# Patient Record
Sex: Female | Born: 1940 | Race: White | Hispanic: No | Marital: Married | Smoking: Never smoker
Health system: Southern US, Community
[De-identification: ages and names within clinical notes are randomized; demographics above are authoritative.]

## PROBLEM LIST (undated history)

## (undated) DIAGNOSIS — K219 Gastro-esophageal reflux disease without esophagitis: Secondary | ICD-10-CM

## (undated) DIAGNOSIS — C801 Malignant (primary) neoplasm, unspecified: Secondary | ICD-10-CM

## (undated) DIAGNOSIS — R55 Syncope and collapse: Secondary | ICD-10-CM

## (undated) DIAGNOSIS — E669 Obesity, unspecified: Secondary | ICD-10-CM

## (undated) DIAGNOSIS — R51 Headache: Secondary | ICD-10-CM

## (undated) DIAGNOSIS — M1990 Unspecified osteoarthritis, unspecified site: Secondary | ICD-10-CM

## (undated) DIAGNOSIS — E039 Hypothyroidism, unspecified: Secondary | ICD-10-CM

## (undated) DIAGNOSIS — I10 Essential (primary) hypertension: Secondary | ICD-10-CM

## (undated) DIAGNOSIS — D649 Anemia, unspecified: Secondary | ICD-10-CM

## (undated) DIAGNOSIS — H9190 Unspecified hearing loss, unspecified ear: Secondary | ICD-10-CM

## (undated) DIAGNOSIS — I2510 Atherosclerotic heart disease of native coronary artery without angina pectoris: Secondary | ICD-10-CM

## (undated) HISTORY — DX: Unspecified osteoarthritis, unspecified site: M19.90

## (undated) HISTORY — PX: CATARACT EXTRACTION, BILATERAL: SHX1313

## (undated) HISTORY — DX: Obesity, unspecified: E66.9

## (undated) HISTORY — DX: Syncope and collapse: R55

## (undated) HISTORY — DX: Headache: R51

## (undated) HISTORY — DX: Hypothyroidism, unspecified: E03.9

## (undated) HISTORY — PX: CARPAL TUNNEL RELEASE: SHX101

## (undated) HISTORY — PX: ABDOMINAL HYSTERECTOMY: SHX81

## (undated) HISTORY — PX: OTHER SURGICAL HISTORY: SHX169

## (undated) HISTORY — PX: TONSILLECTOMY: SUR1361

## (undated) HISTORY — PX: LUMBAR SPINE SURGERY: SHX701

## (undated) HISTORY — PX: CERVICAL SPINE SURGERY: SHX589

---

## 1998-10-26 ENCOUNTER — Other Ambulatory Visit: Admission: RE | Admit: 1998-10-26 | Discharge: 1998-10-26 | Payer: Self-pay | Admitting: Obstetrics and Gynecology

## 1999-08-20 ENCOUNTER — Emergency Department (HOSPITAL_COMMUNITY): Admission: EM | Admit: 1999-08-20 | Discharge: 1999-08-20 | Payer: Self-pay

## 1999-11-01 ENCOUNTER — Other Ambulatory Visit: Admission: RE | Admit: 1999-11-01 | Discharge: 1999-11-01 | Payer: Self-pay | Admitting: Obstetrics and Gynecology

## 2000-11-13 ENCOUNTER — Other Ambulatory Visit: Admission: RE | Admit: 2000-11-13 | Discharge: 2000-11-13 | Payer: Self-pay | Admitting: Obstetrics and Gynecology

## 2001-06-18 ENCOUNTER — Ambulatory Visit (HOSPITAL_COMMUNITY): Admission: RE | Admit: 2001-06-18 | Discharge: 2001-06-18 | Payer: Self-pay | Admitting: Gastroenterology

## 2001-10-25 ENCOUNTER — Encounter: Admission: RE | Admit: 2001-10-25 | Discharge: 2001-11-23 | Payer: Self-pay | Admitting: Neurosurgery

## 2001-11-19 ENCOUNTER — Other Ambulatory Visit: Admission: RE | Admit: 2001-11-19 | Discharge: 2001-11-19 | Payer: Self-pay | Admitting: Obstetrics and Gynecology

## 2001-12-18 ENCOUNTER — Ambulatory Visit (HOSPITAL_COMMUNITY): Admission: RE | Admit: 2001-12-18 | Discharge: 2001-12-18 | Payer: Self-pay | Admitting: Neurosurgery

## 2002-01-24 ENCOUNTER — Observation Stay (HOSPITAL_COMMUNITY): Admission: RE | Admit: 2002-01-24 | Discharge: 2002-01-25 | Payer: Self-pay | Admitting: Neurosurgery

## 2002-10-30 ENCOUNTER — Ambulatory Visit (HOSPITAL_COMMUNITY): Admission: RE | Admit: 2002-10-30 | Discharge: 2002-10-30 | Payer: Self-pay | Admitting: Family Medicine

## 2002-10-30 ENCOUNTER — Encounter: Payer: Self-pay | Admitting: Family Medicine

## 2002-11-13 ENCOUNTER — Encounter: Admission: RE | Admit: 2002-11-13 | Discharge: 2002-11-13 | Payer: Self-pay | Admitting: Specialist

## 2002-11-13 ENCOUNTER — Encounter: Payer: Self-pay | Admitting: Specialist

## 2004-07-21 ENCOUNTER — Encounter: Admission: RE | Admit: 2004-07-21 | Discharge: 2004-07-21 | Payer: Self-pay | Admitting: Specialist

## 2004-09-06 ENCOUNTER — Ambulatory Visit (HOSPITAL_BASED_OUTPATIENT_CLINIC_OR_DEPARTMENT_OTHER): Admission: RE | Admit: 2004-09-06 | Discharge: 2004-09-06 | Payer: Self-pay | Admitting: Specialist

## 2004-09-06 ENCOUNTER — Encounter (INDEPENDENT_AMBULATORY_CARE_PROVIDER_SITE_OTHER): Payer: Self-pay | Admitting: *Deleted

## 2004-09-06 ENCOUNTER — Ambulatory Visit (HOSPITAL_COMMUNITY): Admission: RE | Admit: 2004-09-06 | Discharge: 2004-09-06 | Payer: Self-pay | Admitting: Specialist

## 2005-04-13 ENCOUNTER — Inpatient Hospital Stay (HOSPITAL_COMMUNITY): Admission: EM | Admit: 2005-04-13 | Discharge: 2005-04-16 | Payer: Self-pay | Admitting: Emergency Medicine

## 2005-04-14 ENCOUNTER — Encounter (INDEPENDENT_AMBULATORY_CARE_PROVIDER_SITE_OTHER): Payer: Self-pay | Admitting: Specialist

## 2005-04-16 ENCOUNTER — Ambulatory Visit: Payer: Self-pay | Admitting: Internal Medicine

## 2007-10-18 ENCOUNTER — Ambulatory Visit (HOSPITAL_COMMUNITY): Admission: RE | Admit: 2007-10-18 | Discharge: 2007-10-18 | Payer: Self-pay | Admitting: Neurosurgery

## 2008-02-25 ENCOUNTER — Inpatient Hospital Stay (HOSPITAL_COMMUNITY): Admission: RE | Admit: 2008-02-25 | Discharge: 2008-02-27 | Payer: Self-pay | Admitting: Neurosurgery

## 2008-12-11 ENCOUNTER — Encounter: Admission: RE | Admit: 2008-12-11 | Discharge: 2008-12-11 | Payer: Self-pay | Admitting: Neurosurgery

## 2009-06-24 ENCOUNTER — Inpatient Hospital Stay (HOSPITAL_COMMUNITY): Admission: RE | Admit: 2009-06-24 | Discharge: 2009-06-27 | Payer: Self-pay | Admitting: Neurosurgery

## 2011-02-02 ENCOUNTER — Other Ambulatory Visit: Payer: Self-pay | Admitting: Obstetrics & Gynecology

## 2011-03-13 LAB — CARDIAC PANEL(CRET KIN+CKTOT+MB+TROPI)
CK, MB: 3.3 ng/mL (ref 0.3–4.0)
CK, MB: 3.5 ng/mL (ref 0.3–4.0)
CK, MB: 8.7 ng/mL — ABNORMAL HIGH (ref 0.3–4.0)
Relative Index: 0.7 (ref 0.0–2.5)
Total CK: 444 U/L — ABNORMAL HIGH (ref 7–177)
Total CK: 495 U/L — ABNORMAL HIGH (ref 7–177)
Troponin I: 0.01 ng/mL (ref 0.00–0.06)
Troponin I: 0.03 ng/mL (ref 0.00–0.06)
Troponin I: 0.03 ng/mL (ref 0.00–0.06)

## 2011-03-13 LAB — BASIC METABOLIC PANEL
CO2: 27 mEq/L (ref 19–32)
Calcium: 7.7 mg/dL — ABNORMAL LOW (ref 8.4–10.5)
Chloride: 108 mEq/L (ref 96–112)
Creatinine, Ser: 0.67 mg/dL (ref 0.4–1.2)
GFR calc Af Amer: >60 mL/min (ref >60)
Glucose, Bld: 123 mg/dL — ABNORMAL HIGH (ref 70–99)

## 2011-03-13 LAB — CBC
HCT: 32.2 % — ABNORMAL LOW (ref 36.0–46.0)
MCV: 91 fL (ref 78.0–100.0)
Platelets: 129 10*3/uL — ABNORMAL LOW (ref 150–400)
RBC: 3.54 MIL/uL — ABNORMAL LOW (ref 3.87–5.11)
WBC: 8.9 10*3/uL (ref 4.0–10.5)

## 2011-03-14 LAB — TYPE AND SCREEN

## 2011-03-14 LAB — BASIC METABOLIC PANEL
BUN: 9 mg/dL (ref 6–23)
Calcium: 9.1 mg/dL (ref 8.4–10.5)
GFR calc Af Amer: >60 mL/min (ref >60)
GFR calc non Af Amer: >60 mL/min (ref >60)
Potassium: 4.6 mEq/L (ref 3.5–5.1)
Sodium: 139 mEq/L (ref 135–145)

## 2011-03-14 LAB — CBC
HCT: 41.9 % (ref 36.0–46.0)
Platelets: 178 10*3/uL (ref 150–400)

## 2011-04-19 NOTE — Op Note (Signed)
[name], [name]             ACCOUNT NO.:  [id number]   MEDICAL RECORD NO.:  [id number]          PATIENT TYPE:  INP   LOCATION:  3003                         FACILITY:  [facility]   PHYSICIAN:  Cristi Loron, M.D.DATE OF BIRTH:  Nov 06, 1941   DATE OF PROCEDURE:  02/25/2008  DATE OF DISCHARGE:                               OPERATIVE REPORT   BRIEF HISTORY:  The patient is a 70 year old white female who suffered  from back and leg pain consistent with neurogenic claudication.  She has  failed medical management, worked up with lumbar MRI and a lumbar myelo-  CT which demonstrated she had spinal stenosis, scoliosis,  spondylolisthesis, etc. She failed medical management.  I discussed the  various treatment options with her including surgery.  The patient has  weighed the risks, benefits, and alternatives of the surgery and decided  to proceed with a lumbar decompression and fusion.   PREOPERATIVE DIAGNOSES:  Lumbar spinal stenosis, disk degeneration,  scoliosis, spondylolisthesis, lumbar radiculopathy/myelopathy, lumbago.   POSTOPERATIVE DIAGNOSES:  Lumbar spinal stenosis, disk degeneration,  scoliosis, spondylolisthesis, lumbar radiculopathy/myelopathy, lumbago.   PROCEDURE:  L3 and L4  laminectomy with two laminotomies to decompress  the bilateral L3, L4, and L5 nerve roots using a microdissection.   SURGEON:  Cristi Loron, M.D.   ASSISTANT:  Hewitt Shorts, M.D.   ANESTHESIA:  General endotracheal.   ESTIMATED BLOOD LOSS:  100 cc.   SPECIMENS:  None.   DRAINS:  None.   COMPLICATIONS:  None.   DESCRIPTION OF PROCEDURE:  The patient was brought to the operating room  by anesthesia team.  General endotracheal anesthesia was induced.  The  patient was turned to the prone position on the Wilson frame.  Her  lumbosacral region was then prepared with Betadine scrub and Betadine  solution.  Sterile drapes were applied.  I then injected the area to be  incised  with Marcaine without epinephrine solution.  I used a scalpel to  make a linear midline incision over the L2-3, L3-4, L4-5  interspaces.  I used electrocautery to perform a bilateral subperiosteal dissection  exposing the spinous process lamina of L2, L3, L4, and L5.  We obtained  intraoperative radiograph to confirm our location and then inserted the  [facility] retractor for exposure.  We then incised  the interspinous  ligament at L2-3, L3-4, L4-5 with scalpel.  We used a Leksell rongeur to  remove the L3 and L4 spinous process.  We then brought the operative  microscope into the field and under its magnification and illumination  we completed the microdissection/decompression.  We used a high-speed  drill to perform bilateral L4, L3, and L2 laminotomies.  We widened the  laminotomies at L2 with Kerrison punch and completed the L3 and L4  laminectomy using the Kerrison punch.  Also removed the ligament flavum  of L2-3, L3-4, and L4-5.  We encountered the lateral recess stenosis we  expected and we used Kerrison punch to perform to remove the excess  ligamentum flavum from lateral recess and then performed foraminotomies  about the bilateral L3, L4, L5 nerve roots.  We then inspected the  intervertebral disk at L2-3, L3-4, L4-5 and  noted there were bulging,  but we did not see any significant neural compression coming from the  disk.  We then palpated along the ventral surface of the thecal sac and  along the exit route of bilateral L3, L4, L5  nerve roots noting neural  structure well decompressed throughout the neural foramen.  We then  obtained hemostasis using bipolar electrocautery and then irrigated the  wound out with Bacitracin solution, removed the retractor, and then  reapproximated the patient's thoracolumbar fascia with interrupted #1  Vicryl suture, subcutaneous tissue with interrupted 2-0 Vicryl suture,  and the skin with Steri-Strips and Benzoin.  The wound was then  coated  bacitracin ointment and sterile dressing applied.  The drapes were  removed.  The patient was subsequently returned to supine position where  she was extubated by anesthesia team and transported to post anesthesia  care unit in stable condition.  All sponge, instrument, and needle  counts were correct at the end of this case.      Cristi Loron, M.D.  Electronically Signed     JDJ/MEDQ  D:  02/25/2008  T:  02/26/2008  Job:  [id number]

## 2011-04-19 NOTE — Op Note (Signed)
Sarah [name], Sarah [name]             ACCOUNT NO.:  [id number]   MEDICAL RECORD NO.:  [id number]          PATIENT TYPE:  INP   LOCATION:  3011                         FACILITY:  [facility]   PHYSICIAN:  Cristi Loron, M.D.DATE OF BIRTH:  06/02/1941   DATE OF PROCEDURE:  06/24/2009  DATE OF DISCHARGE:                               OPERATIVE REPORT   BRIEF HISTORY:  The patient is a 70 year old white female whom I  performed a decompressive laminectomy several years ago.  The patient  initially did well, but had developed recurrent back and leg pain  consistent with neurogenic claudication.  She failed medical management,  was worked up with a lumbar MRI and myelo CT, which demonstrated the  patient had disk degeneration and spondylosis, foraminal stenosis, etc.  at L2-3, L3-4, and L4-5 with a spondylolisthesis at L4-5.  I discussed  the various treatment options with the patient including surgery.  The  patient has weighed the risks, benefits, and alternatives of the surgery  and decided to proceed with a lumbar decompression, instrumentation, and  fusion.   PREOPERATIVE DIAGNOSES:  L2-3, L3-4, and L4-5 disk degeneration,  spondylosis, stenosis, spondylolisthesis, lumbago, lumbar radiculopathy,  scoliosis.   POSTOPERATIVE DIAGNOSES:  L2-3, L3-4, and L4-5 disk degeneration,  spondylosis, stenosis, spondylolisthesis, lumbago, lumbar radiculopathy,  scoliosis.   PROCEDURE:  1. Redo decompressive laminectomy at L2-3, L3-4, and L4-5.  2. Decompression of bilateral L2, L3, L4, and L5 nerve roots (the work      in order to decompress these segments was in excess of the work      required to do the posterior lumbar interbody fusion because of the      patient's foraminal stenosis, disk degeneration etc).  3. L2-3, L3-4, L4-5 posterior lumbar interbody fusion with local      morselized autograft bone and Actifuse bone graft extender;      insertion of L2-3, L3-4, L4-5 interbody prosthesis  (Capstone PEEK      interbody prosthesis); L2 through L5 posterior segmental      instrumentation with legacy titanium pedicle screws and rods; L2-3,      L3-4, and L4-5 posterior lateral arthrodesis with local morselized      autograft bone and Vitoss bone graft extender.   SURGEON:  Cristi Loron, MD   ASSISTANT:  Payton Doughty, MD   ANESTHESIA:  General endotracheal.   ESTIMATED BLOOD LOSS:  500 mL.   SPECIMENS:  None.   DRAINS:  None.   COMPLICATIONS:  Durotomy.   DESCRIPTION OF PROCEDURE:  The patient was brought to the operating room  by the Anesthesia team.  General endotracheal anesthesia was induced.  The patient was turned to the prone position on the Wilson frame.  The  lumbosacral region was then prepared with Betadine scrub and Betadine  solution.  I then inspected the area to be incised with Marcaine with  epinephrine solution.  I used a scalpel to make a linear midline  incision through the patient's previous scar.  I extended the incision  in both the cephalad and caudal direction.  I used electrocautery to  perform a bilateral subperiosteal dissection exposing spinous process  and lamina from L2 down to the upper sacrum.  We obtained intraoperative  radiograph to confirm our location and inserted a Versatrac retractor  for exposure.   Because of the patient's severe central stenosis as well as foraminal  stenosis, a wide lateral decompression was required in excess of what  was required to the posterior lumbar interbody fusion.  We began the  decompression by using a high-speed drill to expose the remaining lamina  at L2, L3, L4, and L5.  We carefully dissected the epidural fibrosis  away from the dura and then we performed a medial facetectomy at L2-3,  L3-4, L4-5 removing the inferior facet at these levels.  We performed a  wide foraminotomy about the bilateral L2, L3, L4, and L5 nerve roots and  decompressed the lateral recess as well.  This completed  the  decompression.   We now turned our attention to posterior lumbar interbody fusion.  We  incised the L2-3, L3-4, L4-5  intervertebral disks bilaterally with a 15-  blade scalpel.  We performed a partial intervertebral diskectomy with a  pituitary forceps.  We prepared the vertebral endplates for the fusion  by removing the soft tissue using the curettes.  We then used trial  spacers and determined using 8 x 26-mm prosthesis bilaterally at L2-3,  L3-4 and 10 x 22 bilaterally and L4-5.  We prefilled these prosthesis  with combination of local morselized autograft bone we obtained during  the decompression as well as Actifuse bone graft extender.  We inserted  the prosthesis into the interspaces of L2-3, L3-4, and L4-5.  There was  a good snug fit.  We filled in remainder of the interspace with local  morselized autograft bone and Actifuse bone graft extender.  This  completed the posterior lumbar interbody fusion.   We now turned our attention through the instrumentation.  Under  fluoroscopic guidance, we cannulated the bilateral L2, L3, L4, and L5  pedicles with the bone probe.  We then tapped the pedicles with a 5.5-mm  tap.  We then removed the tap and probed the tapped pedicles with a ball  probe as well as cortical breeches.  We then inserted a 6.5 x 50-mm  screws into the bilateral L2, L3, L4, and L5 pedicles under fluoroscopic  guidance.  We then palpated along the medial aspect of the pedicles  around the cortical breeches.  None were noted.  We then connected the  unilateral pedicle screws with a lordotic rod.  We compressed the  construct and then secured the rod in place with the caps, which we  tightened appropriately.  We then placed a cross connector between the 2  rods completing the instrumentation.   We now turned our attention to the posterolateral arthrodesis.  We used  a high-speed drill to decorticate the remainder of the decorticated  bilateral L3, L4, ad L5  transverse processes, pars, etc. and laid a  combination of local morselized autograft bone as well as Vitoss bone  graft extender over the posterolateral structures completing the  posterolateral arthrodesis.   We then obtained hemostasis using bipolar electrocautery.  We irrigated  the wound out with bacitracin solution.  I should also mention that we  used bone morphogenic protein-soaked collagen sponges in the interspace  to autograft the posterior lumbar interbody fusion.  I should also  mention that we encountered a small durotomy over the midline dura on  the right L4-5.  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  We did see there was  minimal spinal fluid leakage.  We covered this with a small piece of  Gelfoam and placed the DuraSeal over this and got a good seal.   We removed the retractors and then reapproximated the patient's  thoracolumbar fascia with interrupted #1 Vicryl suture, subcutaneous  tissue with interrupted 2-0 Vicryl suture, and the skin with Steri-  Strips and Benzoin.  The wound was then coated with bacitracin ointment  and sterile dressing was applied.  The drapes were removed, and  the patient was subsequently returned to the supine position where she  was extubated by the Anesthesia team and transported to the post  anesthesia care unit in stable condition.  All sponge, instrument, and  needle counts were correct at the end of this case.      Cristi Loron, M.D.  Electronically Signed     JDJ/MEDQ  D:  06/24/2009  T:  06/25/2009  Job:  [id number]

## 2011-04-19 NOTE — Discharge Summary (Signed)
Sarah, [name]             ACCOUNT NO.:  [id number]   MEDICAL RECORD NO.:  [id number]          PATIENT TYPE:  INP   LOCATION:  3011                         FACILITY:  [facility]   PHYSICIAN:  Cristi Loron, M.D.DATE OF BIRTH:  June 01, 1941   DATE OF ADMISSION:  06/24/2009  DATE OF DISCHARGE:  06/27/2009                               DISCHARGE SUMMARY   BRIEF HISTORY:  The patient is a 70 year old white female who I  performed a decompressive laminectomy on several years ago.  The patient  initially did well, but has developed recurrent back and leg pain  consistent with neurogenic claudication.  She failed medical management  and workup with a lumbar MRI and myelo-CT which demonstrated the patient  had degeneration, spondylosis, foraminal stenosis, scoliosis, etc., at  L2-3, L3-4, and L4-5 with a spondylolisthesis at L4-5.  I discussed the  various treatment options with the patient including surgery.  The  patient has weighed the risks, benefits, and alternatives of surgery and  decided to proceed with a lumbar decompression, instrumentation, and  fusion.   For full details of this admission please, refer to typed history and  physical.   HOSPITAL COURSE:  On the day of admission, I performed L2-3, L3-4, and  L4-5 decompression, instrumentation, and fusion.  The surgery went well  (for full details of that operation, please refer to operative note).   The patient's postoperative course was unremarkable and she is  discharged home on postoperative day #3.   DISCHARGE INSTRUCTIONS:  The patient was given written discharge  instruction for followup in 4 weeks.   DISCHARGE PRESCRIPTIONS:  1. Percocet 10/325, #100, 1 p.o. q.4 h. p.r.n. for pain, no refill.  2. Valium 5 mg, #50, 1 p.o. q.6 h. p.r.n. for muscle spasms, no      refill.   FINAL DIAGNOSES:  L2-3, L3-4, and L4-5 degeneration, spondylosis,  stenosis, spondylolisthesis, scoliosis, lumbago, and lumbar  radiculopathy.   PROCEDURE PERFORMED:  Redo decompressive laminectomy at L2-L3, L3-4, and  L4-5; decompression of the bilateral L2, L3, L4, and L5 nerve roots (the  work in order to decompress these segments was an extensive of the work  required to do the posterior lumbar interbody fusion because of the  patient's foraminal stenosis, degeneration, spondylolisthesis,  scoliosis, etc.); L2-3, L3-4, and L4-5 posterior lumbar interbody fusion  with local morselized autograft bone and Actifuse bone graft extender;  insertion of L2-3, L3-4, and L4-5 interbody prosthesis (capsule PEEK  interbody prosthesis); L2-L5 posterior segmental instrumentation with  Legacy titanium pedicle screws and rods; L2-3, L3-4, and L4-5  posterolateral arthrodesis with local morselized autograft bone and  Vitoss bone graft extender.     Cristi Loron, M.D.  Electronically Signed    JDJ/MEDQ  D:  06/27/2009  T:  06/28/2009  Job:  [id number]

## 2011-04-22 NOTE — Op Note (Signed)
[facility]  Patient:    Sarah [name], Sarah [name] Visit Number: [id number] MRN: [id number]          Service Type: SUR Location: 3000 3005 01 Attending Physician:  Cristi Loron Dictated by:   Cristi Loron, M.D. Proc. Date: 01/24/02 Admit Date:  01/24/2002 Discharge Date: 01/25/2002                             Operative Report  PREOPERATIVE DIAGNOSIS:  C5-6 and C6-7 spondylosis, degenerative disease, spinal stenosis, cervicalgia, and cervical radiculopathy.  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  PROCEDURE:  C5-6 and C6-7 extensive anterior cervical diskectomy, interbody iliac crest allograft arthrodesis, anterior cervical plating (Codman titanium plate and screw).  SURGEON:  Cristi Loron, M.D.  ASSISTANT:  Tanya Nones. Jeral Fruit, M.D.  ANESTHESIA:  General endotracheal.  ESTIMATED BLOOD LOSS:  150 cc.  SPECIMENS:  None.  DRAINS:  None.  COMPLICATIONS:  None.  BRIEF HISTORY:  The patient is a 70 year old white female who has suffered from neck and arm pain.  She failed medical management and worked up as an outpatient with a cervical MRI and cervical myelogram CT which demonstrated spondylosis and stenosis of C5-6 and C6-7.  The patient had failed medical management and therefore weighed the risks, benefits, and alternatives of surgery, and decided to proceed with an anterior cervical diskectomy with fusion and plating.  DESCRIPTION OF PROCEDURE:  The patient was brought to the operating room by the anesthesia team.  General endotracheal anesthesia was induced.  Remained in the supine position and a roll was placed under her shoulders, placing the neck in slight extension.  The anterior cervical region was then prepared with Betadine scrub and Betadine solution.  Sterile drapes were applied.  I then injected the area to be incised  with Marcaine with epinephrine solution, and used a scalpel to make a transverse incision in the patients left anterior neck.  I used the Metzenbaum scissors to divide the platysma muscle and dissect medial to the sternocleidomastoid muscle, jugular vein, and carotid artery.  I bluntly dissected down towards the anterior cervical spine, carefully identifying the esophagus and retracting it medially.  I used Kitner swabs to clear the soft tissue from the anterior cervical spine.  I inserted a bent spinal needle into the upper exposed interspace.  I then obtained an intraoperative radiograph to confirm my location.  I then used electrocautery to detach the medial border of the longus colli muscles bilaterally from the C5-6 and the C6-7 intervertebral disk space.  I then inserted the Caspar self-retaining retractor for exposure.  I then incised the C5-6 intervertebral disk and performed a partial diskectomy using the pituitary forceps.  I inserted distraction screws in C5 and C6, and this distracted the interspace.  I then used the high speed drill to decorticate the vertebral endplates at C5-6 and drill away the remainder of the intervertebral disk and then up the posterior longitudinal ligament.  I incised the ligament with the arachnoid knife and then removed it with the Kerrison punch, undercutting the vertebral endplates at C5-6, decompressing the thecal sac.  I then performed a foraminotomy about the bilateral C6 nerve root.  I then repeated the procedure at C6-7, i.e. I removed the distraction screw from C5, placed it into C7, distracted the C6-7 interspace, and incised the intervertebral disk, and performed partial diskectomy with the  pituitary forceps and Carlens curets.  I used the high speed drill to decorticate the vertebral endplates of C6-7 and to drill away the remainder of the intervertebral disk and thin out the posterior longitudinal ligament.  I incised the ligament with  an arachnoid knife and removed it with Kerrison punch, undercutting the vertebral endplates of C6-7, decompressing the thecal sac.  I performed a foraminotomy about the bilateral C7 nerve root.  There was considerably more spondylosis at this level than at C5-6.  Having completed the decompression, now I turned my attention to anterior interbody arthrodesis.  I obtained iliac crest bicortical allograft bone graft and fashioned it to these approximate dimensions; 6 mm in height, 1 cm in depth, and inserted one bone graft into the distracted C6-7 interspace, and removed the distraction screws from C7 and placed it back at C5, distracted the C5-6 interspace, and placed a second bone graft into the C5-6 interspace. I removed the distraction screws and there was a good snug fit of bone graft at both levels.  I now turned my attention to spinal instrumentation.  I obtained the appropriate length Codman anterior cervical plate, laid it along the anterior aspect of the vertebral bodies from C5 to C7, drilled two holes at C5, two at C6, two at C7.  Tapped the holes and secured the plate to the vertebral bodies with two 12 mm screws into each vertebral body.  I then obtained an intraoperative radiograph that demonstrated good position of the plate, screws, and interbody graft (with visualization of the lower screws, but had a a good in vivo).  I then secured the screws to the plate using the Cam tightener at each screw.  I then achieved strenuous hemostasis using bipolar electrocautery.  I copiously irrigated the wound out with Bacitracin solution and removed the solution.  I then removed the Caspar self-retaining retractor. I inspected the esophagus for any damage and there was none apparent.  I then reapproximated the patients platysmal muscle with interrupted 3-0 Vicryl suture, and the subcutaneous tissue with interrupted 0 Vicryl suture.  The skin was reapproximated using Benzoin and  Steri-Strips, and then was coated with Bacitracin ointment.  A sterile dressing was applied.  The drapes were removed and the patient was  subsequently extubated by the anesthesia team and transported to the postanesthesia care unit in stable condition.  All sponge, instrument, and needle counts were correct at the end of the case. Dictated by:   Cristi Loron, M.D. Attending Physician:  Tressie Stalker D DD:  01/24/02 TD:  01/25/02 Job: [id number]

## 2011-04-22 NOTE — H&P (Signed)
Sarah [name], FAIT             ACCOUNT NO.:  [id number]   MEDICAL RECORD NO.:  [id number]          PATIENT TYPE:  EMS   LOCATION:  MAJO                         FACILITY:  [facility]   PHYSICIAN:  Sarah [name], M.D.DATE OF BIRTH:  [birth year]   DATE OF ADMISSION:  04/13/2005  DATE OF DISCHARGE:                                HISTORY & PHYSICAL   PRIMARY CARE PHYSICIAN:  Sarah [name], M.D.   This is a 70 year old Caucasian female, past medical history significant for  longstanding arthritis, who presents with less than 24-hour period history  of hematochezia and also has abdominal pain associated with this.  She  denies any previous history of similar symptoms.  She does state that over a  decade ago, she had an endoscopy procedure, done by Dr. Randa [name], for  hemoptysis/hematemesis at that time.  She states that evaluation's only  findings were inflammation.  Currently, she denies any fevers, chills, and  no sick contacts.  The patient states, after further questioning, that her  primary care physician, Dr. [name], had done a colonoscopy approximately a  year ago with no abnormal findings.  The patient does complain of some  slight lightheadedness since the onset of symptoms but has not had any  syncopal events.  She does complain of lethargy and overall malaise since  the onset of symptoms.  No travel history.   PAST MEDICAL HISTORY:  1.  History of hematemesis/hemoptysis (trying to obtain records), apparently      had a endoscopy procedure that was positive only for some inflammation,      per the patient.  2.  History of arthritis.   SURGICAL HISTORY:  1.  Hysterectomy.  2.  Cholecystectomy but still has appendix.   MEDICATIONS:  1.  Mobic one tablet daily or every other day.  2.  Estratest h.s. 0.625/1.25 every day.   FAMILY HISTORY:  Noncontributory.   SOCIAL HISTORY:  Denies tobacco, alcohol, or illicit drug use.   REVIEW OF SYSTEMS:  No weight changes, appetite  changes, no fevers, chills,  cough, congestion, no GU abnormalities.  The rest of the review of systems  is negative, except for HPI.   PHYSICAL EXAMINATION:  VITAL SIGNS:  Temperature is 98.5, blood pressure  141/72, pulse of 88, respirations 18, 92% on room air.  GENERAL:  This is an obese Caucasian female with mild distress.  HEENT:  Pupils are equal, round, and reactive to light.  Extraocular  movements are intact.  She has dry mucous membranes.  Posterior oropharynx  is clean without erythema or exudate.  NECK:  Supple.  No JVP.  No masses.  No bruits.  CARDIOVASCULAR:  Regular rate and rhythm.  No murmurs, rubs, or gallops.  CHEST:  Clear with good breath sounds.  ABDOMEN:  Has significant epigastric tenderness but no rebound, no rigidity,  and no guarding.  Bowel sounds are present and diminished.  EXTREMITIES:  Without clubbing, cyanosis, or edema.  NEUROLOGIC:  Cranial nerves:  Alert and oriented x 3.  Cranial nerves II-XII  are intact.  No focal strength or sensation deficits.   LABS:  She has a white count of 19.8, hemoglobin 17.3, platelets of 166  normocytic.  C-MET shows a sodium of 136, potassium 3.8, chloride 108,  bicarb 20, glucose of 148, BUN of 13, creatinine of 0.8, calcium 8.3.  Her  LFTs are normal.   ASSESSMENT:  1.  Hematochezia, hematemesis.  2.  Arthritis.  3.  Deep vein thrombosis prophylaxis.   PLAN:  1.  We will admit and place on IV PPI.  2.  We will avoid aspirin and NSAIDs during this admission to include her      Mobic.  3.  We will also hold her Estratest.  4.  We will check a lipase, follow her H&H q.12h., and check orthostatics.  5.  Aggressive IV fluid hydration.  6.  Symptomatic treatment.  7.  I have consulted Dr. Loreta Ave as a GI consultant for a probable endoscopy.  8.  PAS hose for DVT prophylaxis and then we will follow her labs in the      morning.      JD/MEDQ  D:  04/13/2005  T:  04/13/2005  Job:  [id number]   cc:   Sarah [name],  M.D.  [address]  [place]  [place] [address]  Fax: [phone]   Anselmo Rod, M.D.  [address]  [address]  [place]  [place] [address]  Fax: [phone]

## 2011-04-22 NOTE — Consult Note (Signed)
[name], Sarah [name] [name]             ACCOUNT NO.:  [id number]   MEDICAL RECORD NO.:  [id number]          PATIENT TYPE:  INP   LOCATION:  3024                         FACILITY:  [facility]   PHYSICIAN:  Sarah [name] [name], M.D.  DATE OF BIRTH:  03/09/1941   DATE OF CONSULTATION:  04/13/2005  DATE OF DISCHARGE:                                   CONSULTATION   REASON FOR CONSULTATION:  Vomiting blood and rectal bleeding.   ASSESSMENT:  1.  Hematemesis with hematochezia in a 70 year old white female on      nonsteroidals for arthritis (patient takes Mobic on a daily bases.  The      patient gives a history of melena for the last one week; rule out peptic      ulcer disease.  2.  Gastroesophageal reflux disease.  3.  Arthritis on Mobic.  4.  Family history of colon cancer in the maternal grandfather and sister.  5.  Three normal vaginal deliveries in the remote past.  6.  Partial vaginal hysterectomy.   RECOMMENDATIONS:  1.  Esophagogastroduodenoscopy today.  2.  Continue serial complete blood counts.  3.  Type and cross if hemoglobin drops below 10 grams/deciliter.  4.  Discontinue all nonsteroidals including Mobic.   DISCUSSION:  Sarah [name] [name] is a pleasant 70 year old white female  who was in her usual state of health until about a week ago when she started  experiencing some diffuse abdominal pain especially in the epigastric and  right lower quadrant areas associated with some black stools.  Her symptoms  worsened yesterday when she had some bright red bleeding and a couple bouts  of hematemesis.  She has epigastric pain with nausea.  She denies any  syncope or near syncopal events after that.  Her appetite is good and weight  has been stable.  There is no history of fever, chills, rigors, chest pain,  shortness of breath, or genitourinary complaints.   ALLERGIES:  PENICILLIN.   MEDICATIONS:  Mobic and Estratest.   PAST MEDICAL HISTORY:  Please see the list above.   SOCIAL HISTORY:  The patient is retired from [employer].  She is married  with three grown children.  She denies the use of alcohol, tobacco or drugs.   FAMILY HISTORY:  The patient's mother died of complications of Parkinson's.  Her father died of complications of Alzheimer's and pneumonia.  Her sister  was diagnosed with colon cancer in her 14s and the maternal grandfather was  diagnosed with colon cancer in his 74s.   REVIEW OF SYSTEMS:  1.  Hematemesis.  2.  Hematochezia.  3.  Epigastric and right lower quadrant pain.  4.  No syncope or near syncopal events.   PHYSICAL EXAMINATION:  GENERAL APPEARANCE:  The general physical examination  reveals a very pleasant and cooperative older female in no acute distress.  VITAL SIGNS:  Blood pressure 141/72, pulse 88/minute, respiratory rate 18  and temperature 98.5.  HEENT:  Atraumatic and normocephalic.  Oropharyngeal mucosa without exudate.  NECK:  The neck is supple.  No JVD, thyromegaly or lymphadenopathy.  CHEST:  The chest is clear to auscultation.  HEART:  S1 and S2 regular.  No murmur, rub or gallop.  LUNGS:  No rubs or wheezing.  ABDOMEN:  The abdomen is soft with normal bowel sounds.  There is epigastric  and right lower quadrant tenderness on palpation with guarding.  No abdomen  scars are noted.  RECTAL:  The rectal examination reveals moderate sphincter tone with old  blood on the examining finger.  No masses are appreciated.   LABORATORY DATA:  Laboratory evaluation revealed a white count of 19.8 with  a hemoglobin of 17.3 and MCV 89.1, hematocrit 50.1 and platelets 166,000.  Sodium 136, potassium 3.8, chloride 108, CO2 20, glucose 148, BUN 13, and  creatinine 0.8.  Calcium 8.3, total protein 6, albumin 3.46m AST 18, and ALT  18.  Alk phos 69 and total bili 1.2.   PLAN:  The plans are as above.  Further recommendations will be made for  follow up.  The patient has strongly been advised to refrain from the use of  all  nonsteroidals for now.      JNM/MEDQ  D:  04/13/2005  T:  04/14/2005  Job:  [id number]   cc:   Sarah [name] [name]. Sarah [name] [name], M.D.  [address]  [place]  [place] [address]  Fax: [phone]   Sarah [name] [name], M.D.

## 2011-04-22 NOTE — Op Note (Signed)
NAMEDIONNE, KNOOP             ACCOUNT NO.:  [id number]   MEDICAL RECORD NO.:  [id number]          PATIENT TYPE:  AMB   LOCATION:  DSC                          FACILITY:  [facility]   PHYSICIAN:  Earvin Hansen L. Truesdale, M.D.DATE OF BIRTH:  [birth year]   DATE OF PROCEDURE:  09/06/2004  DATE OF DISCHARGE:                                 OPERATIVE REPORT   INDICATION FOR PROCEDURE:  [age]-year-old lady with mass effect  involving her left forehead and brow area, previous trauma to the area.  The  area has gotten larger over the past and has caused increased pain and  tenderness on light palpation.   PROCEDURES DONE:  Exploration of area, excision of mass deep under the  periosteum and plastic reconstruction of scar area.   SURGEON:  Yaakov Guthrie. Shon Hough, M.D.   ANESTHESIA:  General.   DESCRIPTION OF PROCEDURE:  The patient underwent general anesthesia and  intubated orally.  Prep was done to the face and neck areas in the usual  fashion with Hibiclens soaping solution and walled off with sterile towels  and drapes so as to make a sterile field.  Prep around the eye was done with  saline.  The mass was drawn out and then local anesthesia was injected over  the superior part of the left brow with 1% Xylocaine with epinephrine  1:100,000 concentration, a total of 5 mL.  An incision was made at the  superior part of the brow, carried down through skin and subcutaneous tissue  to underlying periosteum.  Dissection was carried over to the mass effect  and using a periosteal elevator, I did open the periosteum, revealing the  mass effect under it, which had a cystic fibrous complexity.  I was able to  remove tissue around this for clean margins.  Hemostasis was maintained with  the Bovie unit of coagulation.  Irrigation was done with saline and after  proper hemostasis, the wound was then re-closed in layers with 3-0 and 5-0  Monocryl, a subdermal suture of 5-0 Monocryl, then a running  subcuticular  stitch of 3-0 Monocryl.  Quarter-inch Steri-Strips and soft dressings were  applied to all the areas.   ESTIMATED BLOOD LOSS:  Less than 25 mL.   COMPLICATIONS:  None.      Maree Erie   GLT/MEDQ  D:  09/06/2004  T:  09/06/2004  Job:  [id number]

## 2011-04-22 NOTE — Discharge Summary (Signed)
Sarah [name], Sarah [name]             ACCOUNT NO.:  [id number]   MEDICAL RECORD NO.:  [id number]          PATIENT TYPE:  INP   LOCATION:  3024                         FACILITY:  [facility]   PHYSICIAN:  Lonia Blood, M.D.      DATE OF BIRTH:  [birth year]   DATE OF ADMISSION:  04/13/2005  DATE OF DISCHARGE:  04/16/2005                                 DISCHARGE SUMMARY   PRIMARY CARE PHYSICIAN:  Dr. Dara Hoyer.   DISCHARGE DIAGNOSES:  1.  Hematemesis and hematochezia.  2.  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  3.  Arthritis.  4.  Anemia, iron deficiency.  5.  Transient thrombocytopenia.   DISCHARGE MEDICATIONS:  1.  Iron sulfate 325 mg q.h.s.  2.  Nexium 40 mg daily.   DISPOSITION:  Patient is being discharged home.  She is currently stable and  doing fine.  She will have the followup with Dr. Jeani Hawking at [facility].  She will also need to followup with Dr. Dara Hoyer in the  next 2 weeks.   CONSULTATIONS:  Dr. Anselmo Rod and Dr. Jeani Hawking, Gastroenterology.   PROCEDURES PERFORMED:  1.  EGD performed on Apr 13, 2005, by Dr. Anselmo Rod that was      essentially normal.  2.  Colonoscopy performed by Dr. Jeani Hawking on Apr 14, 2005, that shows      severe colitis in the cecum, ascending, transverse and possibly the      proximal descending colon with biopsy taken.   BRIEF HISTORY AND PHYSICAL:  Please refer to dictated history and physical  by Dr. Delilah Shan.  In short, however, this is a 70 year old female with a  history of longstanding arthritis who has been on Mobic for awhile.  The  patient came in secondary to hematemesis and frank hematochezia.  When seen  in the emergency room she was stable except for the symptoms.  All vitals  were stable.  Her hemoglobin was found to be 17.3 with a white count of 19.8  and normal electrolytes.  The patient was subsequently admitted for  management of GI bleed.   HOSPITAL  COURSE:  1.  Hematemesis and hematochezia.  Patient was admitted, started on IV      fluids, typed and cross matched.  PPI was being followed, Protonix was      started at 80 mg IV.  Dr. Loreta Ave was subsequently consulted who went ahead      and did an EGD on the patient the same day.  She was prepped and had a      colonoscopy done the next day by Dr. Jeani Hawking.  The report of the      colonoscopy is indicated above.  The biopsy came back subsequently      indicating that this is probably ischemic in nature.  The results are as      follows:  Random biopsies fragments of colonic mucosa was focal active      mucosal colitis, ulceration nonreactive, regenerative changes,      suggestive of ischemic colitis.  Rectal  biopsy shows benign rectal      mucosa with no evidence of active mucosal inflammation, dysphagia or      malignancy, and the differential diagnosis included end-stage related      mucosal injury and acute self-limiting colitis, although the NSAID      related one was more likely.  Subsequently, patient has been taken off      her Mobic and she is advised to stay away from NSAIDs for awhile.  If      she is going to be on any NSAIDs in the future, it is probably going to      be combined with her PPI.  At the moment, however, she is placed on PPI      and followed with Nexium 40 mg daily.  2.  Arthritis.  The patient's main problem has been arthritis.  She is      currently off her medication.  She may need to be added on some form of      steroid or just use Tylenol in the interim.  In the future, she may need      to combine the NSAIDs with PPI and hopefully that may work well for her.  3.  Anemia.  The patient had drop in her hemoglobin from admission 17 to      11.2.  The initial number may be secondary to hemoccult centration.  She      is currently stable.  For the past two days her hemoglobin has been      around 11.4.  4.  Thrombocytopenia.  Patient had transient  thrombocytopenia with platelets      dropping down to 108 at some point.  She has, however, bounced back      since then, the cause is not clear.  She has not been on any heparin      products and no other cause was identified.  This may be just secondary      to acute illness or acute inflammatory process going on.   DISCHARGE LABS:  Patient's labs include a white count of 6.5, hemoglobin  11.4 and platelets 113 on the day of discharge.      LG/MEDQ  D:  04/16/2005  T:  04/17/2005  Job:  [id number]

## 2011-04-22 NOTE — Procedures (Signed)
[facility]  Patient:    Sarah [name], Sarah [name]                    MRN: [id number] Proc. Date: 06/18/01 Adm. Date:  [id number] Attending:  Orland Mustard CC:         Teena Irani. Arlyce Dice, M.D.   Procedure Report  PROCEDURE:  Colonoscopy.  MEDICATIONS:  Fentanyl 75 mcg, Versed 7.5 mg IV.  SCOPE:  Olympus pediatric video colonoscope.  INDICATIONS:  Strong family history of colon cancer.  DESCRIPTION OF PROCEDURE:  The procedure had been explained to the patient and consent obtained.  With the patient in the left lateral decubitus position, the Olympus pediatric video colonoscope was inserted and advanced under direct visualization.  The prep was excellent.  We were able to advance around to the cecum without difficulty.  The scope was withdrawn.  The cecum, ascending colon, hepatic flexure, transverse colon, splenic flexure, descending, and sigmoid colon were seen well upon removal.  No polyps or other lesions were seen.  The scope was withdrawn.  The patient tolerated the procedure well.  ASSESSMENT:  No polyps or any other abnormalities in this woman with a strong family history of colon cancer.  PLAN: 1. Routine postcolonoscopy instructions. 2. Five year repeat with yearly Hemoccults. DD:  06/18/01 TD:  06/18/01 Job: [id number]

## 2011-04-22 NOTE — Op Note (Signed)
Sarah [name], Sarah [name]             ACCOUNT NO.:  [id number]   MEDICAL RECORD NO.:  [id number]          PATIENT TYPE:  INP   LOCATION:  3024                         FACILITY:  [facility]   PHYSICIAN:  Anselmo Rod, M.D.  DATE OF BIRTH:  Nov 07, 1941   DATE OF PROCEDURE:  04/13/2005  DATE OF DISCHARGE:                                 OPERATIVE REPORT   PROCEDURE PERFORMED:  Esophagogastroduodenoscopy.   ENDOSCOPIST:  Charna Elizabeth, M.D.   INSTRUMENT USED:  Olympus video panendoscope.   INDICATIONS FOR PROCEDURE:  The patient is a 70 year old white female with a  history of Mobic use for arthritis undergoing esophagogastroduodenoscopy for  hematochezia and hematemesis, rule out peptic ulcer disease, esophagitis,  gastritis, etc.   PREPROCEDURE PREPARATION:  Informed consent was procured from the patient.  The patient was fasted for eight hours prior to the procedure.  Vital signs  were closely monitored. The patient was given a bolus of IV fluids prior to  the procedure.   PREPROCEDURE PHYSICAL:  The patient had stable vital signs.  Neck supple,  chest clear to auscultation.  S1, S2 regular.  Abdomen soft with normal  bowel sounds.  Epigastric and right lower quadrant tenderness on palpation  with guarding, no rebound or rigidity, no hepatosplenomegaly.   DESCRIPTION OF PROCEDURE:  The patient was placed in the left lateral  decubitus position and sedated with 20 mg of Demerol and 2 mg of Versed in  slow incremental doses.  Once the patient was adequately sedated and  maintained on low-flow oxygen and continuous cardiac monitoring, the Olympus  video panendoscope was advanced through the mouth piece over the tongue into  the esophagus under direct vision.  The entire esophagus appeared normal  with no evidence of ring, stricture, masses, esophagitis or Barrett's  mucosa.  The scope was then advanced to the stomach.  The entire gastric  mucosa appeared healthy.  Retroflexion to high  cardia revealed no  abnormality.  The proximal small bowel appeared normal as well.   IMPRESSION:  Normal EGD with no evidence of fresh or old blood in the  stomach or proximal small bowel.   RECOMMENDATIONS:  1.  Prep for colonoscopy in the a.m.  2.  Continue serial CBCs.  3.  Agree with Protonix for now.  4.  Discontinue all nonsteroidals including Mobic. Trial of COX2 inhibitors      is recommended.      JNM/MEDQ  D:  04/13/2005  T:  04/14/2005  Job:  [id number]   cc:   Teena Irani. Arlyce Dice, M.D.  [address]  [place]  [place] [address]  Fax: [phone]

## 2011-08-29 LAB — CBC
MCHC: 33.7
WBC: 6

## 2011-08-29 LAB — BASIC METABOLIC PANEL
BUN: 8
CO2: 29
Chloride: 107
GFR calc Af Amer: >60
Glucose, Bld: 94
Potassium: 4.1
Sodium: 139

## 2012-08-02 ENCOUNTER — Other Ambulatory Visit (HOSPITAL_COMMUNITY): Payer: Self-pay | Admitting: Family Medicine

## 2012-08-02 DIAGNOSIS — G459 Transient cerebral ischemic attack, unspecified: Secondary | ICD-10-CM

## 2012-08-03 ENCOUNTER — Ambulatory Visit (HOSPITAL_COMMUNITY)
Admission: RE | Admit: 2012-08-03 | Discharge: 2012-08-03 | Disposition: A | Discharge status: Home / Self Care | Payer: Medicare Other | Source: Ambulatory Visit | Attending: Family Medicine | Admitting: Family Medicine

## 2012-08-03 DIAGNOSIS — R42 Dizziness and giddiness: Secondary | ICD-10-CM | POA: Insufficient documentation

## 2012-08-03 DIAGNOSIS — I6322 Cerebral infarction due to unspecified occlusion or stenosis of basilar arteries: Secondary | ICD-10-CM | POA: Insufficient documentation

## 2012-08-03 DIAGNOSIS — R51 Headache: Secondary | ICD-10-CM | POA: Insufficient documentation

## 2012-08-03 DIAGNOSIS — G459 Transient cerebral ischemic attack, unspecified: Secondary | ICD-10-CM

## 2012-08-03 LAB — CREATININE, SERUM
Creatinine, Ser: 0.86 mg/dL (ref 0.50–1.10)
GFR calc Af Amer: 78 mL/min — ABNORMAL LOW (ref >90)
GFR calc non Af Amer: 67 mL/min — ABNORMAL LOW (ref >90)

## 2012-08-03 LAB — BUN: BUN: 13 mg/dL (ref 6–23)

## 2012-08-03 MED ORDER — IOHEXOL 350 MG/ML SOLN
50.0000 mL | Freq: Once | INTRAVENOUS | Status: AC | PRN
  Administered 2012-08-03: 50 mL via INTRAVENOUS

## 2012-11-05 ENCOUNTER — Other Ambulatory Visit: Payer: Self-pay | Admitting: Neurology

## 2012-11-05 DIAGNOSIS — R55 Syncope and collapse: Secondary | ICD-10-CM

## 2012-11-05 DIAGNOSIS — R51 Headache: Secondary | ICD-10-CM

## 2012-11-13 ENCOUNTER — Ambulatory Visit
Admission: RE | Admit: 2012-11-13 | Discharge: 2012-11-13 | Disposition: A | Discharge status: Home / Self Care | Payer: Medicare Other | Source: Ambulatory Visit | Attending: Neurology | Admitting: Neurology

## 2012-11-13 DIAGNOSIS — R51 Headache: Secondary | ICD-10-CM

## 2012-11-13 DIAGNOSIS — R55 Syncope and collapse: Secondary | ICD-10-CM

## 2012-11-13 MED ORDER — GADOBENATE DIMEGLUMINE 529 MG/ML IV SOLN
16.0000 mL | Freq: Once | INTRAVENOUS | Status: AC | PRN
  Administered 2012-11-13: 16 mL via INTRAVENOUS

## 2013-01-07 DIAGNOSIS — R519 Headache, unspecified: Secondary | ICD-10-CM | POA: Insufficient documentation

## 2013-01-07 DIAGNOSIS — R51 Headache: Secondary | ICD-10-CM | POA: Insufficient documentation

## 2013-01-07 DIAGNOSIS — R55 Syncope and collapse: Secondary | ICD-10-CM | POA: Insufficient documentation

## 2013-03-28 ENCOUNTER — Telehealth: Payer: Self-pay | Admitting: *Deleted

## 2013-03-28 MED ORDER — OXCARBAZEPINE 150 MG PO TABS: ORAL_TABLET | ORAL | Status: DC

## 2013-03-28 NOTE — Telephone Encounter (Signed)
I called patient. The patient had another blackout episode while lying down. The patient was out for a few minutes. The patient has not been able to tolerate a higher dose of Keppra greater than 200 mg, taking one half tablet twice daily. Higher doses cause headaches. I'll give her trial on Trileptal. The patient will stop the Keppra.

## 2013-03-28 NOTE — Telephone Encounter (Signed)
Patient called stating she had another episode on last night of blackouts.

## 2013-04-29 ENCOUNTER — Emergency Department (HOSPITAL_COMMUNITY): Payer: Medicare Other

## 2013-04-29 ENCOUNTER — Emergency Department (HOSPITAL_COMMUNITY)
Admission: EM | Admit: 2013-04-29 | Discharge: 2013-04-29 | Disposition: A | Discharge status: Home / Self Care | Payer: Medicare Other | Attending: Emergency Medicine | Admitting: Emergency Medicine

## 2013-04-29 ENCOUNTER — Encounter (HOSPITAL_COMMUNITY): Payer: Self-pay | Admitting: Emergency Medicine

## 2013-04-29 DIAGNOSIS — M1712 Unilateral primary osteoarthritis, left knee: Secondary | ICD-10-CM

## 2013-04-29 DIAGNOSIS — I10 Essential (primary) hypertension: Secondary | ICD-10-CM | POA: Insufficient documentation

## 2013-04-29 DIAGNOSIS — M1710 Unilateral primary osteoarthritis, unspecified knee: Secondary | ICD-10-CM | POA: Insufficient documentation

## 2013-04-29 HISTORY — DX: Essential (primary) hypertension: I10

## 2013-04-29 MED ORDER — HYDROCODONE-ACETAMINOPHEN 5-325 MG PO TABS: 1.0000 | ORAL_TABLET | Freq: Four times a day (QID) | ORAL | Status: DC | PRN

## 2013-04-29 MED ORDER — HYDROCODONE-ACETAMINOPHEN 5-325 MG PO TABS
1.0000 | ORAL_TABLET | Freq: Once | ORAL | Status: AC
  Administered 2013-04-29: 1 via ORAL
  Filled 2013-04-29: qty 1

## 2013-04-29 NOTE — ED Notes (Signed)
Pt states that she has been having bilateral knee pain x 1 month.  Pt has arthritis and has been seeing her PCP for this.  Is taking meloxicam for pain.

## 2013-04-29 NOTE — ED Provider Notes (Signed)
History     CSN: [id number]  Arrival date & time 04/29/13  4098   First MD Initiated Contact with Patient 04/29/13 [phone]      Chief Complaint  Patient presents with  . Knee Pain    (Consider location/radiation/quality/duration/timing/severity/associated sxs/prior treatment) HPI  Patient is a 72 year old female past medical history significant for hypertension presented to the emergency department for right knee pain that began yesterday afternoon. Patient states pain started when she sat up from a chair and developed sharp non-radiating pain in the anterior and medial portion of her knee. States it was difficult to weight bear and walk. Rates pain 9/10. Denies numbness or tingling. Patient states she has been recently diagnosed with bilateral osteoarthritis and has been working with her primary care doctor regarding pain management and is currently on meloxicam. States meloxicam is not helping increased right knee pain.. Denies fevers, chills, chest pain, shortness of breath.  Past Medical History  Diagnosis Date  . Hypertension     History reviewed. No pertinent past surgical history.  History reviewed. No pertinent family history.  History  Substance Use Topics  . Smoking status: Never Smoker   . Smokeless tobacco: Not on file  . Alcohol Use: No    OB History   Grav Para Term Preterm Abortions TAB SAB Ect Mult Living                  Review of Systems  Constitutional: Negative for fever, chills and diaphoresis.  HENT: Negative for neck pain.   Eyes: Negative for visual disturbance.  Respiratory: Negative for shortness of breath.   Cardiovascular: Negative for chest pain and leg swelling.  Gastrointestinal: Negative for abdominal pain.  Genitourinary: Negative.   Musculoskeletal: Positive for joint swelling.  Skin: Negative for color change, pallor, rash and wound.  Neurological: Negative for numbness and headaches.    Allergies  Diclofenac sodium  Home  Medications   Current Outpatient Rx  Name  Route  Sig  Dispense  Refill  . HYDROcodone-acetaminophen (NORCO/VICODIN) 5-325 MG per tablet   Oral   Take 1 tablet by mouth every 6 (six) hours as needed for pain.   10 tablet   0   . OXcarbazepine (TRILEPTAL) 150 MG tablet      1 tablet twice daily for 2 weeks, and take 2 tablets twice daily   120 tablet   3     BP 140/75  Pulse 75  Temp(Src) 98.5 F (36.9 C) (Oral)  Resp 20  SpO2 99%  Physical Exam  Constitutional: She is oriented to person, place, and time. She appears well-developed and well-nourished. No distress.  HENT:  Head: Normocephalic and atraumatic.  Eyes: Conjunctivae are normal.  Neck: Neck supple.  Musculoskeletal:       Right knee: She exhibits decreased range of motion. She exhibits no ecchymosis, no deformity, no laceration and no erythema. Tenderness found. Medial joint line tenderness noted.       Left knee: Normal.  Neurological: She is alert and oriented to person, place, and time.  Skin: Skin is warm and dry. She is not diaphoretic.  Psychiatric: She has a normal mood and affect.    ED Course  Procedures (including critical care time)  Labs Reviewed - No data to display Dg Knee Complete 4 Views Right  04/29/2013   *RADIOLOGY REPORT*  Clinical Data: Medial knee pain, no known injury  RIGHT KNEE - COMPLETE 4+ VIEW  Comparison: None.  Findings:  No fracture or  dislocation.  No joint effusion.  Mild to moderate tricompartmental degenerative change, worse with the medial compartment with joint space loss, subchondral sclerosis and osteophytosis.  There is minimal spurring of the tibial spines.  No evidence of chondrocalcinosis.  Regional soft tissues are normal.  IMPRESSION: 1.  No acute findings 2.  Mild to moderate tricompartmental degenerative change, worse within the medial compartment.   Original Report Authenticated By: Tacey Ruiz, MD     1. Osteoarthritis of left knee       MDM  Patient  X-Ray negative for obvious fracture or dislocation no degenerative joint changes consistent with osteoarthritis diagnosis. Pain managed in ED. Advised to follow up with PCP in 1-2 days. Pt advised to follow up with orthopedics if symptoms persist. Patient given ace wrap while in ED, conservative therapy recommended along with pain management. Patient will be dc home & is agreeable with above plan.Patient d/w with Dr. Fredderick Phenix, agrees with plan. Patient is stable at time of discharge           Jeannetta Ellis, PA-C 04/29/13 1558

## 2013-05-06 NOTE — ED Provider Notes (Signed)
Medical screening examination/treatment/procedure(s) were performed by non-physician practitioner and as supervising physician I was immediately available for consultation/collaboration.   Sylva Overley, MD 05/06/13 2337 

## 2013-05-13 ENCOUNTER — Ambulatory Visit: Payer: Self-pay | Admitting: Neurology

## 2013-05-15 ENCOUNTER — Ambulatory Visit (INDEPENDENT_AMBULATORY_CARE_PROVIDER_SITE_OTHER): Payer: Medicare Other | Admitting: Neurology

## 2013-05-15 ENCOUNTER — Encounter: Payer: Self-pay | Admitting: Neurology

## 2013-05-15 VITALS — BP 123/70 | HR 81 | Wt 208.0 lb

## 2013-05-15 DIAGNOSIS — R55 Syncope and collapse: Secondary | ICD-10-CM

## 2013-05-15 DIAGNOSIS — R51 Headache: Secondary | ICD-10-CM

## 2013-05-15 NOTE — Progress Notes (Signed)
   Reason for visit: Syncope  Sarah [name] is an 72 y.o. female  History of present illness:  Sarah [name] is a 72 year old right-handed white female with a history of episodes of syncope. The last event was in September 2013. The patient had an episode where she almost had an event while lying down in late April 2014. The patient was placed on low-dose Trileptal, currently on 300 mg twice daily. The patient could not tolerate higher doses of Keppra greater than 250 mg daily due to headaches. The patient has had an abnormal EEG study. The patient returns for an evaluation. The patient is tolerating the Trileptal fairly well.  Past Medical History  Diagnosis Date  . Hypertension   . Syncope, near   . Obesity   . Headache(784.0)   . Hypothyroidism   . Vitamin D deficiency     Past Surgical History  Procedure Laterality Date  . Lumbar spine surgery    . Cervical spine surgery    . Tonsillectomy    . Partial hysterectomy    . Cataract extraction, bilateral    . Carpal tunnel release Bilateral     Family History  Problem Relation Age of Onset  . Alzheimer's disease Sister   . Cancer - Lung Sister   . Heart disease Brother   . Heart disease Brother   . Heart disease Brother   . Cancer Brother     Bladder cancer    Social history:  reports that she has never smoked. She does not have any smokeless tobacco history on file. She reports that she does not drink alcohol or use illicit drugs.  Allergies:  Allergies  Allergen Reactions  . Penicillins   . Voltaren (Diclofenac Sodium) Rash    hives    Medications:  No current outpatient prescriptions on file prior to visit.   No current facility-administered medications on file prior to visit.    ROS:  Out of a complete 14 system review of symptoms, the patient complains only of the following symptoms, and all other reviewed systems are negative.  Near-syncope  Blood pressure 123/70, pulse 81, weight 208 lb (94.348  kg).  Physical Exam  General: The patient is alert and cooperative at the time of the examination. The patient is moderately obese.  Skin: No significant peripheral edema is noted.   Neurologic Exam  Cranial nerves: Facial symmetry is present. Speech is normal, no aphasia or dysarthria is noted. Extraocular movements are full. Visual fields are full.  Motor: The patient has good strength in all 4 extremities.  Coordination: The patient has good finger-nose-finger and heel-to-shin bilaterally.  Gait and station: The patient has a limping gait. Tandem gait is slightly unsteady. Romberg is negative. No drift is seen.  Reflexes: Deep tendon reflexes are symmetric.   Assessment/Plan:  1. Episodes of near-syncope  2. Abnormal EEG  The patient will be maintained on Trileptal for now. The patient appears to be doing relatively well. The patient will be taken off of Keppra. The patient will followup in 6-8 months.  Marlan Palau MD 05/15/2013 7:26 PM  [facility] [address] [place], [place] [address]  Phone [phone] Fax [phone]

## 2013-07-03 HISTORY — PX: OTHER SURGICAL HISTORY: SHX169

## 2013-09-10 ENCOUNTER — Other Ambulatory Visit: Payer: Self-pay | Admitting: Obstetrics and Gynecology

## 2013-09-10 DIAGNOSIS — R928 Other abnormal and inconclusive findings on diagnostic imaging of breast: Secondary | ICD-10-CM

## 2013-10-17 ENCOUNTER — Ambulatory Visit: Payer: Medicare Other | Admitting: Nurse Practitioner

## 2013-10-22 ENCOUNTER — Ambulatory Visit
Admission: RE | Admit: 2013-10-22 | Discharge: 2013-10-22 | Disposition: A | Discharge status: Home / Self Care | Payer: Medicare Other | Source: Ambulatory Visit | Attending: Obstetrics and Gynecology | Admitting: Obstetrics and Gynecology

## 2013-10-22 DIAGNOSIS — R928 Other abnormal and inconclusive findings on diagnostic imaging of breast: Secondary | ICD-10-CM

## 2013-11-14 ENCOUNTER — Ambulatory Visit: Payer: Medicare Other | Admitting: Nurse Practitioner

## 2013-12-10 ENCOUNTER — Encounter (HOSPITAL_COMMUNITY): Payer: Self-pay | Admitting: Pharmacy Technician

## 2013-12-10 ENCOUNTER — Other Ambulatory Visit (HOSPITAL_COMMUNITY): Payer: Self-pay | Admitting: Orthopedic Surgery

## 2013-12-10 NOTE — Patient Instructions (Addendum)
Pablo  12/10/2013   Your procedure is scheduled on: Wednesday January 14th  Report to [facility] at 1200 PM  Call this number if you have problems the morning of surgery [phone]   Remember:   Do not eat food  :After Midnight.   clear liquids midnight night before surgery until 830 am day of surgery, then nothing by mouth.   Take these medicines the morning of surgery with A SIP OF WATER: levothryoxine                                SEE [place] PREPARING FOR SURGERY SHEET             You may not have any metal on your body including hair pins and piercings  Do not wear jewelry, make-up.  Do not wear lotions, powders, or perfumes. You may wear deodorant.   Men may shave face and neck.  Do not bring valuables to the hospital. [place].  Contacts, dentures or bridgework may not be worn into surgery.  Leave suitcase in the car. After surgery it may be brought to your room.  For patients admitted to the hospital, checkout time is 11:00 AM the day of discharge.    Please read over the following fact sheets that you were given: [facility] Preparing for surgery sheet, blood fact sheet, clear liquid sheet, incentive spirometer sheet  Call Zelphia Cairo RN pre op nurse if needed [phone]    [address].  PATIENT SIGNATURE___________________________________________  NURSE SIGNATURE_____________________________________________

## 2013-12-11 ENCOUNTER — Ambulatory Visit (HOSPITAL_COMMUNITY)
Admission: RE | Admit: 2013-12-11 | Discharge: 2013-12-11 | Disposition: A | Discharge status: Home / Self Care | Payer: Medicare Other | Source: Ambulatory Visit | Attending: Surgical | Admitting: Surgical

## 2013-12-11 ENCOUNTER — Encounter (HOSPITAL_COMMUNITY): Payer: Self-pay

## 2013-12-11 ENCOUNTER — Encounter (HOSPITAL_COMMUNITY)
Admission: RE | Admit: 2013-12-11 | Discharge: 2013-12-11 | Disposition: A | Discharge status: Home / Self Care | Payer: Medicare Other | Source: Ambulatory Visit | Attending: Orthopedic Surgery | Admitting: Orthopedic Surgery

## 2013-12-11 DIAGNOSIS — Z01818 Encounter for other preprocedural examination: Secondary | ICD-10-CM | POA: Insufficient documentation

## 2013-12-11 DIAGNOSIS — M1710 Unilateral primary osteoarthritis, unspecified knee: Secondary | ICD-10-CM | POA: Insufficient documentation

## 2013-12-11 DIAGNOSIS — Z01812 Encounter for preprocedural laboratory examination: Secondary | ICD-10-CM | POA: Insufficient documentation

## 2013-12-11 DIAGNOSIS — Z01810 Encounter for preprocedural cardiovascular examination: Secondary | ICD-10-CM | POA: Insufficient documentation

## 2013-12-11 HISTORY — DX: Anemia, unspecified: D64.9

## 2013-12-11 LAB — ABO/RH: ABO/RH(D): O POS

## 2013-12-11 LAB — SURGICAL PCR SCREEN
MRSA, PCR: NEGATIVE
Staphylococcus aureus: NEGATIVE

## 2013-12-11 LAB — COMPREHENSIVE METABOLIC PANEL
ALT: 13 U/L (ref 0–35)
AST: 16 U/L (ref 0–37)
Albumin: 3.4 g/dL — ABNORMAL LOW (ref 3.5–5.2)
Alkaline Phosphatase: 72 U/L (ref 39–117)
BUN: 15 mg/dL (ref 6–23)
CO2: 26 mEq/L (ref 19–32)
Calcium: 9.3 mg/dL (ref 8.4–10.5)
Chloride: 100 mEq/L (ref 96–112)
Creatinine, Ser: 0.68 mg/dL (ref 0.50–1.10)
GFR calc Af Amer: >90 mL/min (ref >90)
GFR calc non Af Amer: 86 mL/min — ABNORMAL LOW (ref >90)
Glucose, Bld: 116 mg/dL — ABNORMAL HIGH (ref 70–99)
Potassium: 4.3 mEq/L (ref 3.7–5.3)
Sodium: 136 mEq/L — ABNORMAL LOW (ref 137–147)
Total Bilirubin: <0.2 mg/dL — ABNORMAL LOW (ref 0.3–1.2)
Total Protein: 6.4 g/dL (ref 6.0–8.3)

## 2013-12-11 LAB — CBC
HCT: 39.6 % (ref 36.0–46.0)
HEMOGLOBIN: 13.3 g/dL (ref 12.0–15.0)
MCH: 29.9 pg (ref 26.0–34.0)
MCHC: 33.6 g/dL (ref 30.0–36.0)
MCV: 89 fL (ref 78.0–100.0)
PLATELETS: 199 10*3/uL (ref 150–400)
RBC: 4.45 MIL/uL (ref 3.87–5.11)
RDW: 12.5 % (ref 11.5–15.5)
WBC: 4.4 10*3/uL (ref 4.0–10.5)

## 2013-12-11 LAB — URINALYSIS, ROUTINE W REFLEX MICROSCOPIC
Glucose, UA: NEGATIVE mg/dL
Hgb urine dipstick: NEGATIVE
Ketones, ur: NEGATIVE mg/dL
Leukocytes, UA: NEGATIVE
Nitrite: NEGATIVE
Protein, ur: NEGATIVE mg/dL
Specific Gravity, Urine: 1.028 (ref 1.005–1.030)
Urobilinogen, UA: 1 mg/dL (ref 0.0–1.0)
pH: 5 (ref 5.0–8.0)

## 2013-12-11 LAB — PROTIME-INR
INR: 0.91 (ref 0.00–1.49)
Prothrombin Time: 12.1 seconds (ref 11.6–15.2)

## 2013-12-11 LAB — APTT: aPTT: 25 seconds (ref 24–37)

## 2013-12-15 NOTE — H&P (Signed)
TOTAL KNEE ADMISSION H&P  Patient is being admitted for right total knee arthroplasty.  Subjective:  Chief Complaint:right knee pain.  HPI: Sarah [name], 73 y.o. female, has a history of pain and functional disability in the right knee due to arthritis and has failed non-surgical conservative treatments for greater than 12 weeks to includeNSAID's and/or analgesics, corticosteriod injections, viscosupplementation injections and activity modification.  Onset of symptoms was gradual, starting 1 year ago with gradually worsening course since that time. The patient noted prior procedures on the knee to include  arthroscopy and menisectomy on the right knee(s).  Patient currently rates pain in the right knee(s) at 7 out of 10 with activity. Patient has night pain, worsening of pain with activity and weight bearing, pain that interferes with activities of daily living, pain with passive range of motion, crepitus and joint swelling.  Patient has evidence of periarticular osteophytes and joint space narrowing by imaging studies.  There is no active infection.  Patient Active Problem List   Diagnosis Date Noted  . Headache(784.0) 01/07/2013  . Syncope and collapse 01/07/2013   Past Medical History  Diagnosis Date  . Hypertension   . Syncope, near 1 year ago  . Obesity   . Hypothyroidism   . Degenerative arthritis   . Anemia   . Headache(784.0)     sinus    Past Surgical History  Procedure Laterality Date  . Lumbar spine surgery    . Cervical spine surgery    . Cataract extraction, bilateral    . Carpal tunnel release Bilateral   . Abdominal hysterectomy      partial  . Tonsillectomy  age 39 or 14  . Arthroscopic knee surgery  July 03, 2013  . Cyst removed from eyebrow  4-5 yrs ago     Current outpatient prescriptions: b complex vitamins tablet, Take 1 tablet by mouth daily., Disp: , Rfl: ;   CALCIUM PO, Take 1 tablet by mouth daily., Disp: , Rfl: ;   Cyanocobalamin (VITAMIN B-12  PO), Take 1 tablet by mouth daily., Disp: , Rfl: ;   estradiol (ESTRACE) 1 MG tablet, Take 1 mg by mouth daily with breakfast. , Disp: , Rfl:  fexofenadine-pseudoephedrine (ALLEGRA-D 24) 180-240 MG per 24 hr tablet, Take 1 tablet by mouth every evening. , Disp: , Rfl: ;   IRON PO, Take 1 tablet by mouth daily., Disp: , Rfl: ;   levothyroxine (SYNTHROID, LEVOTHROID) 75 MCG tablet, Take 75 mcg by mouth every morning. , Disp: , Rfl: ;   losartan (COZAAR) 50 MG tablet, Take 50 mg by mouth daily with breakfast., Disp: , Rfl:   Allergies  Allergen Reactions  . Adhesive [Tape] Swelling    redness  . Other     Band-Aid cause patient to swell  . Oxycodone-Acetaminophen Nausea And Vomiting  . Penicillins Hives  . Voltaren [Diclofenac Sodium] Rash    hives    History  Substance Use Topics  . Smoking status: Never Smoker   . Smokeless tobacco: Never Used  . Alcohol Use: No    Family History  Problem Relation Age of Onset  . Alzheimer's disease Sister   . Cancer - Lung Sister   . Heart disease Brother   . Heart disease Brother   . Heart disease Brother   . Cancer Brother     Bladder cancer     Review of Systems  Constitutional: Negative.   HENT: Negative.   Eyes: Negative.   Respiratory: Negative.  Cardiovascular: Negative.   Gastrointestinal: Negative.   Genitourinary: Negative.   Musculoskeletal: Positive for joint pain. Negative for back pain, falls, myalgias and neck pain.       Right knee pain  Skin: Negative.   Neurological: Negative.   Endo/Heme/Allergies: Negative.   Psychiatric/Behavioral: Negative.     Objective:  Physical Exam  Constitutional: She is oriented to person, place, and time. She appears well-developed and well-nourished. No distress.  HENT:  Head: Normocephalic and atraumatic.  Right Ear: External ear normal.  Left Ear: External ear normal.  Nose: Nose normal.  Mouth/Throat: Oropharynx is clear and moist.  Eyes: Conjunctivae and EOM are normal.   Neck: Normal range of motion. Neck supple.  Cardiovascular: Normal rate, regular rhythm, normal heart sounds and intact distal pulses.   No murmur heard. Respiratory: Effort normal and breath sounds normal. No respiratory distress. She has no wheezes.  GI: Soft. Bowel sounds are normal. She exhibits no distension. There is no tenderness.  Musculoskeletal:       Right hip: Normal.       Left hip: Normal.       Right knee: She exhibits decreased range of motion and swelling. She exhibits no effusion and no erythema. Tenderness found. Medial joint line and lateral joint line tenderness noted.       Left knee: Normal.       Right lower leg: She exhibits no tenderness and no swelling.       Left lower leg: She exhibits no tenderness and no swelling.  Neurological: She is alert and oriented to person, place, and time. She has normal strength and normal reflexes. No sensory deficit.  Skin: No rash noted. She is not diaphoretic. No erythema.  Psychiatric: She has a normal mood and affect. Her behavior is normal.    Vitals Weight: 206 lb Height: 67 in Height was reported by patient. Body Surface Area: 2.1 m Body Mass Index: 32.26 kg/m Pulse: 75 (Regular) BP: 147/64 (Sitting, Left Arm, Standard  Imaging Review Plain radiographs demonstrate severe degenerative joint disease of the right knee(s). The overall alignment ismild varus. The bone quality appears to be good for age and reported activity level.  Assessment/Plan:  End stage arthritis, right knee   The patient history, physical examination, clinical judgment of the provider and imaging studies are consistent with end stage degenerative joint disease of the right knee(s) and total knee arthroplasty is deemed medically necessary. The treatment options including medical management, injection therapy arthroscopy and arthroplasty were discussed at length. The risks and benefits of total knee arthroplasty were presented and  reviewed. The risks due to aseptic loosening, infection, stiffness, patella tracking problems, thromboembolic complications and other imponderables were discussed. The patient acknowledged the explanation, agreed to proceed with the plan and consent was signed. Patient is being admitted for inpatient treatment for surgery, pain control, PT, OT, prophylactic antibiotics, VTE prophylaxis, progressive ambulation and ADL's and discharge planning. The patient is planning to be discharged home with home health services    [place], [place]

## 2013-12-18 ENCOUNTER — Inpatient Hospital Stay (HOSPITAL_COMMUNITY): Payer: Medicare Other

## 2013-12-18 ENCOUNTER — Inpatient Hospital Stay (HOSPITAL_COMMUNITY)
Admission: RE | Admit: 2013-12-18 | Discharge: 2013-12-21 | DRG: 470 | Disposition: A | Discharge status: Home Health | Payer: Medicare Other | Source: Ambulatory Visit | Attending: Orthopedic Surgery | Admitting: Orthopedic Surgery

## 2013-12-18 ENCOUNTER — Encounter (HOSPITAL_COMMUNITY): Payer: Medicare Other | Admitting: Anesthesiology

## 2013-12-18 ENCOUNTER — Encounter (HOSPITAL_COMMUNITY): Payer: Self-pay

## 2013-12-18 ENCOUNTER — Encounter (HOSPITAL_COMMUNITY)
Admission: RE | Disposition: A | Discharge status: Home Health | Payer: Self-pay | Source: Ambulatory Visit | Attending: Orthopedic Surgery

## 2013-12-18 ENCOUNTER — Inpatient Hospital Stay (HOSPITAL_COMMUNITY): Payer: Medicare Other | Admitting: Anesthesiology

## 2013-12-18 DIAGNOSIS — Z8052 Family history of malignant neoplasm of bladder: Secondary | ICD-10-CM

## 2013-12-18 DIAGNOSIS — M1711 Unilateral primary osteoarthritis, right knee: Secondary | ICD-10-CM | POA: Diagnosis present

## 2013-12-18 DIAGNOSIS — Z6832 Body mass index (BMI) 32.0-32.9, adult: Secondary | ICD-10-CM

## 2013-12-18 DIAGNOSIS — Z8249 Family history of ischemic heart disease and other diseases of the circulatory system: Secondary | ICD-10-CM

## 2013-12-18 DIAGNOSIS — Z96659 Presence of unspecified artificial knee joint: Secondary | ICD-10-CM

## 2013-12-18 DIAGNOSIS — D62 Acute posthemorrhagic anemia: Secondary | ICD-10-CM | POA: Diagnosis not present

## 2013-12-18 DIAGNOSIS — E669 Obesity, unspecified: Secondary | ICD-10-CM | POA: Diagnosis present

## 2013-12-18 DIAGNOSIS — Z820 Family history of epilepsy and other diseases of the nervous system: Secondary | ICD-10-CM

## 2013-12-18 DIAGNOSIS — E039 Hypothyroidism, unspecified: Secondary | ICD-10-CM | POA: Diagnosis present

## 2013-12-18 DIAGNOSIS — M1710 Unilateral primary osteoarthritis, unspecified knee: Principal | ICD-10-CM | POA: Diagnosis present

## 2013-12-18 DIAGNOSIS — I10 Essential (primary) hypertension: Secondary | ICD-10-CM | POA: Diagnosis present

## 2013-12-18 DIAGNOSIS — Z9849 Cataract extraction status, unspecified eye: Secondary | ICD-10-CM

## 2013-12-18 HISTORY — PX: TOTAL KNEE ARTHROPLASTY: SHX125

## 2013-12-18 SURGERY — ARTHROPLASTY, KNEE, TOTAL
Anesthesia: General | Site: Knee | Laterality: Right

## 2013-12-18 MED ORDER — ONDANSETRON HCL 4 MG PO TABS: 4.0000 mg | ORAL_TABLET | Freq: Four times a day (QID) | ORAL | Status: DC | PRN

## 2013-12-18 MED ORDER — RIVAROXABAN 10 MG PO TABS
10.0000 mg | ORAL_TABLET | Freq: Every day | ORAL | Status: DC
  Administered 2013-12-19 – 2013-12-21 (×3): 10 mg via ORAL
  Filled 2013-12-18 (×4): qty 1

## 2013-12-18 MED ORDER — PROMETHAZINE HCL 25 MG/ML IJ SOLN
6.2500 mg | INTRAMUSCULAR | Status: DC | PRN
  Administered 2013-12-18: 6.25 mg via INTRAVENOUS

## 2013-12-18 MED ORDER — HYDROMORPHONE HCL PF 2 MG/ML IJ SOLN
INTRAMUSCULAR | Status: AC
  Filled 2013-12-18: qty 1

## 2013-12-18 MED ORDER — CISATRACURIUM BESYLATE (PF) 10 MG/5ML IV SOLN
INTRAVENOUS | Status: DC | PRN
  Administered 2013-12-18: 6 mg via INTRAVENOUS

## 2013-12-18 MED ORDER — SUCCINYLCHOLINE CHLORIDE 20 MG/ML IJ SOLN
INTRAMUSCULAR | Status: DC | PRN
  Administered 2013-12-18: 100 mg via INTRAVENOUS

## 2013-12-18 MED ORDER — FENTANYL CITRATE 0.05 MG/ML IJ SOLN
INTRAMUSCULAR | Status: AC
  Filled 2013-12-18: qty 5

## 2013-12-18 MED ORDER — DEXAMETHASONE SODIUM PHOSPHATE 10 MG/ML IJ SOLN
INTRAMUSCULAR | Status: DC | PRN
  Administered 2013-12-18: 10 mg via INTRAVENOUS

## 2013-12-18 MED ORDER — ACETAMINOPHEN 10 MG/ML IV SOLN
1000.0000 mg | Freq: Once | INTRAVENOUS | Status: AC
  Administered 2013-12-18: 1000 mg via INTRAVENOUS
  Filled 2013-12-18: qty 100

## 2013-12-18 MED ORDER — CLINDAMYCIN PHOSPHATE 900 MG/50ML IV SOLN
900.0000 mg | INTRAVENOUS | Status: AC
  Administered 2013-12-18: 900 mg via INTRAVENOUS

## 2013-12-18 MED ORDER — POLYETHYLENE GLYCOL 3350 17 G PO PACK: 17.0000 g | PACK | Freq: Every day | ORAL | Status: DC | PRN

## 2013-12-18 MED ORDER — BUPIVACAINE LIPOSOME 1.3 % IJ SUSP
INTRAMUSCULAR | Status: DC | PRN
  Administered 2013-12-18: 20 mL

## 2013-12-18 MED ORDER — ONDANSETRON HCL 4 MG/2ML IJ SOLN
INTRAMUSCULAR | Status: AC
  Filled 2013-12-18: qty 2

## 2013-12-18 MED ORDER — HYDROMORPHONE HCL PF 1 MG/ML IJ SOLN
1.0000 mg | INTRAMUSCULAR | Status: DC | PRN
  Administered 2013-12-18 – 2013-12-19 (×3): 1 mg via INTRAVENOUS
  Filled 2013-12-18 (×3): qty 1

## 2013-12-18 MED ORDER — ALUM & MAG HYDROXIDE-SIMETH 200-200-20 MG/5ML PO SUSP: 30.0000 mL | ORAL | Status: DC | PRN

## 2013-12-18 MED ORDER — METHOCARBAMOL 500 MG PO TABS
500.0000 mg | ORAL_TABLET | Freq: Four times a day (QID) | ORAL | Status: DC | PRN
  Administered 2013-12-19 – 2013-12-20 (×3): 500 mg via ORAL
  Filled 2013-12-18 (×3): qty 1

## 2013-12-18 MED ORDER — CISATRACURIUM BESYLATE (PF) 10 MG/5ML IV SOLN: INTRAVENOUS | Status: DC | PRN

## 2013-12-18 MED ORDER — NEOSTIGMINE METHYLSULFATE 1 MG/ML IJ SOLN
INTRAMUSCULAR | Status: AC
  Filled 2013-12-18: qty 10

## 2013-12-18 MED ORDER — FLEET ENEMA 7-19 GM/118ML RE ENEM: 1.0000 | ENEMA | Freq: Once | RECTAL | Status: AC | PRN

## 2013-12-18 MED ORDER — ESTRADIOL 1 MG PO TABS
1.0000 mg | ORAL_TABLET | Freq: Every day | ORAL | Status: DC
  Administered 2013-12-19 – 2013-12-21 (×3): 1 mg via ORAL
  Filled 2013-12-18 (×4): qty 1

## 2013-12-18 MED ORDER — PROPOFOL 10 MG/ML IV BOLUS
INTRAVENOUS | Status: AC
  Filled 2013-12-18: qty 20

## 2013-12-18 MED ORDER — PROPOFOL 10 MG/ML IV BOLUS
INTRAVENOUS | Status: DC | PRN
  Administered 2013-12-18: 25 mg via INTRAVENOUS
  Administered 2013-12-18: 175 mg via INTRAVENOUS

## 2013-12-18 MED ORDER — SODIUM CHLORIDE 0.9 % IR SOLN
Status: DC | PRN
  Administered 2013-12-18: 16:00:00

## 2013-12-18 MED ORDER — CLINDAMYCIN PHOSPHATE 900 MG/50ML IV SOLN
INTRAVENOUS | Status: AC
  Filled 2013-12-18: qty 50

## 2013-12-18 MED ORDER — MENTHOL 3 MG MT LOZG
1.0000 | LOZENGE | OROMUCOSAL | Status: DC | PRN
  Filled 2013-12-18 (×2): qty 9

## 2013-12-18 MED ORDER — THROMBIN 5000 UNITS EX SOLR
CUTANEOUS | Status: AC
  Filled 2013-12-18: qty 5000

## 2013-12-18 MED ORDER — PHENOL 1.4 % MT LIQD
1.0000 | OROMUCOSAL | Status: DC | PRN
  Filled 2013-12-18: qty 177

## 2013-12-18 MED ORDER — HYDROMORPHONE HCL 2 MG PO TABS
2.0000 mg | ORAL_TABLET | ORAL | Status: DC | PRN
  Administered 2013-12-19 – 2013-12-20 (×4): 2 mg via ORAL
  Filled 2013-12-18 (×4): qty 1

## 2013-12-18 MED ORDER — ACETAMINOPHEN 325 MG PO TABS
650.0000 mg | ORAL_TABLET | Freq: Four times a day (QID) | ORAL | Status: DC | PRN
Start: 2013-12-18 — End: 2013-12-21
  Administered 2013-12-20: 650 mg via ORAL
  Filled 2013-12-18: qty 2

## 2013-12-18 MED ORDER — ONDANSETRON HCL 4 MG/2ML IJ SOLN
4.0000 mg | Freq: Four times a day (QID) | INTRAMUSCULAR | Status: DC | PRN
  Administered 2013-12-19: 4 mg via INTRAVENOUS
  Filled 2013-12-18: qty 2

## 2013-12-18 MED ORDER — LIDOCAINE HCL (CARDIAC) 20 MG/ML IV SOLN
INTRAVENOUS | Status: AC
  Filled 2013-12-18: qty 5

## 2013-12-18 MED ORDER — DEXAMETHASONE SODIUM PHOSPHATE 10 MG/ML IJ SOLN
INTRAMUSCULAR | Status: AC
  Filled 2013-12-18: qty 1

## 2013-12-18 MED ORDER — MIDAZOLAM HCL 5 MG/5ML IJ SOLN
INTRAMUSCULAR | Status: DC | PRN
  Administered 2013-12-18: 1 mg via INTRAVENOUS

## 2013-12-18 MED ORDER — SODIUM CHLORIDE 0.9 % IJ SOLN
INTRAMUSCULAR | Status: AC
  Filled 2013-12-18: qty 50

## 2013-12-18 MED ORDER — LACTATED RINGERS IV SOLN
INTRAVENOUS | Status: DC
  Administered 2013-12-18 – 2013-12-20 (×4): via INTRAVENOUS

## 2013-12-18 MED ORDER — FENTANYL CITRATE 0.05 MG/ML IJ SOLN
INTRAMUSCULAR | Status: DC | PRN
  Administered 2013-12-18: 50 ug via INTRAVENOUS
  Administered 2013-12-18: 150 ug via INTRAVENOUS
  Administered 2013-12-18: 50 ug via INTRAVENOUS

## 2013-12-18 MED ORDER — DEXTROSE 5 % IV SOLN
500.0000 mg | Freq: Four times a day (QID) | INTRAVENOUS | Status: DC | PRN
  Administered 2013-12-18: 500 mg via INTRAVENOUS
  Filled 2013-12-18: qty 5

## 2013-12-18 MED ORDER — HYDROMORPHONE HCL PF 1 MG/ML IJ SOLN
INTRAMUSCULAR | Status: DC | PRN
  Administered 2013-12-18 (×2): 1 mg via INTRAVENOUS

## 2013-12-18 MED ORDER — FERROUS SULFATE 325 (65 FE) MG PO TABS
325.0000 mg | ORAL_TABLET | Freq: Three times a day (TID) | ORAL | Status: DC
  Administered 2013-12-19 – 2013-12-21 (×5): 325 mg via ORAL
  Filled 2013-12-18 (×10): qty 1

## 2013-12-18 MED ORDER — ACETAMINOPHEN 650 MG RE SUPP: 650.0000 mg | Freq: Four times a day (QID) | RECTAL | Status: DC | PRN

## 2013-12-18 MED ORDER — GLYCOPYRROLATE 0.2 MG/ML IJ SOLN
INTRAMUSCULAR | Status: AC
  Filled 2013-12-18: qty 2

## 2013-12-18 MED ORDER — CLINDAMYCIN PHOSPHATE 600 MG/50ML IV SOLN
600.0000 mg | Freq: Four times a day (QID) | INTRAVENOUS | Status: AC
  Administered 2013-12-18 – 2013-12-19 (×2): 600 mg via INTRAVENOUS
  Filled 2013-12-18 (×2): qty 50

## 2013-12-18 MED ORDER — LIDOCAINE HCL (CARDIAC) 20 MG/ML IV SOLN
INTRAVENOUS | Status: DC | PRN
  Administered 2013-12-18: 75 mg via INTRAVENOUS

## 2013-12-18 MED ORDER — BUPIVACAINE LIPOSOME 1.3 % IJ SUSP
20.0000 mL | Freq: Once | INTRAMUSCULAR | Status: DC
  Filled 2013-12-18: qty 20

## 2013-12-18 MED ORDER — HYDROMORPHONE HCL PF 1 MG/ML IJ SOLN: 0.2500 mg | INTRAMUSCULAR | Status: DC | PRN

## 2013-12-18 MED ORDER — PROMETHAZINE HCL 25 MG/ML IJ SOLN
INTRAMUSCULAR | Status: AC
  Filled 2013-12-18: qty 1

## 2013-12-18 MED ORDER — GLYCOPYRROLATE 0.2 MG/ML IJ SOLN
INTRAMUSCULAR | Status: DC | PRN
  Administered 2013-12-18: 0.4 mg via INTRAVENOUS

## 2013-12-18 MED ORDER — NEOSTIGMINE METHYLSULFATE 1 MG/ML IJ SOLN
INTRAMUSCULAR | Status: DC | PRN
  Administered 2013-12-18: 3 mg via INTRAVENOUS

## 2013-12-18 MED ORDER — THROMBIN 5000 UNITS EX SOLR
CUTANEOUS | Status: DC | PRN
  Administered 2013-12-18: 5000 [IU] via TOPICAL

## 2013-12-18 MED ORDER — MIDAZOLAM HCL 2 MG/2ML IJ SOLN
INTRAMUSCULAR | Status: AC
  Filled 2013-12-18: qty 2

## 2013-12-18 MED ORDER — BISACODYL 10 MG RE SUPP: 10.0000 mg | Freq: Every day | RECTAL | Status: DC | PRN

## 2013-12-18 MED ORDER — SODIUM CHLORIDE 0.9 % IJ SOLN
INTRAMUSCULAR | Status: AC
  Filled 2013-12-18: qty 10

## 2013-12-18 MED ORDER — LEVOTHYROXINE SODIUM 75 MCG PO TABS
75.0000 ug | ORAL_TABLET | Freq: Every day | ORAL | Status: DC
  Administered 2013-12-19 – 2013-12-21 (×3): 75 ug via ORAL
  Filled 2013-12-18 (×4): qty 1

## 2013-12-18 MED ORDER — LOSARTAN POTASSIUM 50 MG PO TABS
50.0000 mg | ORAL_TABLET | Freq: Every day | ORAL | Status: DC
  Administered 2013-12-19 – 2013-12-21 (×3): 50 mg via ORAL
  Filled 2013-12-18 (×4): qty 1

## 2013-12-18 MED ORDER — ONDANSETRON HCL 4 MG/2ML IJ SOLN
INTRAMUSCULAR | Status: DC | PRN
  Administered 2013-12-18 (×2): 2 mg via INTRAVENOUS

## 2013-12-18 MED ORDER — LACTATED RINGERS IV SOLN: INTRAVENOUS | Status: DC

## 2013-12-18 MED ORDER — LACTATED RINGERS IV SOLN
INTRAVENOUS | Status: DC
  Administered 2013-12-18: 15:00:00 via INTRAVENOUS
  Administered 2013-12-18: 1000 mL via INTRAVENOUS

## 2013-12-18 SURGICAL SUPPLY — 62 items
BAG ZIPLOCK 12X15 (MISCELLANEOUS) IMPLANT
BANDAGE ELASTIC 4 VELCRO ST LF (GAUZE/BANDAGES/DRESSINGS) ×3 IMPLANT
BANDAGE ELASTIC 6 VELCRO ST LF (GAUZE/BANDAGES/DRESSINGS) ×3 IMPLANT
BANDAGE ESMARK 6X9 LF (GAUZE/BANDAGES/DRESSINGS) ×1 IMPLANT
BLADE SAG 18X100X1.27 (BLADE) ×3 IMPLANT
BLADE SAW SGTL 11.0X1.19X90.0M (BLADE) ×3 IMPLANT
BNDG ESMARK 6X9 LF (GAUZE/BANDAGES/DRESSINGS) ×3
BONE CEMENT GENTAMICIN (Cement) ×6 IMPLANT
CAPT RP KNEE ×3 IMPLANT
CEMENT BONE GENTAMICIN 40 (Cement) ×2 IMPLANT
CUFF TOURN SGL QUICK 34 (TOURNIQUET CUFF) ×2
CUFF TRNQT CYL 34X4X40X1 (TOURNIQUET CUFF) ×1 IMPLANT
DERMABOND ADVANCED (GAUZE/BANDAGES/DRESSINGS) ×2
DERMABOND ADVANCED .7 DNX12 (GAUZE/BANDAGES/DRESSINGS) ×1 IMPLANT
DRAPE EXTREMITY T 121X128X90 (DRAPE) ×3 IMPLANT
DRAPE INCISE IOBAN 66X45 STRL (DRAPES) ×3 IMPLANT
DRAPE LG THREE QUARTER DISP (DRAPES) ×3 IMPLANT
DRAPE POUCH INSTRU U-SHP 10X18 (DRAPES) ×3 IMPLANT
DRAPE U-SHAPE 47X51 STRL (DRAPES) ×3 IMPLANT
DRSG AQUACEL AG ADV 3.5X10 (GAUZE/BANDAGES/DRESSINGS) ×3 IMPLANT
DRSG TEGADERM 4X4.75 (GAUZE/BANDAGES/DRESSINGS) ×3 IMPLANT
DURAPREP 26ML APPLICATOR (WOUND CARE) ×3 IMPLANT
ELECT REM PT RETURN 9FT ADLT (ELECTROSURGICAL) ×3
ELECTRODE REM PT RTRN 9FT ADLT (ELECTROSURGICAL) ×1 IMPLANT
EVACUATOR 1/8 PVC DRAIN (DRAIN) ×3 IMPLANT
FACESHIELD LNG OPTICON STERILE (SAFETY) ×18 IMPLANT
GAUZE SPONGE 2X2 8PLY STRL LF (GAUZE/BANDAGES/DRESSINGS) ×1 IMPLANT
GLOVE BIOGEL PI IND STRL 8 (GLOVE) ×1 IMPLANT
GLOVE BIOGEL PI INDICATOR 8 (GLOVE) ×2
GLOVE ECLIPSE 8.0 STRL XLNG CF (GLOVE) ×27 IMPLANT
GLOVE SURG SS PI 6.5 STRL IVOR (GLOVE) ×6 IMPLANT
GOWN STRL REUS W/TWL LRG LVL3 (GOWN DISPOSABLE) ×6 IMPLANT
GOWN STRL REUS W/TWL XL LVL3 (GOWN DISPOSABLE) ×9 IMPLANT
HANDPIECE INTERPULSE COAX TIP (DISPOSABLE) ×2
IMMOBILIZER KNEE 20 (SOFTGOODS) ×3 IMPLANT
KIT BASIN OR (CUSTOM PROCEDURE TRAY) ×3 IMPLANT
MANIFOLD NEPTUNE II (INSTRUMENTS) ×3 IMPLANT
NEEDLE HYPO 22GX1.5 SAFETY (NEEDLE) ×3 IMPLANT
NS IRRIG 1000ML POUR BTL (IV SOLUTION) IMPLANT
PACK TOTAL JOINT (CUSTOM PROCEDURE TRAY) ×3 IMPLANT
PAD ABD 8X10 STRL (GAUZE/BANDAGES/DRESSINGS) IMPLANT
PADDING CAST COTTON 6X4 STRL (CAST SUPPLIES) IMPLANT
POSITIONER SURGICAL ARM (MISCELLANEOUS) ×3 IMPLANT
SET HNDPC FAN SPRY TIP SCT (DISPOSABLE) ×1 IMPLANT
SPONGE GAUZE 2X2 STER 10/PKG (GAUZE/BANDAGES/DRESSINGS) ×2
SPONGE LAP 18X18 X RAY DECT (DISPOSABLE) IMPLANT
SPONGE SURGIFOAM ABS GEL 100 (HEMOSTASIS) ×3 IMPLANT
STAPLER VISISTAT 35W (STAPLE) IMPLANT
SUCTION FRAZIER 12FR DISP (SUCTIONS) ×3 IMPLANT
SUT BONE WAX W31G (SUTURE) IMPLANT
SUT MNCRL AB 4-0 PS2 18 (SUTURE) ×3 IMPLANT
SUT VIC AB 1 CT1 27 (SUTURE) ×4
SUT VIC AB 1 CT1 27XBRD ANTBC (SUTURE) ×2 IMPLANT
SUT VIC AB 2-0 CT1 27 (SUTURE) ×4
SUT VIC AB 2-0 CT1 TAPERPNT 27 (SUTURE) ×2 IMPLANT
SUT VLOC 180 0 24IN GS25 (SUTURE) ×3 IMPLANT
SYR 20CC LL (SYRINGE) ×6 IMPLANT
TOWEL OR 17X26 10 PK STRL BLUE (TOWEL DISPOSABLE) ×3 IMPLANT
TOWER CARTRIDGE SMART MIX (DISPOSABLE) ×3 IMPLANT
TRAY FOLEY CATH 14FRSI W/METER (CATHETERS) ×3 IMPLANT
WATER STERILE IRR 1500ML POUR (IV SOLUTION) ×3 IMPLANT
WRAP KNEE MAXI GEL POST OP (GAUZE/BANDAGES/DRESSINGS) ×3 IMPLANT

## 2013-12-18 NOTE — Brief Op Note (Signed)
12/18/2013  3:36 PM  PATIENT:  Sarah [name]  73 y.o. female  PRE-OPERATIVE DIAGNOSIS:  OA RIGH KNEE  And Obesity  POST-OPERATIVE DIAGNOSIS:  osteoarthritis right knee and Obesity  PROCEDURE:  Procedure(s): RIGHT TOTAL KNEE ARTHROPLASTY (Right)  SURGEON:  Surgeon(s) and Role:    * Tobi Bastos, MD - Primary  PHYSICIAN ASSISTANT: Ardeen Jourdain PA   ASSISTANTS:Amber [place] PA  ANESTHESIA:   general  EBL:  Total I/O In: 1000 [I.V.:1000] Out: 75 [Urine:75]  BLOOD ADMINISTERED:none  DRAINS: (One) Hemovact drain(s) in the Right Knee with  Suction Open   LOCAL MEDICATIONS USED:  BUPIVICAINE 20cc mixed with 20cc of Normal Saline  SPECIMEN:  No Specimen  DISPOSITION OF SPECIMEN:  N/A  COUNTS:  YES  TOURNIQUET:  * Missing tourniquet times found for documented tourniquets in log:  [id number] *  DICTATION: .Other Dictation: Dictation Number [id number]  PLAN OF CARE: Admit to inpatient   PATIENT DISPOSITION:  Stable in OR   Delay start of Pharmacological VTE agent (>24hrs) due to surgical blood loss or risk of bleeding: yes

## 2013-12-18 NOTE — Progress Notes (Signed)
X-ray results noted 

## 2013-12-18 NOTE — Progress Notes (Signed)
Portable AP and Lateral Right Knee X-rays done. 

## 2013-12-18 NOTE — Anesthesia Preprocedure Evaluation (Addendum)
Anesthesia Evaluation  Patient identified by MRN, date of birth, ID band Patient awake    Reviewed: Allergy & Precautions, H&P , NPO status , Patient's Chart, lab work & pertinent test results  Airway Mallampati: II TM Distance: >3 FB Neck ROM: full    Dental  (+) Chipped and Dental Advisory Given Chip right upper front:   Pulmonary neg pulmonary ROS,  breath sounds clear to auscultation  Pulmonary exam normal       Cardiovascular Exercise Tolerance: Good hypertension, Pt. on medications Rhythm:regular Rate:Normal  syncope   Neuro/Psych Back surgery negative neurological ROS  negative psych ROS   GI/Hepatic negative GI ROS, Neg liver ROS,   Endo/Other  negative endocrine ROSHypothyroidism   Renal/GU negative Renal ROS  negative genitourinary   Musculoskeletal   Abdominal   Peds  Hematology negative hematology ROS (+) anemia ,   Anesthesia Other Findings   Reproductive/Obstetrics negative OB ROS                          Anesthesia Physical Anesthesia Plan  ASA: II  Anesthesia Plan: General   Post-op Pain Management:    Induction: Intravenous  Airway Management Planned: Oral ETT  Additional Equipment:   Intra-op Plan:   Post-operative Plan: Extubation in OR  Informed Consent: I have reviewed the patients History and Physical, chart, labs and discussed the procedure including the risks, benefits and alternatives for the proposed anesthesia with the patient or authorized representative who has indicated his/her understanding and acceptance.   Dental Advisory Given  Plan Discussed with: CRNA and Surgeon  Anesthesia Plan Comments:         Anesthesia Quick Evaluation

## 2013-12-18 NOTE — Anesthesia Postprocedure Evaluation (Signed)
  Anesthesia Post-op Note  Patient: Sarah [name]  Procedure(s) Performed: Procedure(s) (LRB): RIGHT TOTAL KNEE ARTHROPLASTY (Right)  Patient Location: PACU  Anesthesia Type: General  Level of Consciousness: awake and alert   Airway and Oxygen Therapy: Patient Spontanous Breathing  Post-op Pain: mild  Post-op Assessment: Post-op Vital signs reviewed, Patient's Cardiovascular Status Stable, Respiratory Function Stable, Patent Airway and No signs of Nausea or vomiting  Last Vitals:  Filed Vitals:   12/18/13 1630  BP: 151/63  Pulse: 65  Temp:   Resp: 11    Post-op Vital Signs: stable   Complications: No apparent anesthesia complications

## 2013-12-18 NOTE — Transfer of Care (Signed)
Immediate Anesthesia Transfer of Care Note  Patient: Sarah [name]  Procedure(s) Performed: Procedure(s): RIGHT TOTAL KNEE ARTHROPLASTY (Right)  Patient Location: PACU  Anesthesia Type:General  Level of Consciousness: awake, alert  and oriented  Airway & Oxygen Therapy: Patient Spontanous Breathing and Patient connected to face mask oxygen  Post-op Assessment: Report given to PACU RN and Post -op Vital signs reviewed and stable  Post vital signs: Reviewed and stable  Complications: No apparent anesthesia complications

## 2013-12-18 NOTE — Anesthesia Procedure Notes (Signed)
Procedure Name: Intubation Date/Time: 12/18/2013 1:46 PM Performed by: Ofilia Neas Pre-anesthesia Checklist: Patient identified, Timeout performed, Emergency Drugs available, Suction available and Patient being monitored Patient Re-evaluated:Patient Re-evaluated prior to inductionOxygen Delivery Method: Circle system utilized Preoxygenation: Pre-oxygenation with 100% oxygen Intubation Type: IV induction and Cricoid Pressure applied Ventilation: Mask ventilation without difficulty Laryngoscope Size: Mac and 4 Grade View: Grade II Tube type: Oral Tube size: 7.5 mm Number of attempts: 2 (first esophageal removed, new ett intubated on second attempt) Airway Equipment and Method: Stylet Placement Confirmation: ETT inserted through vocal cords under direct vision,  positive ETCO2 and breath sounds checked- equal and bilateral Secured at: 21 cm Tube secured with: Tape (paper tape) Dental Injury: Teeth and Oropharynx as per pre-operative assessment  Difficulty Due To: Difficult Airway- due to reduced neck mobility, Difficult Airway- due to large tongue, Difficulty was anticipated, Difficult Airway- due to anterior larynx and Difficult Airway- due to limited oral opening Future Recommendations: Recommend- induction with short-acting agent, and alternative techniques readily available

## 2013-12-18 NOTE — Interval H&P Note (Signed)
History and Physical Interval Note:  12/18/2013 1:09 PM  Sarah [name]  has presented today for surgery, with the diagnosis of OA RIGH KNEE   The various methods of treatment have been discussed with the patient and family. After consideration of risks, benefits and other options for treatment, the patient has consented to  Procedure(s): RIGHT TOTAL KNEE ARTHROPLASTY (Right) as a surgical intervention .  The patient's history has been reviewed, patient examined, no change in status, stable for surgery.  I have reviewed the patient's chart and labs.  Questions were answered to the patient's satisfaction.     Clell Trahan A

## 2013-12-19 ENCOUNTER — Encounter (HOSPITAL_COMMUNITY): Payer: Self-pay | Admitting: Orthopedic Surgery

## 2013-12-19 LAB — CBC
HEMATOCRIT: 30.7 % — AB (ref 36.0–46.0)
Hemoglobin: 10.3 g/dL — ABNORMAL LOW (ref 12.0–15.0)
MCH: 29.4 pg (ref 26.0–34.0)
MCHC: 33.6 g/dL (ref 30.0–36.0)
MCV: 87.7 fL (ref 78.0–100.0)
PLATELETS: 144 10*3/uL — AB (ref 150–400)
RBC: 3.5 MIL/uL — ABNORMAL LOW (ref 3.87–5.11)
RDW: 12.4 % (ref 11.5–15.5)
WBC: 12.9 10*3/uL — AB (ref 4.0–10.5)

## 2013-12-19 LAB — BASIC METABOLIC PANEL
BUN: 10 mg/dL (ref 6–23)
CALCIUM: 8.3 mg/dL — AB (ref 8.4–10.5)
CO2: 24 mEq/L (ref 19–32)
Chloride: 100 mEq/L (ref 96–112)
Creatinine, Ser: 0.6 mg/dL (ref 0.50–1.10)
GFR, EST NON AFRICAN AMERICAN: 90 mL/min — AB (ref >90)
Glucose, Bld: 163 mg/dL — ABNORMAL HIGH (ref 70–99)
Potassium: 4.4 mEq/L (ref 3.7–5.3)
Sodium: 137 mEq/L (ref 137–147)

## 2013-12-19 MED ORDER — METHOCARBAMOL 500 MG PO TABS: 500.0000 mg | ORAL_TABLET | Freq: Four times a day (QID) | ORAL | Status: DC | PRN

## 2013-12-19 MED ORDER — RIVAROXABAN 10 MG PO TABS: 10.0000 mg | ORAL_TABLET | Freq: Every day | ORAL | Status: DC

## 2013-12-19 MED ORDER — HYDROMORPHONE HCL 2 MG PO TABS: 2.0000 mg | ORAL_TABLET | ORAL | Status: DC | PRN

## 2013-12-19 NOTE — Op Note (Signed)
Sarah [name], Sarah [name]             ACCOUNT NO.:  [id number]  MEDICAL RECORD NO.:  [id number]  LOCATION:  6962                         FACILITY:  [facility]  PHYSICIAN:  Kipp Brood. Ekta Dancer, M.D.DATE OF BIRTH:  06/03/1941  DATE OF PROCEDURE:  12/18/2013 DATE OF DISCHARGE:                              OPERATIVE REPORT   SURGEON:  Kipp Brood. Gladstone Lighter, M.D.  ASSISTANT:  Ardeen Jourdain, [place]  PREOPERATIVE DIAGNOSES: 1. Obesity. 2. Severe degenerative arthritis with bone-on-bone, right knee with a     slight contracture.  POSTOPERATIVE DIAGNOSES: 1. Obesity. 2. Severe degenerative arthritis with bone-on-bone, right knee with a     slight contracture.  OPERATIONS: 1. Release of flexion contractures, right knee. 2. Right total knee arthroplasty utilizing the DePuy system.  All     three components were cemented and gentamicin was used in the     cement.  The size used was a size 4 right femoral component,     posterior cruciate sacrificing type.  Tibial tray was a size 3.     The insert was a size 4 10 mm thickness.  The patella was a size 38     patella with 3 pegs.  DESCRIPTION OF PROCEDURE:  Under general anesthesia, routine orthopedic prepping and draping of the right lower extremity was carried out.  The appropriate time-out was first carried out.  At this time, also I marked the appropriate right leg in the holding area.  The leg was exsanguinated with an Esmarch.  Tourniquet was elevated at 325 mmHg. The knee was flexed.  Anterior approach of the knee was carried out. Two flaps were created.  I then carried out the medial parapatellar incision in usual fashion.  Following this, I did medial and lateral meniscectomies.  I excised the anterior and posterior cruciate ligaments.  Next, initial drill hole was made in the intercondylar notch and I then inserted the guide rod.  I then thoroughly irrigated out the femoral canal. Then, I removed 12 mm thickness off of the distal femur. At  that particular time, we then measured the femur to be a size 4 right, inserted my next jig and we did anterior, posterior and chamfering cuts for a size 4 right femoral component.  Next, attention was directed to the tibia.  We shaved the tibial spines off first with an oscillating saw.  Then, we measured the tibial plateau to be a size 3.  We then made our initial drill hole in the tibial plateau, inserted our guide rod down the canal, and at this particular time, we irrigated out the tibial canal as well.  I then inserted my next jig and removed 8- mm thickness off the affected medial side.  Note, the knee was extremely tight.  We then inserted our lamina spreaders, removed the posterior spurs and continued capsular release.  Following that, we then inserted our spacer blocks, had excellent fit with spacer blocks, good stability. We then continued on and made our keel cut out of the tibial plateau in usual fashion.  Following that, we did our notch cut out of the distal femur in the usual fashion.  All trial components were inserted.  We inserted 10-mm  thickness insert, reduced the knee and did a patellar resurfacing procedure for a size 38 patella.  Three drill holes were made in the articular surface of the patella.  All trial components were removed.  We thoroughly water picked out the knee, cemented all three components in simultaneously.  Gentamicin was used in the cement.  Once the cement was hardened, we removed all loose pieces of cement.  We water picked out the knee, and we felt at this time after testing once again, a 10-mm thickness insert was the best.  We then inserted some thrombin-soaked Gelfoam.  I then inserted our permanent rotating platform size 4 10-mm thickness.  Reduced the knee and had excellent function.  We had good flexion, good extension and good stability.  We then reduced the patella and inserted a Hemovac drain, and closed the wound layers in usual  fashion.  2 g of IV Ancef was given preop.          ______________________________ Kipp Brood. Gladstone Lighter, M.D.     RAG/MEDQ  D:  12/18/2013  T:  12/19/2013  Job:  [id number]

## 2013-12-19 NOTE — Progress Notes (Signed)
Physical Therapy Treatment Patient Details Name: Sarah [name] MRN: [id number] DOB: [birth year] Today's Date: 12/19/2013 Time: 6256-3893 PT Time Calculation (min): 18 min  PT Assessment / Plan / Recommendation  History of Present Illness     PT Comments   Pt very motivated but limited and frustrated by nausea with attempts at activity  Follow Up Recommendations  Home health PT     Does the patient have the potential to tolerate intense rehabilitation     Barriers to Discharge        Equipment Recommendations  None recommended by PT    Recommendations for Other Services OT consult  Frequency 7X/week   Progress towards PT Goals Progress towards PT goals: Not progressing toward goals - comment (nausea)  Plan Current plan remains appropriate    Precautions / Restrictions Precautions Precautions: Fall Required Braces or Orthoses: Knee Immobilizer - Right Knee Immobilizer - Right: Discontinue once straight leg raise with < 10 degree lag Restrictions Weight Bearing Restrictions: No Other Position/Activity Restrictions: WBAT   Pertinent Vitals/Pain 7/10; RN aware    Mobility  Bed Mobility Overal bed mobility: Needs Assistance Bed Mobility: Sit to Supine Sit to supine: Min assist;Mod assist General bed mobility comments: cues for sequence and use of L LE to self assist Transfers Overall transfer level: Needs assistance Equipment used: Rolling walker (2 wheeled) Transfers: Sit to/from Stand Sit to Stand: Min assist General transfer comment: cues for LE management and use of UEs to self assist Ambulation/Gait Ambulation/Gait assistance: Min assist;Mod assist Ambulation Distance (Feet): 5 Feet Assistive device: Rolling walker (2 wheeled) Gait Pattern/deviations: Step-to pattern;Decreased step length - right;Decreased step length - left;Shuffle;Trunk flexed    Exercises     PT Diagnosis:    PT Problem List:   PT Treatment Interventions:     PT Goals (current goals  can now be found in the care plan section) Acute Rehab PT Goals Patient Stated Goal: Resume previous lifestyle with decreased pain PT Goal Formulation: With patient Time For Goal Achievement: 12/26/13 Potential to Achieve Goals: Good  Visit Information  Last PT Received On: 12/19/13 Assistance Needed: +1    Subjective Data  Subjective: Pt motivated but ltd by onset of nausea with movement Patient Stated Goal: Resume previous lifestyle with decreased pain   Cognition  Cognition Arousal/Alertness: Awake/alert Behavior During Therapy: WFL for tasks assessed/performed Overall Cognitive Status: Within Functional Limits for tasks assessed    Balance     End of Session PT - End of Session Equipment Utilized During Treatment: Gait belt;Right knee immobilizer Activity Tolerance: Other (comment) (nausea) Patient left: in bed;with call bell/phone within reach Nurse Communication: Mobility status;Other (comment) (nausea)   GP     Cheyann Blecha 12/19/2013, 4:27 PM

## 2013-12-19 NOTE — Care Management Note (Signed)
  Page 1 of 1   12/19/2013     4:31:28 PM   CARE MANAGEMENT NOTE 12/19/2013  Patient:  SHAN, PADGETT   Account Number:  [id number]  Date Initiated:  12/19/2013  Documentation initiated by:  Sherrin Daisy  Subjective/Objective Assessment:   DX RT KNEE REPLACEMNT     Action/Plan:   CM  spoke with patient. Plans are for patient to return to her home in [facility] where spouse will be caregiver. States her son and daughter-in-law will also assist with her care.   Anticipated DC Date:  12/21/2013   Anticipated DC Plan:  [place]  CM consult      [facility] Choice  HOME HEALTH   Choice offered to / List presented to:  C-1 Patient        [place] arranged  HH-2 PT      [place]   Status of service:  In process, will continue to follow Medicare Important Message given?   (If response is "NO", the following Medicare IM given date fields will be blank) Date Medicare IM given:   Date Additional Medicare IM given:    Discharge Disposition:    Per UR Regulation:    If discussed at Long Length of Stay Meetings, dates discussed:    Comments:  01/'15/2015 Sherrin Daisy BSN RN CCM [phone] Pt states she already has RW & commode. Plans to use [facility] for [facility] services upon discharge to home. CM will follow.

## 2013-12-19 NOTE — Evaluation (Signed)
Physical Therapy Evaluation Patient Details Name: Sarah [name] MRN: [id number] DOB: [birth year] Today's Date: 12/19/2013 Time: 4970-2637 PT Time Calculation (min): 37 min  PT Assessment / Plan / Recommendation History of Present Illness     Clinical Impression  Pt s/p R TKR presents with decreased R LE strength/ROM and post op pain and nausea limiting functional mobility.  Pt should progress well to d/c home with family assist and HHPT follow up.    PT Assessment  Patient needs continued PT services    Follow Up Recommendations  Home health PT    Does the patient have the potential to tolerate intense rehabilitation      Barriers to Discharge        Equipment Recommendations  None recommended by PT    Recommendations for Other Services OT consult   Frequency 7X/week    Precautions / Restrictions Precautions Precautions: Fall Required Braces or Orthoses: Knee Immobilizer - Right Knee Immobilizer - Right: Discontinue once straight leg raise with < 10 degree lag Restrictions Weight Bearing Restrictions: No Other Position/Activity Restrictions: WBAT   Pertinent Vitals/Pain 5/10; premed, ice packs provided      Mobility  Bed Mobility Overal bed mobility: Needs Assistance Bed Mobility: Supine to Sit Supine to sit: Mod assist General bed mobility comments: cues for sequence and use of L LE to self assist Transfers Overall transfer level: Needs assistance Equipment used: Rolling walker (2 wheeled) Transfers: Sit to/from Stand Sit to Stand: Mod assist General transfer comment: cues for LE management and use of UEs to self assist Ambulation/Gait Ambulation/Gait assistance: Mod assist Ambulation Distance (Feet): 20 Feet Assistive device: Rolling walker (2 wheeled) Gait Pattern/deviations: Step-to pattern;Decreased step length - right;Decreased step length - left;Shuffle;Trunk flexed;Antalgic General Gait Details: Cues for sequence, stride length, posture , and  position from RW    Exercises Total Joint Exercises Ankle Circles/Pumps: AROM;15 reps;Supine;Both Quad Sets: AROM;Both;10 reps;Supine Heel Slides: AAROM;10 reps;Supine;Right Straight Leg Raises: AAROM;Right;10 reps;Supine   PT Diagnosis: Difficulty walking  PT Problem List: Decreased strength;Decreased range of motion;Decreased activity tolerance;Decreased mobility;Decreased knowledge of use of DME;Pain PT Treatment Interventions: DME instruction;Gait training;Stair training;Functional mobility training;Therapeutic activities;Therapeutic exercise;Patient/family education     PT Goals(Current goals can be found in the care plan section) Acute Rehab PT Goals Patient Stated Goal: Resume previous lifestyle with decreased pain PT Goal Formulation: With patient Time For Goal Achievement: 12/26/13 Potential to Achieve Goals: Good  Visit Information  Last PT Received On: 12/19/13 Assistance Needed: +1       Prior [place] expects to be discharged to:: Private residence Living Arrangements: Spouse/significant other Available Help at Discharge: Family Type of Home: House Home Access: Stairs to enter Technical brewer of Steps: 2+1 Entrance Stairs-Rails: Right Home Layout: One level Home Equipment: Environmental consultant - 2 wheels Prior Function Level of Independence: Independent Communication Communication: No difficulties    Cognition  Cognition Arousal/Alertness: Awake/alert Behavior During Therapy: WFL for tasks assessed/performed Overall Cognitive Status: Within Functional Limits for tasks assessed    Extremity/Trunk Assessment Upper Extremity Assessment Upper Extremity Assessment: Overall WFL for tasks assessed Lower Extremity Assessment Lower Extremity Assessment: RLE deficits/detail RLE Deficits / Details: 2/5 quads with AAROM at knee -10 - 40   Balance    End of Session PT - End of Session Equipment Utilized During Treatment: Gait belt;Right  knee immobilizer Activity Tolerance: Patient tolerated treatment well;Other (comment) (ltd by nausea) Patient left: in chair;with call bell/phone within reach;with family/visitor present Nurse Communication: Mobility status  GP     Sarah [name] 12/19/2013, 12:46 PM

## 2013-12-19 NOTE — Progress Notes (Signed)
Subjective: 1 Day Post-Op Procedure(s) (LRB): RIGHT TOTAL KNEE ARTHROPLASTY (Right) Patient reports pain as 6 on 0-10 scale Doing well. Will DC Drain. .    Objective: Vital signs in last 24 hours: Temp:  [97.5 F (36.4 C)-98.8 F (37.1 C)] 98.4 F (36.9 C) (01/15 0507) Pulse Rate:  [64-98] 79 (01/15 0507) Resp:  [10-18] 16 (01/15 0507) BP: (97-164)/(63-95) 130/78 mmHg (01/15 0507) SpO2:  [96 %-100 %] 98 % (01/15 0507)  Intake/Output from previous day: 01/14 0701 - 01/15 0700 In: 2993.3 [P.O.:240; I.V.:2598.3; IV Piggyback:155] Out: 3267 [Urine:1450; Drains:200; Blood:25] Intake/Output this shift: Total I/O In: 1338.3 [P.O.:240; I.V.:998.3; IV Piggyback:100] Out: 1350 [Urine:1200; Drains:150]   Recent Labs  12/19/13 0550  HGB 10.3*    Recent Labs  12/19/13 0550  WBC 12.9*  RBC 3.50*  HCT 30.7*  PLT 144*   No results found for this basename: NA, K, CL, CO2, BUN, CREATININE, GLUCOSE, CALCIUM,  in the last 72 hours No results found for this basename: LABPT, INR,  in the last 72 hours  Dorsiflexion/Plantar flexion intact  Assessment/Plan: 1 Day Post-Op Procedure(s) (LRB): RIGHT TOTAL KNEE ARTHROPLASTY (Right) Up with therapy. Plan on DC Saturday  Khayman Kirsch A 12/19/2013, 6:46 AM

## 2013-12-19 NOTE — Discharge Instructions (Addendum)
Walk with your walker. Partial weightbearing for one week. Then transition to weightbearing as tolerated [place] will follow you at home for your therapy  Do not change your dressing unless there is excess drainage Shower only, no tub bath. Call if any temperatures greater than 101 or any wound complications: [phone] during the day and ask for Dr. Charlestine Night nurse, Brunilda Payor.   Information on my medicine - XARELTO (Rivaroxaban)  This medication education was reviewed with me or my healthcare representative as part of my discharge preparation.  The pharmacist that spoke with me during my hospital stay was:  Absher, Julieta Bellini, RPH  Why was Xarelto prescribed for you? Xarelto was prescribed for you to reduce the risk of a blood clots forming after orthopedic surgery OR to reduce the risk of forming blood clots that cause a stroke if you have a medical condition called atrial fibrillation (a type of irregular heartbeat).  What do you need to know about xarelto ? Take your Xarelto ONCE DAILY at the same time every day. If you have difficulty swallowing the tablet whole, you may crush it and mix in applesauce just prior to taking your dose.  Take Xarelto exactly as prescribed by your doctor and DO NOT stop taking Xarelto without talking to the doctor who prescribed the medication.  Stopping without other stroke or VTE prevention medication to take the place of Xarelto may increase your risk of developing a new clot.  After discharge, you should have regular check-up appointments with your healthcare provider that is prescribing your Xarelto.  In the future your dose may need to be changed if your kidney function or weight changes by a significant amount.  What do you do if you miss a dose? If you are taking Xarelto ONCE DAILY and you miss a dose, take it as soon as you remember on the same day then continue your regularly scheduled once daily regimen the next day. Do not  take two doses of Xarelto at the same time.   Important Safety Information A possible side effect of Xarelto is bleeding. You should call your healthcare provider right away if you experience any of the following:   Bleeding from an injury or your nose that does not stop.   Unusual colored urine (red or dark brown) or unusual colored stools (red or black).   Unusual bruising for unknown reasons.   A serious fall or if you hit your head (even if there is no bleeding).  Some medicines may interact with Xarelto and might increase your risk of bleeding while on Xarelto. To help avoid this, consult your healthcare provider or pharmacist prior to using any new prescription or non-prescription medications, including herbals, vitamins, non-steroidal anti-inflammatory drugs (NSAIDs) and supplements.  This website has more information on Xarelto: [email or web address]-benson.[email or web address]

## 2013-12-19 NOTE — Progress Notes (Signed)
OT Cancellation Note  Patient Details Name: Sarah [name] MRN: [id number] DOB: [birth year]   Cancelled Treatment:    Reason Eval/Treat Not Completed: Other (comment) Per PT, pt nauseous getting up this am. Will try back later time.  Jules Schick [phone] 12/19/2013, 11:29 AM

## 2013-12-19 NOTE — Progress Notes (Signed)
Utilization review completed.  

## 2013-12-20 ENCOUNTER — Encounter (HOSPITAL_COMMUNITY): Payer: Self-pay | Admitting: Orthopedic Surgery

## 2013-12-20 DIAGNOSIS — D62 Acute posthemorrhagic anemia: Secondary | ICD-10-CM | POA: Diagnosis not present

## 2013-12-20 LAB — BASIC METABOLIC PANEL
BUN: 10 mg/dL (ref 6–23)
CALCIUM: 8.2 mg/dL — AB (ref 8.4–10.5)
CO2: 27 mEq/L (ref 19–32)
Chloride: 103 mEq/L (ref 96–112)
Creatinine, Ser: 0.66 mg/dL (ref 0.50–1.10)
GFR calc Af Amer: >90 mL/min (ref >90)
GFR calc non Af Amer: 87 mL/min — ABNORMAL LOW (ref >90)
GLUCOSE: 143 mg/dL — AB (ref 70–99)
Potassium: 4.3 mEq/L (ref 3.7–5.3)
SODIUM: 138 meq/L (ref 137–147)

## 2013-12-20 LAB — CBC
HCT: 23.1 % — ABNORMAL LOW (ref 36.0–46.0)
HEMOGLOBIN: 8.1 g/dL — AB (ref 12.0–15.0)
MCH: 30.6 pg (ref 26.0–34.0)
MCHC: 35.1 g/dL (ref 30.0–36.0)
MCV: 87.2 fL (ref 78.0–100.0)
Platelets: 114 10*3/uL — ABNORMAL LOW (ref 150–400)
RBC: 2.65 MIL/uL — ABNORMAL LOW (ref 3.87–5.11)
RDW: 12.7 % (ref 11.5–15.5)
WBC: 10.6 10*3/uL — ABNORMAL HIGH (ref 4.0–10.5)

## 2013-12-20 LAB — PREPARE RBC (CROSSMATCH)

## 2013-12-20 LAB — HEMOGLOBIN AND HEMATOCRIT, BLOOD
HCT: 22.8 % — ABNORMAL LOW (ref 36.0–46.0)
Hemoglobin: 7.8 g/dL — ABNORMAL LOW (ref 12.0–15.0)

## 2013-12-20 MED ORDER — ONDANSETRON HCL 4 MG PO TABS: 4.0000 mg | ORAL_TABLET | Freq: Four times a day (QID) | ORAL | Status: DC | PRN

## 2013-12-20 NOTE — Addendum Note (Signed)
Addendum created 12/20/13 1341 by Peyton Najjar, MD   Modules edited: Anesthesia Events, Anesthesia Responsible Staff

## 2013-12-20 NOTE — Progress Notes (Signed)
Subjective: 2 Days Post-Op Procedure(s) (LRB): RIGHT TOTAL KNEE ARTHROPLASTY (Right) Patient reports pain as 2 on 0-10 scale.Doing Well today. Hbg 8.1. Afebrile.    Objective: Vital signs in last 24 hours: Temp:  [98.3 F (36.8 C)-99.5 F (37.5 C)] 99.1 F (37.3 C) (01/16 0515) Pulse Rate:  [77-96] 96 (01/16 0515) Resp:  [16-18] 18 (01/16 0734) BP: (110-128)/(66-74) 117/74 mmHg (01/16 0515) SpO2:  [92 %-100 %] 92 % (01/16 0734) Weight:  [92.987 kg (205 lb)] 92.987 kg (205 lb) (01/15 2123)  Intake/Output from previous day: 01/15 0701 - 01/16 0700 In: 1365 [P.O.:240; I.V.:1125] Out: 175 [Urine:175] Intake/Output this shift:     Recent Labs  12/19/13 0550 12/20/13 0514  HGB 10.3* 8.1*    Recent Labs  12/19/13 0550 12/20/13 0514  WBC 12.9* 10.6*  RBC 3.50* 2.65*  HCT 30.7* 23.1*  PLT 144* 114*    Recent Labs  12/19/13 0550 12/20/13 0514  NA 137 138  K 4.4 4.3  CL 100 103  CO2 24 27  BUN 10 10  CREATININE 0.60 0.66  GLUCOSE 163* 143*  CALCIUM 8.3* 8.2*   No results found for this basename: LABPT, INR,  in the last 72 hours  Dorsiflexion/Plantar flexion intact  Assessment/Plan: 2 Days Post-Op Procedure(s) (LRB): RIGHT TOTAL KNEE ARTHROPLASTY (Right) Up with therapy. DC Saturday.  Sarah [name] 12/20/2013, 7:35 AM

## 2013-12-20 NOTE — Progress Notes (Signed)
Physical Therapy Treatment Patient Details Name: Sarah [name] MRN: [id number] DOB: 1941-01-06 Today's Date: 12/20/2013 Time: 7425-9563 PT Time Calculation (min): 25 min  PT Assessment / Plan / Recommendation  History of Present Illness pt admitted for R TKA   PT Comments   POD # 2 am session.  Instructed on KI use than assisted OOB to amb in hallway limited distance due to MAX c/o fatigue (HgB 8.1).  Performed TKR TE's then applied ICE.   Follow Up Recommendations  Home health PT     Does the patient have the potential to tolerate intense rehabilitation     Barriers to Discharge        Equipment Recommendations  None recommended by PT    Recommendations for Other Services    Frequency 7X/week   Progress towards PT Goals Progress towards PT goals: Progressing toward goals  Plan      Precautions / Restrictions Precautions Precautions: Fall Required Braces or Orthoses: Knee Immobilizer - Right Knee Immobilizer - Right: Discontinue once straight leg raise with < 10 degree lag Restrictions Weight Bearing Restrictions: No Other Position/Activity Restrictions: WBAT    Pertinent Vitals/Pain C/o 3/10 pain    Mobility  Bed Mobility Overal bed mobility: Needs Assistance Bed Mobility: Supine to Sit Supine to sit: Mod assist General bed mobility comments: cues for sequence and increased time Transfers Overall transfer level: Needs assistance Equipment used: Rolling walker (2 wheeled) Transfers: Sit to/from Stand Sit to Stand: Min guard;Min assist General transfer comment: cues for hand placement and extending RLE Ambulation/Gait Ambulation/Gait assistance: Min assist;Mod assist Ambulation Distance (Feet): 40 Feet Assistive device: Rolling walker (2 wheeled) Gait Pattern/deviations: Step-to pattern;Decreased stance time - right Gait velocity: decreased General Gait Details: Cues for sequence, stride length, posture , and position from RW  Max c/o feeling "tiered"    Total Knee Replacement TE's 10 reps B LE ankle pumps 10 reps knee presses Followed by ICE   PT Goals (current goals can now be found in the care plan section)    Visit Information  Last PT Received On: 12/20/13 Assistance Needed: +1 History of Present Illness: pt admitted for R TKA    Subjective Data      Cognition       Balance     End of Session PT - End of Session Equipment Utilized During Treatment: Gait belt;Right knee immobilizer Activity Tolerance: Other (comment) (MAX c/o fatigue noted Hgb 8.1) Patient left: in chair;with call bell/phone within reach   Rica Koyanagi  PTA [facility]  Acute  Rehab Pager      [phone]

## 2013-12-20 NOTE — Progress Notes (Signed)
Physical Therapy Treatment Patient Details Name: Sarah [name] MRN: [id number] DOB: [birth year] Today's Date: 12/20/2013 Time: 0109-3235 PT Time Calculation (min): 26 min  PT Assessment / Plan / Recommendation  History of Present Illness pt admitted for R TKA   PT Comments   POD # 2 pm session.  Amb to and from BR then back to bed.  Pt progressing slowly with MAX c/o fatigue.   Follow Up Recommendations  Home health PT     Does the patient have the potential to tolerate intense rehabilitation     Barriers to Discharge        Equipment Recommendations  None recommended by PT    Recommendations for Other Services    Frequency 7X/week   Progress towards PT Goals Progress towards PT goals: Progressing toward goals  Plan      Precautions / Restrictions Precautions Precautions: Fall Required Braces or Orthoses: Knee Immobilizer - Right Knee Immobilizer - Right: Discontinue once straight leg raise with < 10 degree lag Restrictions Weight Bearing Restrictions: No Other Position/Activity Restrictions: WBAT    Pertinent Vitals/Pain C/o MAX fatigue    Mobility  Bed Mobility Overal bed mobility: Needs Assistance Bed Mobility: Sit to Supine Supine to sit: Mod assist Sit to supine: Min assist;Mod assist General bed mobility comments: increased assist back to bed Transfers Overall transfer level: Needs assistance Equipment used: Rolling walker (2 wheeled) Transfers: Sit to/from Stand Sit to Stand: Min guard;Min assist General transfer comment: cues for hand placement and extending RLE Ambulation/Gait Ambulation/Gait assistance: Min assist;Mod assist Ambulation Distance (Feet): 24 Feet (12 feet to and from BR) Assistive device: Rolling walker (2 wheeled) Gait Pattern/deviations: Step-to pattern;Decreased stance time - right Gait velocity: decreased General Gait Details: Cues for sequence, stride length, posture , and position from RW  Max c/o feeling "tiered"     PT  Goals (current goals can now be found in the care plan section)    Visit Information  Last PT Received On: 12/20/13 Assistance Needed: +1 History of Present Illness: pt admitted for R TKA    Subjective Data      Cognition       Balance     End of Session PT - End of Session Equipment Utilized During Treatment: Gait belt;Right knee immobilizer Activity Tolerance: Patient limited by fatigue Patient left: in bed;with call bell/phone within reach   Rica Koyanagi  PTA [facility]  Acute  Rehab Pager      [phone]

## 2013-12-20 NOTE — Evaluation (Signed)
Occupational Therapy Evaluation Patient Details Name: Sarah [name] MRN: [id number] DOB: 1940-12-21 Today's Date: 12/20/2013 Time: 0086-7619 OT Time Calculation (min): 21 min  OT Assessment / Plan / Recommendation History of present illness pt admitted for R TKA   Clinical Impression   Pt was admitted for the above surgery.  She will benefit from skilled OT to increase independence with adls in acute.  Pt will also benefit from [facility] for adls/iadls as she has only husband's support and he is nearly blind.      OT Assessment  Patient needs continued OT Services    Follow Up Recommendations  Home health OT (for adls/iadls:  limited support/husband nearly blind)    Barriers to Discharge      Equipment Recommendations  None recommended by OT (has 3;1)    Recommendations for Other Services    Frequency  Min 2X/week    Precautions / Restrictions Precautions Precautions: Fall Required Braces or Orthoses: Knee Immobilizer - Right Knee Immobilizer - Right: Discontinue once straight leg raise with < 10 degree lag Restrictions Weight Bearing Restrictions: No Other Position/Activity Restrictions: WBAT   Pertinent Vitals/Pain R knee 5/10 when standing.  Repositioned.  Declined ice    ADL  Grooming: Set up Where Assessed - Grooming: Unsupported sitting Upper Body Bathing: Set up Where Assessed - Upper Body Bathing: Unsupported sitting Lower Body Bathing: Minimal assistance Where Assessed - Lower Body Bathing: Supported sit to stand Upper Body Dressing: Set up Where Assessed - Upper Body Dressing: Unsupported sitting Lower Body Dressing: Moderate assistance Where Assessed - Lower Body Dressing: Supported sit to stand Toilet Transfer: Minimal assistance Armed forces technical officer Method: Arts development officer: Therapist, occupational and Hygiene: Supervision/safety Where Assessed - Best boy and Hygiene: Sit to stand from  3-in-1 or toilet Equipment Used: Rolling walker Transfers/Ambulation Related to ADLs: pt was nauseas yesterday.  Did not want to ambulate to commode yet.  stands with supervision ADL Comments: husband will assist with adls.  she used to have a reacher but doesn't have this anymore.  can reach to start pants:  showed her hooking L foot under R ankle to unweight heel when donning pants  educated on tub bench:  she plans to sponge bathe    OT Diagnosis: Generalized weakness  OT Problem List: Decreased strength;Impaired balance (sitting and/or standing);Pain OT Treatment Interventions: Self-care/ADL training;DME and/or AE instruction;Patient/family education   OT Goals(Current goals can be found in the care plan section) Acute Rehab OT Goals Patient Stated Goal: get back to being independent; drive OT Goal Formulation: With patient Time For Goal Achievement: 01/03/14 Potential to Achieve Goals: Good ADL Goals Pt Will Perform Grooming: with supervision;standing Pt Will Transfer to Toilet: with supervision;ambulating;bedside commode Pt Will Perform Toileting - Clothing Manipulation and hygiene: sit to/from stand;with modified independence  Visit Information  Last OT Received On: 12/20/13 Assistance Needed: +1 History of Present Illness: pt admitted for R TKA       Prior [place] expects to be discharged to:: Private residence Living Arrangements: Spouse/significant other Home Equipment: Bedside commode Additional Comments: pt is having bathroom remodeled next month.  Husband is nearly blind but can help with adls.  Neither drives.  Doesn't have a lot of support.  Daughter up from [place] for 1-2 days Prior Function Level of Independence: Independent Communication Communication: No difficulties         Vision/Perception     Cognition  Cognition Arousal/Alertness:  Awake/alert Behavior During Therapy: WFL for tasks  assessed/performed Overall Cognitive Status: Within Functional Limits for tasks assessed    Extremity/Trunk Assessment Upper Extremity Assessment Upper Extremity Assessment: Overall WFL for tasks assessed     Mobility Transfers Overall transfer level: Independent Equipment used: Rolling walker (2 wheeled) Transfers: Sit to/from Stand Sit to Stand: Supervision General transfer comment: cues for hand placement and extending RLE     Exercise     Balance     End of Session OT - End of Session Activity Tolerance: Patient tolerated treatment well Patient left: in chair;with call bell/phone within reach  [place] 12/20/2013, 10:15 AM Lesle Chris, OTR/L [phone] 12/20/2013

## 2013-12-21 LAB — CBC
HEMATOCRIT: 26.6 % — AB (ref 36.0–46.0)
Hemoglobin: 9 g/dL — ABNORMAL LOW (ref 12.0–15.0)
MCH: 29.7 pg (ref 26.0–34.0)
MCHC: 33.8 g/dL (ref 30.0–36.0)
MCV: 87.8 fL (ref 78.0–100.0)
Platelets: 100 10*3/uL — ABNORMAL LOW (ref 150–400)
RBC: 3.03 MIL/uL — ABNORMAL LOW (ref 3.87–5.11)
RDW: 13.5 % (ref 11.5–15.5)
WBC: 9.3 10*3/uL (ref 4.0–10.5)

## 2013-12-21 NOTE — Progress Notes (Signed)
   Subjective: 3 Days Post-Op Procedure(s) (LRB): RIGHT TOTAL KNEE ARTHROPLASTY (Right)   Patient reports pain as mild, pain controlled. No events throughout the night. Ready to be discharged home.  Objective:   VITALS:   Filed Vitals:   12/21/13  BP: 135/60  Pulse: 91  Temp: 99.2 F (37.3 C)   Resp: 18    Neurovascular intact Dorsiflexion/Plantar flexion intact Incision: dressing C/D/I No cellulitis present Compartment soft  LABS  Recent Labs  12/19/13 0550 12/20/13 0514 12/20/13 1335 12/21/13 0732  HGB 10.3* 8.1* 7.8* 9.0*  HCT 30.7* 23.1* 22.8* 26.6*  WBC 12.9* 10.6*  --  9.3  PLT 144* 114*  --  100*     Recent Labs  12/19/13 0550 12/20/13 0514  NA 137 138  K 4.4 4.3  BUN 10 10  CREATININE 0.60 0.66  GLUCOSE 163* 143*     Assessment/Plan: 3 Days Post-Op Procedure(s) (LRB): RIGHT TOTAL KNEE ARTHROPLASTY (Right) Up with therapy Discharge home with home health Follow up in 2 weeks at [facility]. Follow up with Dr. Gladstone Lighter in 2 weeks.  Contact information:  [facility] [address], [address] [place] Robin Pafford   PAC  12/21/2013, 10:13 AM

## 2013-12-21 NOTE — Progress Notes (Signed)
Physical Therapy Treatment Patient Details Name: Sarah [name] MRN: [id number] DOB: Aug 01, 1941 Today's Date: 12/21/2013 Time: 6269-4854 PT Time Calculation (min): 28 min  PT Assessment / Plan / Recommendation  History of Present Illness pt admitted for R TKA   PT Comments   POD # 3 pt planning to D/C to home today.  Amb in hallway then practiced stairs.  Performed TKR TE's following handout.  ICE applied.   Follow Up Recommendations  Home health PT     Does the patient have the potential to tolerate intense rehabilitation     Barriers to Discharge        Equipment Recommendations       Recommendations for Other Services    Frequency 7X/week   Progress towards PT Goals Progress towards PT goals: Progressing toward goals  Plan      Precautions / Restrictions Precautions Precautions: Fall Required Braces or Orthoses: Knee Immobilizer - Right Knee Immobilizer - Right: Discontinue once straight leg raise with < 10 degree lag Restrictions Weight Bearing Restrictions: No Other Position/Activity Restrictions: WBAT    Pertinent Vitals/Pain C/o 5/10 pain ICE applied    Mobility  Bed Mobility General bed mobility comments: Pt OOB in recliner Transfers Overall transfer level: Needs assistance Equipment used: Rolling walker (2 wheeled) Transfers: Sit to/from Stand Sit to Stand: Supervision;Min guard General transfer comment: cues for hand placement and extending RLE Ambulation/Gait Ambulation/Gait assistance: Supervision;Min guard Ambulation Distance (Feet): 30 Feet Assistive device: Rolling walker (2 wheeled) Gait Pattern/deviations: Step-to pattern Gait velocity: decreased General Gait Details: Cues for sequence, stride length, posture , and position from RW  Max c/o feeling "tiered" Stairs: Yes Stairs assistance: Min guard Stair Management: One rail Left;Forwards Number of Stairs: 2 General stair comments: Instructions on proper sequencing and safety     Exercises   Total Knee Replacement TE's 10 reps B LE ankle pumps 10 reps knee presses 10 reps heel slides  10 reps SAQ's 10 reps SLR's 10 reps ABD Followed by ICE   PT Goals (current goals can now be found in the care plan section)    Visit Information  Last PT Received On: 12/21/13 Assistance Needed: +1 History of Present Illness: pt admitted for R TKA    Subjective Data      Cognition       Balance     End of Session PT - End of Session Equipment Utilized During Treatment: Gait belt;Right knee immobilizer Activity Tolerance: Patient tolerated treatment well Patient left: in chair;with call bell/phone within reach   Rica Koyanagi  PTA [facility]  Acute  Rehab Pager      [phone]

## 2013-12-21 NOTE — Progress Notes (Signed)
Pt stable, scripts, d/c instructions given with no questions/concerns voiced by pt or family.  Pt transported via wheelchair to private vehicle by NT and family.

## 2013-12-23 LAB — TYPE AND SCREEN
ABO/RH(D): O POS
Antibody Screen: NEGATIVE
Unit division: 0
Unit division: 0

## 2013-12-23 NOTE — Discharge Summary (Signed)
Physician Discharge Summary  Patient ID: CLARITY CISZEK MRN: [id number] DOB/AGE: 08/30/1941 73 y.o.  Admit date: 12/18/2013 Discharge date: 12/21/2013   Procedures:  Procedure(s) (LRB): RIGHT TOTAL KNEE ARTHROPLASTY (Right)  Attending Physician:  Dr. Paralee Cancel   Admission Diagnoses:   Right knee pain  Discharge Diagnoses:  Active Problems:   Osteoarthritis of right knee   Obesity, unspecified   History of total knee arthroplasty   Postoperative anemia due to acute blood loss   Acute blood loss anemia  Past Medical History  Diagnosis Date  . Hypertension   . Syncope, near 1 year ago  . Obesity   . Hypothyroidism   . Degenerative arthritis   . Anemia   . Headache(784.0)     sinus    HPI: Sarah [name], 73 y.o. female, has a history of pain and functional disability in the right knee due to arthritis and has failed non-surgical conservative treatments for greater than 12 weeks to includeNSAID's and/or analgesics, corticosteriod injections, viscosupplementation injections and activity modification. Onset of symptoms was gradual, starting 1 year ago with gradually worsening course since that time. The patient noted prior procedures on the knee to include arthroscopy and menisectomy on the right knee(s). Patient currently rates pain in the right knee(s) at 7 out of 10 with activity. Patient has night pain, worsening of pain with activity and weight bearing, pain that interferes with activities of daily living, pain with passive range of motion, crepitus and joint swelling. Patient has evidence of periarticular osteophytes and joint space narrowing by imaging studies. There is no active infection.  PCP: Woody Seller, MD   Discharged Condition: good  Hospital Course:  Patient underwent the above stated procedure on 12/18/2013. Patient tolerated the procedure well and brought to the recovery room in good condition and subsequently to the floor.  POD #1 BP: 130/78 ;  Pulse: 79 ; Temp: 98.4 F (36.9 C) ; Resp: 16  Patient reports pain as 6 on 0-10 scale Doing well. Will DC Drain.  Dorsiflexion/Plantar flexion intact   LABS  Basename    HGB  10.3  HCT  30.7   POD #2  BP: 117/74 ; Pulse: 96 ; Temp: 99.1 F (37.5 C) ; Resp: 18  Patient reports pain as 2 on 0-10 scale.Doing Well today. Hbg 8.1. Afebrile.  Dorsiflexion/Plantar flexion intact   LABS  Basename    HGB  8.1  HCT  23.1   POD #3  BP: 135/60 ; Pulse: 91 ; Temp: 99.2 F (37.3 C) ; Resp: 18  Patient reports pain as mild, pain controlled. No events throughout the night. Ready to be discharged home. Neurovascular intact, dorsiflexion/plantar flexion intact, incision: dressing C/D/I, no cellulitis present and compartment soft.   LABS  Basename    HGB  9.0  HCT  26.6   Discharge Exam: General appearance: alert, cooperative and no distress Extremities: Homans sign is negative, no sign of DVT, no edema, redness or tenderness in the calves or thighs and no ulcers, gangrene or trophic changes  Disposition:     Home or Self Care with follow up in 2 weeks   Follow-up Information   Follow up with GIOFFRE,RONALD A, MD. Schedule an appointment as soon as possible for a visit in 2 weeks.   Specialty:  Orthopedic Surgery   Contact information:   [address] [place] [address] [phone]       Discharge Orders   Future Orders Complete By Expires  Call MD / Call 911  As directed    Comments:     If you experience chest pain or shortness of breath, CALL 911 and be transported to the hospital emergency room.  If you develope a fever above 101 F, pus (white drainage) or increased drainage or redness at the wound, or calf pain, call your surgeon's office.   Constipation Prevention  As directed    Comments:     Drink plenty of fluids.  Prune juice may be helpful.  You may use a stool softener, such as Colace (over the counter) 100 mg twice a day.  Use MiraLax (over the  counter) for constipation as needed.   Diet - low sodium heart healthy  As directed    Discharge instructions  As directed    Comments:     Walk with your walker. Weightbearing as tolerated [place] will follow you at home for your therapy  Do not change your dressing unless there is excess drainage Shower only, no tub bath. Call if any temperatures greater than 101 or any wound complications: [phone] during the day and ask for Dr. Charlestine Night nurse, Brunilda Payor.   Do not put a pillow under the knee. Place it under the heel.  As directed    Driving restrictions  As directed    Comments:     No driving   Increase activity slowly as tolerated  As directed         Medication List    STOP taking these medications       b complex vitamins tablet     CALCIUM PO     VITAMIN B-12 PO      TAKE these medications       estradiol 1 MG tablet  Commonly known as:  ESTRACE  Take 1 mg by mouth daily with breakfast.     fexofenadine-pseudoephedrine 180-240 MG per 24 hr tablet  Commonly known as:  ALLEGRA-D 24  Take 1 tablet by mouth every evening.     HYDROmorphone 2 MG tablet  Commonly known as:  DILAUDID  Take 1 tablet (2 mg total) by mouth every 3 (three) hours as needed for severe pain (Q4-6 hours PRN).     IRON PO  Take 1 tablet by mouth daily.     levothyroxine 75 MCG tablet  Commonly known as:  SYNTHROID, LEVOTHROID  Take 75 mcg by mouth every morning.     losartan 50 MG tablet  Commonly known as:  COZAAR  Take 50 mg by mouth daily with breakfast.     methocarbamol 500 MG tablet  Commonly known as:  ROBAXIN  Take 1 tablet (500 mg total) by mouth every 6 (six) hours as needed for muscle spasms.     ondansetron 4 MG tablet  Commonly known as:  ZOFRAN  Take 1 tablet (4 mg total) by mouth every 6 (six) hours as needed for nausea.     rivaroxaban 10 MG Tabs tablet  Commonly known as:  XARELTO  Take 1 tablet (10 mg total) by mouth daily with breakfast.           Signed: [place] Pugh. Jannah Guardiola   PAC  12/23/2013, 9:45 AM

## 2014-02-19 ENCOUNTER — Other Ambulatory Visit: Payer: Self-pay | Admitting: Family Medicine

## 2014-02-19 DIAGNOSIS — R109 Unspecified abdominal pain: Secondary | ICD-10-CM

## 2014-02-20 ENCOUNTER — Ambulatory Visit
Admission: RE | Admit: 2014-02-20 | Discharge: 2014-02-20 | Disposition: A | Discharge status: Home / Self Care | Payer: Medicare Other | Source: Ambulatory Visit | Attending: Family Medicine | Admitting: Family Medicine

## 2014-02-20 DIAGNOSIS — R109 Unspecified abdominal pain: Secondary | ICD-10-CM

## 2015-01-14 IMAGING — CR DG KNEE COMPLETE 4+V*R*
5 series · 5 of 5 positions shown · non-contrast
Comparison: None.

CLINICAL DATA: Medial knee pain, no known injury

RIGHT KNEE - COMPLETE 4+ VIEW

[t knee ap right]
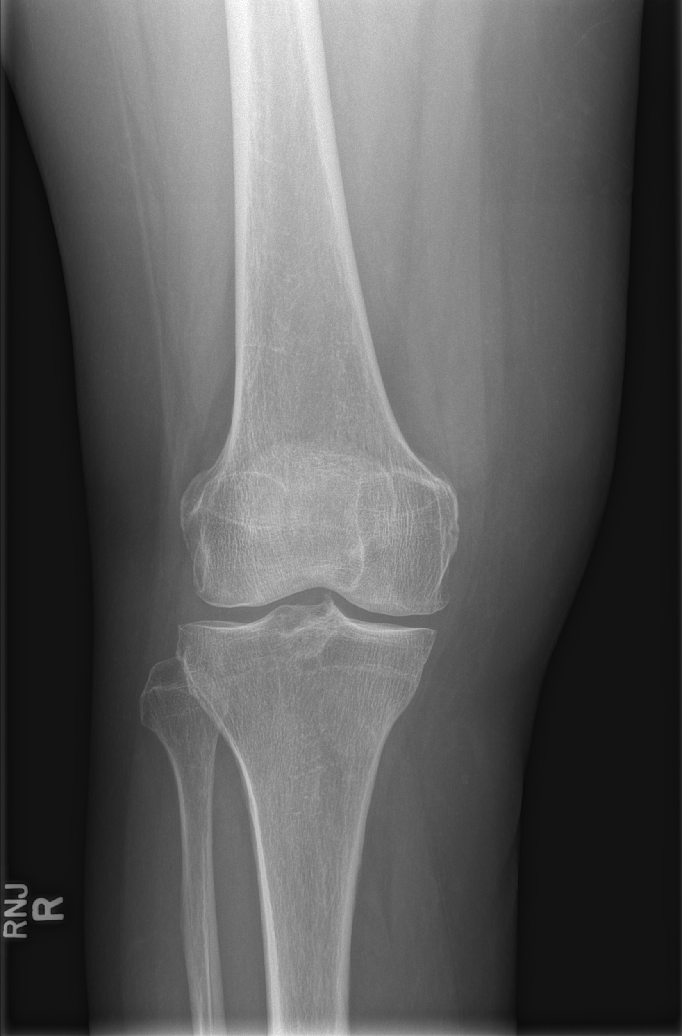

[t knee obl right (1 of 2)]
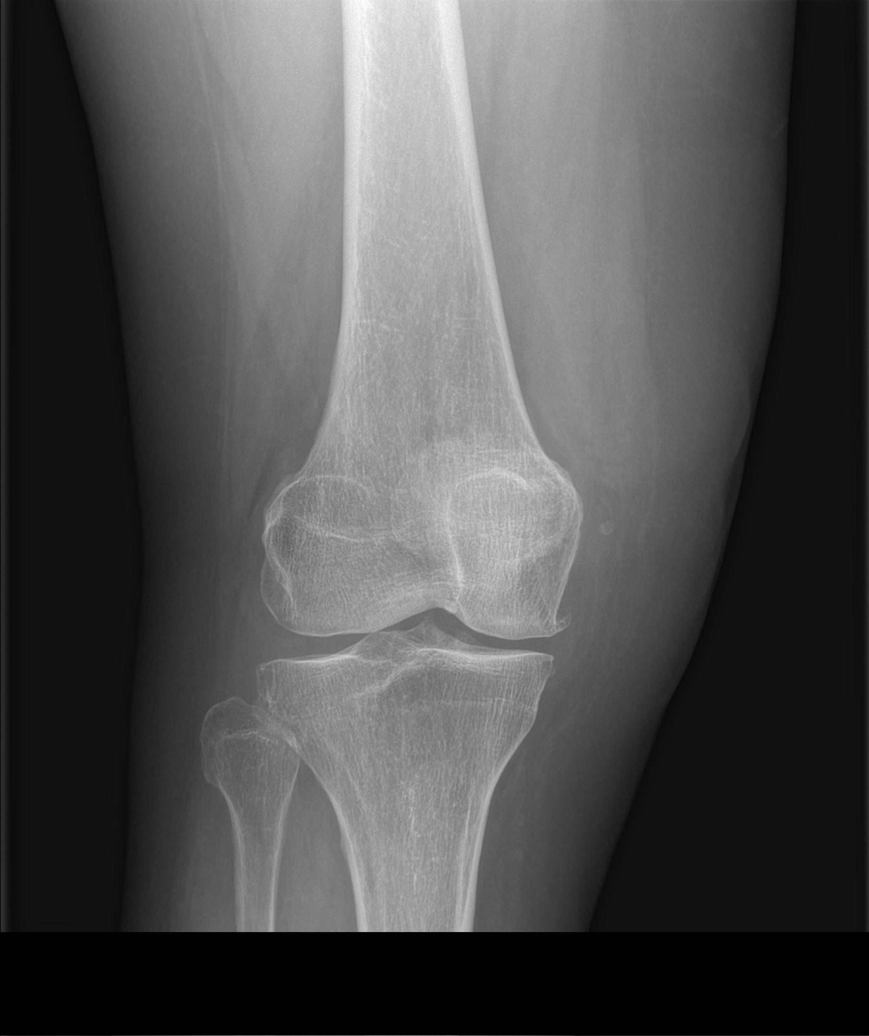

[t knee obl right (2 of 2)]
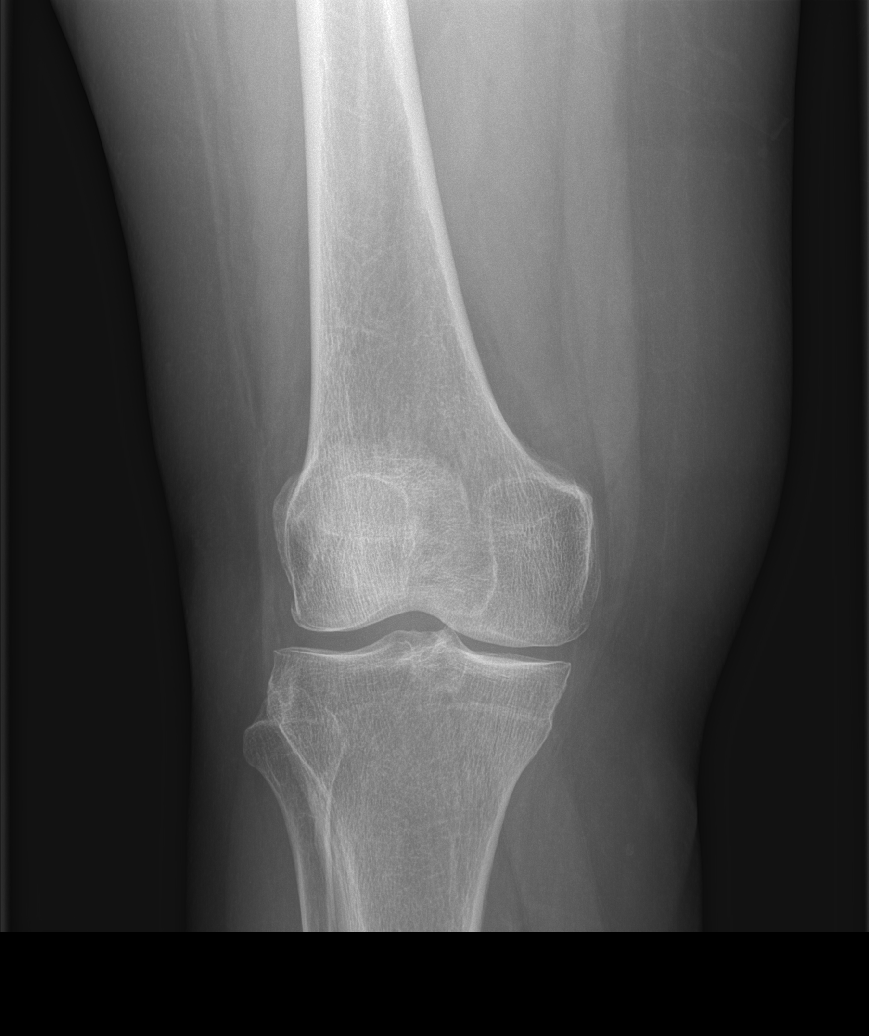

[t knee lat right (1 of 2)]
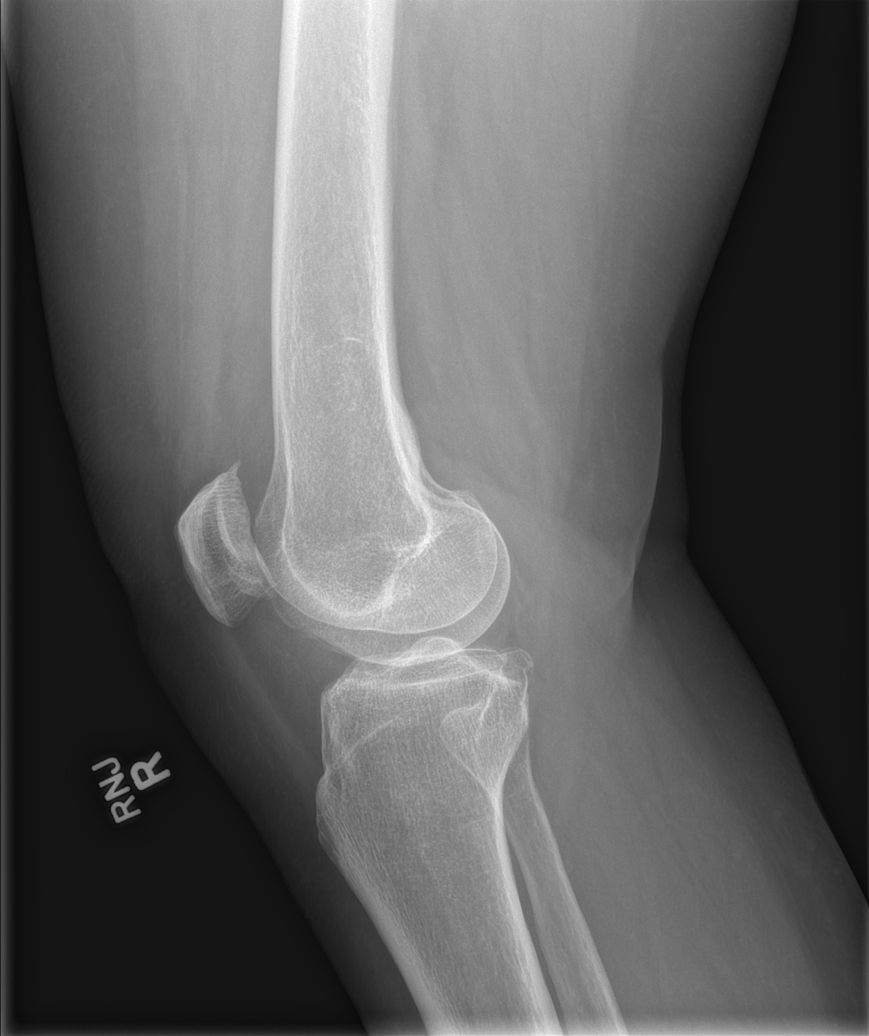

[t knee lat right (2 of 2)]
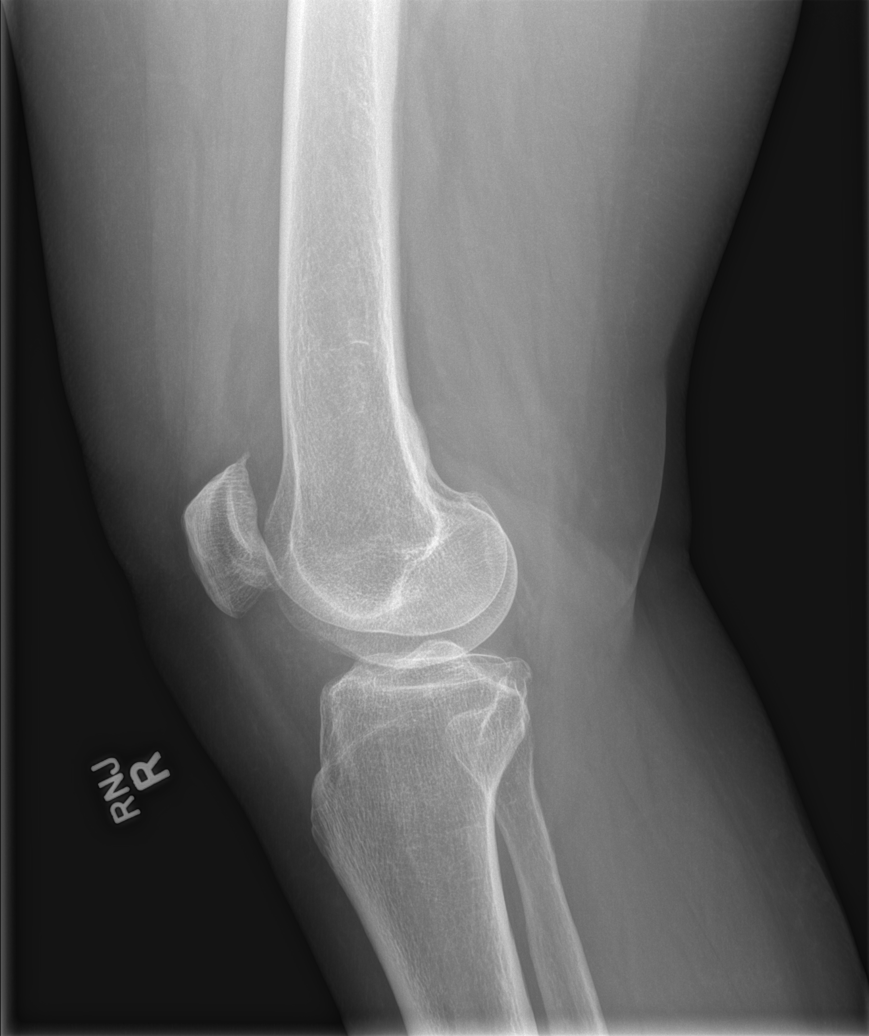

[5 of 5 positions shown; findings below may reference images not displayed]

FINDINGS: No fracture or dislocation.  No joint effusion.  Mild to moderate
tricompartmental degenerative change, worse with the medial
compartment with joint space loss, subchondral sclerosis and
osteophytosis.  There is minimal spurring of the tibial spines.  No
evidence of chondrocalcinosis.  Regional soft tissues are normal.
IMPRESSION: 1.  No acute findings
2.  Mild to moderate tricompartmental degenerative change, worse
within the medial compartment.

## 2015-02-18 ENCOUNTER — Encounter: Payer: Self-pay | Admitting: Podiatry

## 2015-02-18 ENCOUNTER — Ambulatory Visit (INDEPENDENT_AMBULATORY_CARE_PROVIDER_SITE_OTHER): Payer: Medicare Other | Admitting: Podiatry

## 2015-02-18 VITALS — BP 122/72 | HR 70 | Resp 12

## 2015-02-18 DIAGNOSIS — L600 Ingrowing nail: Secondary | ICD-10-CM | POA: Diagnosis not present

## 2015-02-18 NOTE — Progress Notes (Signed)
   Subjective:    Patient ID: Sarah [name], female    DOB: 1941-08-25, 74 y.o.   MRN: [id number]  HPI  N-SORE L-RT FOOT 1ST TOENAIL D-2 MONTHS O-SLOWLY C-WORSE A-SHOES T-SOAK EPSON SALT   Review of Systems  All other systems reviewed and are negative.      Objective:   Physical Exam  Orientated 3  Vascular: DP pulses 2/4 bilaterally PT pulses 2/4 bilaterally Capillary reflex immediate bilaterally Mild nonpitting edema ankles bilaterally  Neurological: Sensation to 10 g monofilament wire intact 5/5 bilaterally Vibratory sensation intact bilaterally  Dermatological: The medial margin of the right hallux nail is incurvated with a callused nail groove and tender to palpation. This duplicates area of patient's discomfort. The left hallux nail is dystrophic The second left nail is not present  Musculoskeletal: HAV deformities bilaterally There is no restriction ankle, subtalar, midtarsal joints bilaterally      Assessment & Plan:   Assessment: Satisfactory neurovascular status bilaterally Ingrowing medial margin of the right hallux toenail  Plan: Discuss treatment options including nonsurgical debridement, or permanent nail removal of the margin. Patient verbally consents to permanent removal of the medial margin of the right hallux toenail.  The right hallux was blocked with 3 mL of 50-50 mixture of 2% plain Xylocaine and 0.5% plain Marcaine. The right hallux was painted with Betadine and exsanguinated. The medial margin of the right hallux nail was excised and a phenol matricectomy performed. An antibiotic compression dressing was applied. The tourniquet was released and spontaneous capillary filling times noted in the right hallux.  Patient tolerated procedure without any difficulty. Postoperative oral instructions provided.  Reappoint at patient's request

## 2015-02-18 NOTE — Patient Instructions (Addendum)
If pain relief is needed use over-the-counter Tylenol  ANTIBACTERIAL SOAP INSTRUCTIONS  THE DAY AFTER PROCEDURE  Please follow the instructions your doctor has marked.   Shower as usual. Before getting out, place a drop of antibacterial liquid soap (Dial) on a wet, clean washcloth.  Gently wipe washcloth over affected area.  Afterward, rinse the area with warm water.  Blot the area dry with a soft cloth and cover with antibiotic ointment (neosporin, polysporin, bacitracin) and band aid or gauze and tape  Place 3-4 drops of antibacterial liquid soap in a quart of warm tap water.  Submerge foot into water for 20 minutes.  If bandage was applied after your procedure, leave on to allow for easy lift off, then remove and continue with soak for the remaining time.  Next, blot area dry with a soft cloth and cover with a bandage.  Apply other medications as directed by your doctor, such as cortisporin otic solution (eardrops) or neosporin antibiotic ointment

## 2015-08-03 ENCOUNTER — Encounter: Payer: Self-pay | Admitting: Cardiovascular Disease

## 2015-08-17 ENCOUNTER — Other Ambulatory Visit: Payer: Self-pay | Admitting: Gastroenterology

## 2015-08-17 DIAGNOSIS — R1011 Right upper quadrant pain: Secondary | ICD-10-CM

## 2015-08-20 ENCOUNTER — Ambulatory Visit
Admission: RE | Admit: 2015-08-20 | Discharge: 2015-08-20 | Disposition: A | Discharge status: Home / Self Care | Payer: Medicare Other | Source: Ambulatory Visit | Attending: Gastroenterology | Admitting: Gastroenterology

## 2015-08-20 DIAGNOSIS — R1011 Right upper quadrant pain: Secondary | ICD-10-CM

## 2015-10-15 ENCOUNTER — Other Ambulatory Visit: Payer: Self-pay | Admitting: Obstetrics and Gynecology

## 2015-10-16 LAB — CYTOLOGY - PAP

## 2015-10-20 ENCOUNTER — Other Ambulatory Visit: Payer: Self-pay | Admitting: Obstetrics and Gynecology

## 2015-10-20 DIAGNOSIS — R928 Other abnormal and inconclusive findings on diagnostic imaging of breast: Secondary | ICD-10-CM

## 2015-10-27 ENCOUNTER — Ambulatory Visit
Admission: RE | Admit: 2015-10-27 | Discharge: 2015-10-27 | Disposition: A | Discharge status: Home / Self Care | Payer: Medicare Other | Source: Ambulatory Visit | Attending: Obstetrics and Gynecology | Admitting: Obstetrics and Gynecology

## 2015-10-27 DIAGNOSIS — R928 Other abnormal and inconclusive findings on diagnostic imaging of breast: Secondary | ICD-10-CM

## 2016-03-03 DIAGNOSIS — Z8673 Personal history of transient ischemic attack (TIA), and cerebral infarction without residual deficits: Secondary | ICD-10-CM

## 2016-09-23 ENCOUNTER — Observation Stay (HOSPITAL_COMMUNITY)
Admission: EM | Admit: 2016-09-23 | Discharge: 2016-09-26 | Disposition: A | Discharge status: Home / Self Care | Payer: Medicare Other | Attending: Family Medicine | Admitting: Family Medicine

## 2016-09-23 ENCOUNTER — Emergency Department (HOSPITAL_COMMUNITY): Payer: Medicare Other

## 2016-09-23 ENCOUNTER — Encounter (HOSPITAL_COMMUNITY): Payer: Self-pay | Admitting: Emergency Medicine

## 2016-09-23 DIAGNOSIS — E039 Hypothyroidism, unspecified: Secondary | ICD-10-CM | POA: Insufficient documentation

## 2016-09-23 DIAGNOSIS — Z96651 Presence of right artificial knee joint: Secondary | ICD-10-CM | POA: Insufficient documentation

## 2016-09-23 DIAGNOSIS — Z8249 Family history of ischemic heart disease and other diseases of the circulatory system: Secondary | ICD-10-CM | POA: Diagnosis not present

## 2016-09-23 DIAGNOSIS — R9439 Abnormal result of other cardiovascular function study: Secondary | ICD-10-CM

## 2016-09-23 DIAGNOSIS — I110 Hypertensive heart disease with heart failure: Secondary | ICD-10-CM | POA: Diagnosis not present

## 2016-09-23 DIAGNOSIS — K449 Diaphragmatic hernia without obstruction or gangrene: Secondary | ICD-10-CM | POA: Insufficient documentation

## 2016-09-23 DIAGNOSIS — I25110 Atherosclerotic heart disease of native coronary artery with unstable angina pectoris: Secondary | ICD-10-CM | POA: Diagnosis not present

## 2016-09-23 DIAGNOSIS — M1990 Unspecified osteoarthritis, unspecified site: Secondary | ICD-10-CM | POA: Insufficient documentation

## 2016-09-23 DIAGNOSIS — Z6832 Body mass index (BMI) 32.0-32.9, adult: Secondary | ICD-10-CM | POA: Insufficient documentation

## 2016-09-23 DIAGNOSIS — I10 Essential (primary) hypertension: Secondary | ICD-10-CM | POA: Diagnosis present

## 2016-09-23 DIAGNOSIS — I509 Heart failure, unspecified: Secondary | ICD-10-CM | POA: Diagnosis not present

## 2016-09-23 DIAGNOSIS — R079 Chest pain, unspecified: Secondary | ICD-10-CM | POA: Diagnosis present

## 2016-09-23 DIAGNOSIS — K219 Gastro-esophageal reflux disease without esophagitis: Secondary | ICD-10-CM | POA: Diagnosis not present

## 2016-09-23 DIAGNOSIS — Z880 Allergy status to penicillin: Secondary | ICD-10-CM | POA: Insufficient documentation

## 2016-09-23 DIAGNOSIS — E669 Obesity, unspecified: Secondary | ICD-10-CM | POA: Insufficient documentation

## 2016-09-23 DIAGNOSIS — M79662 Pain in left lower leg: Secondary | ICD-10-CM

## 2016-09-23 DIAGNOSIS — R0789 Other chest pain: Secondary | ICD-10-CM

## 2016-09-23 LAB — I-STAT CG4 LACTIC ACID, ED: LACTIC ACID, VENOUS: 1.09 mmol/L (ref 0.5–1.9)

## 2016-09-23 LAB — I-STAT TROPONIN, ED: Troponin i, poc: 0 ng/mL (ref 0.00–0.08)

## 2016-09-23 MED ORDER — GI COCKTAIL ~~LOC~~
30.0000 mL | Freq: Once | ORAL | Status: AC
  Administered 2016-09-23: 30 mL via ORAL
  Filled 2016-09-23: qty 30

## 2016-09-23 NOTE — ED Triage Notes (Signed)
Pt presents from home with [facility] for centralized sharp CP that began at 1900 tonight rated as 10/10, also back pain and L arm pain; pt states she thought it was indigestion and took zantac without relief; pt reports vomiting 4x prior to EMS arrival; pt took 325mg  ASA prior to EMS arrival; PIV initiated by EMS enroute and 350ccNS given and 4 SL NTG; pain now 4/10; pt reported dizziness with standing to get on EMS stretcher on scene

## 2016-09-23 NOTE — ED Provider Notes (Signed)
[place] DEPT Provider Note   CSN: [id number] Arrival date & time: 09/23/16  2321 By signing my name below, I, Georgette Shell, attest that this documentation has been prepared under the direction and in the presence of Duffy Bruce, MD. Electronically Signed: Georgette Shell, ED Scribe. 09/23/16. 11:33 PM.  History   Chief Complaint Chief Complaint  Patient presents with  . Chest Pain  . Nausea  . Emesis   HPI Comments: Sarah [name] is a 75 y.o. female with h/o HTN and GERD who presents to the Emergency Department by EMS complaining of sharp, aching, 10/10 centralized chest pain radiating to her left shoulder and left arm onset 7 pm tonight. She also has associated back pain, vomiting (x4 episodes) and diaphoresis. Pt was sitting down at the time of onset. Pt has h/o GERD and notes that this pain is worse. She took a Zantac and Aspirin with no relief. Pt was given 4 SL NTG by EMS with moderate relief. Denies h/o similar symptoms. Denies h/o MI, PE, or DVT. Pt further denies shortness of breath, fever, or any other associated symptoms.  The history is provided by the patient. No language interpreter was used.    Past Medical History:  Diagnosis Date  . Anemia   . Degenerative arthritis   . Headache(784.0)    sinus  . Hypertension   . Hypothyroidism   . Obesity   . Syncope, near 1 year ago    Patient Active Problem List   Diagnosis Date Noted  . Postoperative anemia due to acute blood loss 12/20/2013  . Acute blood loss anemia 12/20/2013  . Osteoarthritis of right knee 12/18/2013  . Obesity, unspecified 12/18/2013  . History of total knee arthroplasty 12/18/2013  . Headache(784.0) 01/07/2013  . Syncope and collapse 01/07/2013    Past Surgical History:  Procedure Laterality Date  . ABDOMINAL HYSTERECTOMY     partial  . arthroscopic knee surgery  July 03, 2013  . CARPAL TUNNEL RELEASE Bilateral   . CATARACT EXTRACTION, BILATERAL    . CERVICAL SPINE SURGERY    . cyst  removed from eyebrow  4-5 yrs ago  . LUMBAR SPINE SURGERY    . TONSILLECTOMY  age 55 or 89  . TOTAL KNEE ARTHROPLASTY Right 12/18/2013   Procedure: RIGHT TOTAL KNEE ARTHROPLASTY;  Surgeon: Tobi Bastos, MD;  Location: [facility] ORS;  Service: Orthopedics;  Laterality: Right;    OB History    No data available       Home Medications    Prior to Admission medications   Medication Sig Start Date End Date Taking? Authorizing Provider  estradiol (ESTRACE) 1 MG tablet Take 1 mg by mouth daily with breakfast.  05/07/13   Historical Provider, MD  fexofenadine-pseudoephedrine (ALLEGRA-D 24) 180-240 MG per 24 hr tablet Take 1 tablet by mouth every evening.     Historical Provider, MD  fluticasone Asencion Islam) 50 MCG/ACT nasal spray  12/08/14   Historical Provider, MD  HYDROmorphone (DILAUDID) 2 MG tablet Take 1 tablet (2 mg total) by mouth every 3 (three) hours as needed for severe pain (Q4-6 hours PRN). 12/19/13   Amber Constable, PA-C  IRON PO Take 1 tablet by mouth daily.    Historical Provider, MD  levothyroxine (SYNTHROID, LEVOTHROID) 75 MCG tablet Take 75 mcg by mouth every morning.  04/08/13   Historical Provider, MD  losartan (COZAAR) 50 MG tablet Take 50 mg by mouth daily with breakfast.    Historical Provider, MD  methocarbamol (ROBAXIN) 500 MG  tablet Take 1 tablet (500 mg total) by mouth every 6 (six) hours as needed for muscle spasms. 12/19/13   Amber Constable, PA-C  ondansetron (ZOFRAN) 4 MG tablet Take 1 tablet (4 mg total) by mouth every 6 (six) hours as needed for nausea. 12/20/13   Ardeen Jourdain, PA-C    Family History Family History  Problem Relation Age of Onset  . Alzheimer's disease Sister   . Cancer - Lung Sister   . Heart disease Brother   . Heart disease Brother   . Heart disease Brother   . Cancer Brother     Bladder cancer    Social History Social History  Substance Use Topics  . Smoking status: Never Smoker  . Smokeless tobacco: Never Used  . Alcohol use No      Allergies   Adhesive [tape]; Other; Oxycodone-acetaminophen; Penicillins; and Voltaren [diclofenac sodium]   Review of Systems Review of Systems  Constitutional: Positive for diaphoresis and fatigue.  HENT: Negative for congestion and rhinorrhea.   Eyes: Negative for visual disturbance.  Respiratory: Negative for cough, shortness of breath and wheezing.   Cardiovascular: Positive for chest pain. Negative for leg swelling.  Gastrointestinal: Positive for nausea and vomiting. Negative for abdominal pain and diarrhea.  Genitourinary: Negative for dysuria and flank pain.  Musculoskeletal: Positive for back pain and myalgias.  Skin: Negative for rash.  Neurological: Negative for syncope, weakness and headaches.     Physical Exam Updated Vital Signs BP 116/59   Pulse 70   Temp 97.8 F (36.6 C) (Oral)   Resp 14   Ht 5\' 7"  (1.702 m)   Wt 200 lb (90.7 kg)   SpO2 98%   BMI 31.32 kg/m   Physical Exam  Constitutional: She is oriented to person, place, and time. She appears well-developed and well-nourished. No distress.  HENT:  Head: Normocephalic and atraumatic.  Eyes: Conjunctivae are normal.  Neck: Neck supple.  Cardiovascular: Normal rate, regular rhythm and normal heart sounds.  Exam reveals no friction rub.   No murmur heard. Pulmonary/Chest: Effort normal and breath sounds normal. No respiratory distress. She has no wheezes. She has no rales.  Abdominal: Soft. Bowel sounds are normal. She exhibits no distension. There is no tenderness.  Musculoskeletal: She exhibits no edema.  Neurological: She is alert and oriented to person, place, and time. She exhibits normal muscle tone.  Skin: Skin is warm. Capillary refill takes less than 2 seconds.  Psychiatric: She has a normal mood and affect.  Nursing note and vitals reviewed.    ED Treatments / Results  DIAGNOSTIC STUDIES: Oxygen Saturation is 94% on RA, poor by my interpretation.    COORDINATION OF CARE: 11:33 PM  Discussed treatment plan with pt at bedside which includes lab work, blood work, and EKG and pt agreed to plan.  Labs (all labs ordered are listed, but only abnormal results are displayed) Labs Reviewed  COMPREHENSIVE METABOLIC PANEL - Abnormal; Notable for the following:       Result Value   Glucose, Bld 111 (*)    Calcium 8.7 (*)    Total Protein 6.0 (*)    Albumin 3.4 (*)    All other components within normal limits  D-DIMER, QUANTITATIVE (NOT AT [facility]) - Abnormal; Notable for the following:    D-Dimer, Quant 0.77 (*)    All other components within normal limits  LIPID PANEL - Abnormal; Notable for the following:    LDL Cholesterol 103 (*)    All other components  within normal limits  CBC WITH DIFFERENTIAL/PLATELET  LIPASE, BLOOD  TROPONIN I  TROPONIN I  TROPONIN I  TSH  PROTIME-INR  HEMOGLOBIN A1C  I-STAT CG4 LACTIC ACID, ED  I-STAT TROPOININ, ED    EKG  EKG Interpretation  Date/Time:  Friday September 23 2016 23:31:00 EDT Ventricular Rate:  72 PR Interval:    QRS Duration: 99 QT Interval:  391 QTC Calculation: 428 R Axis:   16 Text Interpretation:  Sinus rhythm No significant change since 12/11/2013 Confirmed by Silvina Hackleman MD, Lysbeth Galas [phone]) on 09/23/2016 11:57:15 PM       Radiology Dg Chest 2 View  Result Date: 09/24/2016 CLINICAL DATA:  Severe sharp aching centralized chest pain radiating to LEFT arm beginning at 7 p.m. tonight. Associated back pain, vomiting and diaphoresis. History of hypertension or reflux disease. EXAM: CHEST  2 VIEW COMPARISON:  Chest radiograph December 11, 2013 FINDINGS: Cardiomediastinal silhouette is normal. No pleural effusions or focal consolidations. Mild chronic bronchitic changes. Biapical scarring. Trachea projects midline and there is no pneumothorax. Soft tissue planes and included osseous structures are non-suspicious. ACDF. PLIF. IMPRESSION: Mild chronic bronchitic changes without acute cardiopulmonary process. Electronically Signed    By: Elon Alas M.D.   On: 09/24/2016 00:36   Ct Angio Chest Pe W Or Wo Contrast  Result Date: 09/24/2016 CLINICAL DATA:  Chest and LEFT calf pain. Elevated D-dimer. Shortness of breath. History of hypertension. EXAM: CT ANGIOGRAPHY CHEST WITH CONTRAST TECHNIQUE: Multidetector CT imaging of the chest was performed using the standard protocol during bolus administration of intravenous contrast. Multiplanar CT image reconstructions and MIPs were obtained to evaluate the vascular anatomy. CONTRAST:  100 cc Isovue 370 COMPARISON:  Chest radiograph September 23, 2016 FINDINGS: CARDIOVASCULAR: Adequate contrast opacification of the pulmonary artery's. Main pulmonary artery is not enlarged. No pulmonary arterial filling defects to the level of the subsegmental branches. Heart size is normal, no right heart strain. Mild coronary artery calcifications. No pericardial effusions. Thoracic aorta is normal course and caliber, trace calcific atherosclerosis. MEDIASTINUM/NODES: 11 mm short axis RIGHT hilar lymph node. Sub cm LEFT hilar lymph nodes. LUNGS/PLEURA: Tracheobronchial tree is patent though, demonstrates diffuse bronchial wall thickening, no pneumothorax. Reticular nodular scarring in lung apices bilaterally. Bibasilar dependent atelectasis. Mild bibasilar interlobular septal wall thickening associated with CHF. UPPER ABDOMEN: Small hiatal hernia.  Calcified hepatic granulomas. MUSCULOSKELETAL: Visualized soft tissues and included osseous structures appear normal. ACDF partially imaged. Review of the MIP images confirms the above findings. IMPRESSION: No acute pulmonary embolism. Bronchial wall thickening can be seen with reactive airway disease or or bronchitis without focal pneumonia. Mild RIGHT hilar lymphadenopathy is likely reactive. Interlobular septal wall thickening associated with CHF. Electronically Signed   By: Elon Alas M.D.   On: 09/24/2016 06:23    Procedures Procedures (including  critical care time)  Medications Ordered in ED Medications  gi cocktail (Maalox,Lidocaine,Donnatal) (30 mLs Oral Given 09/23/16 2337)     Initial Impression / Assessment and Plan / ED Course  I have reviewed the triage vital signs and the nursing notes.  Pertinent labs & imaging results that were available during my care of the patient were reviewed by me and considered in my medical decision making (see chart for details).  Clinical Course   75 yo F with PMHx of HTN, GERD who p/w acute onset sharp, pleuritic CP with nausea, vomiting. EKG non-ischemic. VSS and WNL on arrival. Exam is unremarkable. Initial labs unremarkable - lipase normal, trop negative, CXR is clear. DDx includes  PE, ACS, possibly gastritis/GERD. CTA PE subsequently obtained and is negative. Pt has resolution of pain with nitro. Given high risk features, with HEART>3, will admit for high risk eval and rule out. Pt in agreement.   Final Clinical Impressions(s) / ED Diagnoses   Final diagnoses:  Chest pain    I personally performed the services described in this documentation, which was scribed in my presence. The recorded information has been reviewed and is accurate.     Duffy Bruce, MD 09/24/16 [phone]

## 2016-09-24 ENCOUNTER — Observation Stay (HOSPITAL_BASED_OUTPATIENT_CLINIC_OR_DEPARTMENT_OTHER): Payer: Medicare Other

## 2016-09-24 ENCOUNTER — Observation Stay (HOSPITAL_COMMUNITY): Payer: Medicare Other

## 2016-09-24 DIAGNOSIS — M79662 Pain in left lower leg: Secondary | ICD-10-CM

## 2016-09-24 DIAGNOSIS — R079 Chest pain, unspecified: Secondary | ICD-10-CM | POA: Diagnosis not present

## 2016-09-24 DIAGNOSIS — I10 Essential (primary) hypertension: Secondary | ICD-10-CM | POA: Diagnosis not present

## 2016-09-24 DIAGNOSIS — K219 Gastro-esophageal reflux disease without esophagitis: Secondary | ICD-10-CM | POA: Diagnosis not present

## 2016-09-24 DIAGNOSIS — I110 Hypertensive heart disease with heart failure: Secondary | ICD-10-CM | POA: Diagnosis not present

## 2016-09-24 DIAGNOSIS — E039 Hypothyroidism, unspecified: Secondary | ICD-10-CM | POA: Diagnosis present

## 2016-09-24 DIAGNOSIS — I509 Heart failure, unspecified: Secondary | ICD-10-CM | POA: Diagnosis not present

## 2016-09-24 DIAGNOSIS — I25110 Atherosclerotic heart disease of native coronary artery with unstable angina pectoris: Secondary | ICD-10-CM | POA: Diagnosis not present

## 2016-09-24 LAB — COMPREHENSIVE METABOLIC PANEL
ALBUMIN: 3.4 g/dL — AB (ref 3.5–5.0)
ALT: 15 U/L (ref 14–54)
AST: 25 U/L (ref 15–41)
Alkaline Phosphatase: 50 U/L (ref 38–126)
Anion gap: 7 (ref 5–15)
BUN: 7 mg/dL (ref 6–20)
CHLORIDE: 108 mmol/L (ref 101–111)
CO2: 25 mmol/L (ref 22–32)
Calcium: 8.7 mg/dL — ABNORMAL LOW (ref 8.9–10.3)
Creatinine, Ser: 0.7 mg/dL (ref 0.44–1.00)
GFR calc Af Amer: >60 mL/min (ref >60)
GFR calc non Af Amer: >60 mL/min (ref >60)
GLUCOSE: 111 mg/dL — AB (ref 65–99)
POTASSIUM: 3.6 mmol/L (ref 3.5–5.1)
Sodium: 140 mmol/L (ref 135–145)
Total Bilirubin: 0.3 mg/dL (ref 0.3–1.2)
Total Protein: 6 g/dL — ABNORMAL LOW (ref 6.5–8.1)

## 2016-09-24 LAB — D-DIMER, QUANTITATIVE: D-Dimer, Quant: 0.77 ug{FEU}/mL — ABNORMAL HIGH (ref 0.00–0.50)

## 2016-09-24 LAB — CBC WITH DIFFERENTIAL/PLATELET
BASOS ABS: 0 10*3/uL (ref 0.0–0.1)
BASOS PCT: 0 %
EOS PCT: 1 %
Eosinophils Absolute: 0.1 10*3/uL (ref 0.0–0.7)
HEMATOCRIT: 37.5 % (ref 36.0–46.0)
Hemoglobin: 12.5 g/dL (ref 12.0–15.0)
Lymphocytes Relative: 33 %
Lymphs Abs: 2.6 10*3/uL (ref 0.7–4.0)
MCH: 29.9 pg (ref 26.0–34.0)
MCHC: 33.3 g/dL (ref 30.0–36.0)
MCV: 89.7 fL (ref 78.0–100.0)
MONO ABS: 0.6 10*3/uL (ref 0.1–1.0)
Monocytes Relative: 7 %
NEUTROS ABS: 4.6 10*3/uL (ref 1.7–7.7)
Neutrophils Relative %: 59 %
PLATELETS: 176 10*3/uL (ref 150–400)
RBC: 4.18 MIL/uL (ref 3.87–5.11)
RDW: 12.2 % (ref 11.5–15.5)
WBC: 7.9 10*3/uL (ref 4.0–10.5)

## 2016-09-24 LAB — TROPONIN I
Troponin I: <0.03 ng/mL (ref <0.03)
Troponin I: <0.03 ng/mL
Troponin I: <0.03 ng/mL (ref <0.03)

## 2016-09-24 LAB — LIPID PANEL
Cholesterol: 166 mg/dL (ref 0–200)
HDL: 54 mg/dL
LDL Cholesterol: 103 mg/dL — ABNORMAL HIGH (ref 0–99)
Total CHOL/HDL Ratio: 3.1 ratio
Triglycerides: 46 mg/dL
VLDL: 9 mg/dL (ref 0–40)

## 2016-09-24 LAB — PROTIME-INR
INR: 1.08
Prothrombin Time: 14 s (ref 11.4–15.2)

## 2016-09-24 LAB — TSH: TSH: 1.205 u[IU]/mL (ref 0.350–4.500)

## 2016-09-24 LAB — LIPASE, BLOOD: Lipase: 28 U/L (ref 11–51)

## 2016-09-24 MED ORDER — SODIUM CHLORIDE 0.9 % IV SOLN
INTRAVENOUS | Status: DC
  Administered 2016-09-24 – 2016-09-25 (×3): via INTRAVENOUS

## 2016-09-24 MED ORDER — NITROGLYCERIN 0.4 MG SL SUBL: 0.4000 mg | SUBLINGUAL_TABLET | SUBLINGUAL | Status: DC | PRN

## 2016-09-24 MED ORDER — ZOLPIDEM TARTRATE 5 MG PO TABS: 5.0000 mg | ORAL_TABLET | Freq: Every evening | ORAL | Status: DC | PRN

## 2016-09-24 MED ORDER — ACETAMINOPHEN 325 MG PO TABS
650.0000 mg | ORAL_TABLET | Freq: Once | ORAL | Status: AC
  Administered 2016-09-24: 650 mg via ORAL
  Filled 2016-09-24: qty 2

## 2016-09-24 MED ORDER — MORPHINE SULFATE (PF) 4 MG/ML IV SOLN
2.0000 mg | INTRAVENOUS | Status: DC | PRN
  Administered 2016-09-24 (×2): 2 mg via INTRAVENOUS
  Filled 2016-09-24 (×2): qty 1

## 2016-09-24 MED ORDER — IOPAMIDOL (ISOVUE-370) INJECTION 76%
INTRAVENOUS | Status: AC
  Administered 2016-09-24: 100 mL
  Filled 2016-09-24: qty 100

## 2016-09-24 MED ORDER — ONDANSETRON HCL 4 MG/2ML IJ SOLN: 4.0000 mg | Freq: Three times a day (TID) | INTRAMUSCULAR | Status: DC | PRN

## 2016-09-24 MED ORDER — ALPRAZOLAM 0.25 MG PO TABS
0.2500 mg | ORAL_TABLET | Freq: Two times a day (BID) | ORAL | Status: DC | PRN
  Administered 2016-09-25: 0.25 mg via ORAL
  Filled 2016-09-24: qty 1

## 2016-09-24 MED ORDER — ASPIRIN 325 MG PO TABS: 325.0000 mg | ORAL_TABLET | Freq: Every day | ORAL | 0 refills | Status: DC

## 2016-09-24 MED ORDER — SODIUM CHLORIDE 0.9 % IV BOLUS (SEPSIS)
1000.0000 mL | Freq: Once | INTRAVENOUS | Status: AC
  Administered 2016-09-24: 1000 mL via INTRAVENOUS

## 2016-09-24 MED ORDER — LOSARTAN POTASSIUM 25 MG PO TABS
25.0000 mg | ORAL_TABLET | Freq: Every day | ORAL | Status: DC
  Administered 2016-09-24 – 2016-09-26 (×3): 25 mg via ORAL
  Filled 2016-09-24 (×3): qty 1

## 2016-09-24 MED ORDER — LORATADINE 10 MG PO TABS
10.0000 mg | ORAL_TABLET | Freq: Every day | ORAL | Status: DC
  Administered 2016-09-25 – 2016-09-26 (×2): 10 mg via ORAL
  Filled 2016-09-24 (×3): qty 1

## 2016-09-24 MED ORDER — ASPIRIN 325 MG PO TABS
325.0000 mg | ORAL_TABLET | Freq: Every day | ORAL | Status: DC
  Administered 2016-09-24 – 2016-09-25 (×2): 325 mg via ORAL
  Filled 2016-09-24 (×2): qty 1

## 2016-09-24 MED ORDER — ACETAMINOPHEN 325 MG PO TABS
650.0000 mg | ORAL_TABLET | Freq: Four times a day (QID) | ORAL | Status: DC | PRN
  Administered 2016-09-24 (×2): 650 mg via ORAL
  Filled 2016-09-24 (×2): qty 2

## 2016-09-24 MED ORDER — ENOXAPARIN SODIUM 40 MG/0.4ML ~~LOC~~ SOLN
40.0000 mg | SUBCUTANEOUS | Status: DC
  Administered 2016-09-24 – 2016-09-25 (×2): 40 mg via SUBCUTANEOUS
  Filled 2016-09-24 (×4): qty 0.4

## 2016-09-24 MED ORDER — LEVOTHYROXINE SODIUM 75 MCG PO TABS
75.0000 ug | ORAL_TABLET | Freq: Every day | ORAL | Status: DC
  Administered 2016-09-24 – 2016-09-26 (×3): 75 ug via ORAL
  Filled 2016-09-24 (×3): qty 1

## 2016-09-24 NOTE — Progress Notes (Signed)
PROGRESS NOTE [facility]   Sarah [name]   [id number] DOB: [birth year]  DOA: 09/23/2016 PCP: Woody Seller, MD   Brief Narrative:  Sarah [name] is a 75 y.o. female with medical history significant of hypertension, GERD, obesity, hypothyroidism, currently taking estradiol, who presented to the ED with chest pain. Pain was located in the substernal area, pressure-like, 10 out of 10 in severity, radiating to the left arm. It was associated with mild shortness of breath and dizziness. Pt took 325 mg ASA without help. EMS gave SL NTG and her chest pain improved. She also had nausea and vomited 4 times. Patient placed on obs for the evaluation of chest pain r/o ACS and DVT work up. Cardio work up so far negative TNIs neg x 3, No significant EKG changes. Cardio consulted. PE ruled out, Duppler prelim no DVT found.   Subjective: Patient seen and examined this AM asymptomatic. Denies chest pain, SOB and dizziness. No acute events    Assessment & Plan:   Chest pain: Etiology is not clear. PE ruled out CTA negative, no DVT. Her chest pain is relieved by nitroglycerin, indicating possible coronary artery disease and ACS, vs GERDs - EKG and TNIs neg - 2d echo -Hold estradiol  -Pain control as need -If chest pain return check repeat TNIs and CK  -Cardiology consulted -May need stress test   HTN: stable  -continue Cozaar  Hypothyroidism: - clinically euthyroid  -Continue home Synthroid  Pain of left calf: Negative for DVT, likely to be musculoskeletal pain  DVT prophylaxis: Lovenox Code Status: Full Family Communication: Plan of care discussed with husband and son at bedside. Disposition Plan: Anticipated discharge home after cardiac work up is completed    Consultants:   Cardiology  Procedures:   Duppler LE - No DVT   ECHO pending  Antimicrobials:  None    Objective: Vitals:   09/24/16 0145 09/24/16 0200 09/24/16 0229 09/24/16 0235  BP: 117/57  124/69  (!) 119/58  Pulse: (!) 58 (!) 59  (!) 58  Resp:    20  Temp:    97.8 F (36.6 C)  TempSrc:    Oral  SpO2: 98% 98%  99%  Weight:   94.3 kg (207 lb 12.8 oz)   Height:   5\' 7"  (1.702 m)     Intake/Output Summary (Last 24 hours) at 09/24/16 1030 Last data filed at 09/24/16 0600  Gross per 24 hour  Intake             1950 ml  Output              250 ml  Net             1700 ml   Filed Weights   09/23/16 2336 09/24/16 0229  Weight: 90.7 kg (200 lb) 94.3 kg (207 lb 12.8 oz)    Examination:  General exam: Appears calm and comfortable  HEENT: AC/AT, PERRLA, OP moist and clear Respiratory system: Clear to auscultation. No wheezes,crackle or rhonchi Cardiovascular system: S1 & S2 heard, RRR. No JVD, murmurs, rubs or gallops Gastrointestinal system: Abdomen is nondistended, soft and nontender. No organomegaly or masses felt. NBS. Central nervous system: Alert and oriented. No focal neurological deficits. Extremities: No pedal edema. Symmetric, strength 5/5  + Left calf tenderness  Skin: No rashes, lesions or ulcers Psychiatry: Judgement and insight appear normal. Mood & affect appropriate.    Data Reviewed: I have personally reviewed following labs and imaging studies  CBC:  Recent Labs Lab 09/23/16 2334  WBC 7.9  NEUTROABS 4.6  HGB 12.5  HCT 37.5  MCV 89.7  PLT 0000000   Basic Metabolic Panel:  Recent Labs Lab 09/23/16 2334  NA 140  K 3.6  CL 108  CO2 25  GLUCOSE 111*  BUN 7  CREATININE 0.70  CALCIUM 8.7*   GFR: Estimated Creatinine Clearance: 72.8 mL/min (by C-G formula based on SCr of 0.7 mg/dL). Liver Function Tests:  Recent Labs Lab 09/23/16 2334  AST 25  ALT 15  ALKPHOS 50  BILITOT 0.3  PROT 6.0*  ALBUMIN 3.4*    Recent Labs Lab 09/23/16 2334  LIPASE 28   No results for input(s): AMMONIA in the last 168 hours. Coagulation Profile:  Recent Labs Lab 09/24/16 0155  INR 1.08   Cardiac Enzymes:  Recent Labs Lab 09/24/16 0155  09/24/16 0714  TROPONINI <0.03 <0.03   BNP (last 3 results) No results for input(s): PROBNP in the last 8760 hours. HbA1C: No results for input(s): HGBA1C in the last 72 hours. CBG: No results for input(s): GLUCAP in the last 168 hours. Lipid Profile:  Recent Labs  09/24/16 0317  CHOL 166  HDL 54  LDLCALC 103*  TRIG 46  CHOLHDL 3.1   Thyroid Function Tests:  Recent Labs  09/24/16 0317  TSH 1.205   Anemia Panel: No results for input(s): VITAMINB12, FOLATE, FERRITIN, TIBC, IRON, RETICCTPCT in the last 72 hours. Sepsis Labs:  Recent Labs Lab 09/23/16 2343  LATICACIDVEN 1.09    No results found for this or any previous visit (from the past 240 hour(s)).     Radiology Studies: Dg Chest 2 View  Result Date: 09/24/2016 CLINICAL DATA:  Severe sharp aching centralized chest pain radiating to LEFT arm beginning at 7 p.m. tonight. Associated back pain, vomiting and diaphoresis. History of hypertension or reflux disease. EXAM: CHEST  2 VIEW COMPARISON:  Chest radiograph December 11, 2013 FINDINGS: Cardiomediastinal silhouette is normal. No pleural effusions or focal consolidations. Mild chronic bronchitic changes. Biapical scarring. Trachea projects midline and there is no pneumothorax. Soft tissue planes and included osseous structures are non-suspicious. ACDF. PLIF. IMPRESSION: Mild chronic bronchitic changes without acute cardiopulmonary process. Electronically Signed   By: Elon Alas M.D.   On: 09/24/2016 00:36   Ct Angio Chest Pe W Or Wo Contrast  Result Date: 09/24/2016 CLINICAL DATA:  Chest and LEFT calf pain. Elevated D-dimer. Shortness of breath. History of hypertension. EXAM: CT ANGIOGRAPHY CHEST WITH CONTRAST TECHNIQUE: Multidetector CT imaging of the chest was performed using the standard protocol during bolus administration of intravenous contrast. Multiplanar CT image reconstructions and MIPs were obtained to evaluate the vascular anatomy. CONTRAST:  100 cc  Isovue 370 COMPARISON:  Chest radiograph September 23, 2016 FINDINGS: CARDIOVASCULAR: Adequate contrast opacification of the pulmonary artery's. Main pulmonary artery is not enlarged. No pulmonary arterial filling defects to the level of the subsegmental branches. Heart size is normal, no right heart strain. Mild coronary artery calcifications. No pericardial effusions. Thoracic aorta is normal course and caliber, trace calcific atherosclerosis. MEDIASTINUM/NODES: 11 mm short axis RIGHT hilar lymph node. Sub cm LEFT hilar lymph nodes. LUNGS/PLEURA: Tracheobronchial tree is patent though, demonstrates diffuse bronchial wall thickening, no pneumothorax. Reticular nodular scarring in lung apices bilaterally. Bibasilar dependent atelectasis. Mild bibasilar interlobular septal wall thickening associated with CHF. UPPER ABDOMEN: Small hiatal hernia.  Calcified hepatic granulomas. MUSCULOSKELETAL: Visualized soft tissues and included osseous structures appear normal. ACDF partially imaged. Review of the MIP images confirms the  above findings. IMPRESSION: No acute pulmonary embolism. Bronchial wall thickening can be seen with reactive airway disease or or bronchitis without focal pneumonia. Mild RIGHT hilar lymphadenopathy is likely reactive. Interlobular septal wall thickening associated with CHF. Electronically Signed   By: Elon Alas M.D.   On: 09/24/2016 06:23      Scheduled Meds: . aspirin  325 mg Oral Daily  . enoxaparin (LOVENOX) injection  40 mg Subcutaneous Q24H  . levothyroxine  75 mcg Oral QAC breakfast  . loratadine  10 mg Oral Daily  . losartan  25 mg Oral Q breakfast   Continuous Infusions: . sodium chloride 75 mL/hr at 09/24/16 0600     LOS: 0 days    Time spent: Total of 55 minutes spent with pt, greater than 50% of which was spent in discussion of  treatment, counseling and coordination of care   Chipper Oman, MD [facility] Pager [phone]  If 7PM-7AM, please contact  night-coverage [email or web address] Password [email or web address] 09/24/2016, 10:30 AM

## 2016-09-24 NOTE — ED Notes (Signed)
Patient returned from X-ray 

## 2016-09-24 NOTE — ED Notes (Signed)
Attempted to call report

## 2016-09-24 NOTE — Progress Notes (Signed)
   09/24/16 0235  Vitals  Temp 97.8 F (36.6 C)  Temp Source Oral  BP (!) 119/58  BP Location Right Arm  BP Method Automatic  Patient Position (if appropriate) Lying  Pulse Rate (!) 58  Pulse Rate Source Dinamap  Resp 20  Oxygen Therapy  SpO2 99 %  O2 Device Room Air  Admitted pt to rm 3E19 from ED, pt alert and oriented denied pain at this time, oriented to room, call bell placed within reach.

## 2016-09-24 NOTE — Progress Notes (Signed)
*  PRELIMINARY RESULTS* Vascular Ultrasound Left lower extremity venous duplex has been completed.  Preliminary findings: Bilateral: No evidence of DVT or baker's cyst.   Landry Mellow, RDMS, RVT  09/24/2016, 11:00 AM

## 2016-09-24 NOTE — ED Notes (Signed)
Patient transported to X-ray 

## 2016-09-24 NOTE — ED Notes (Signed)
MD at bedside. 

## 2016-09-24 NOTE — H&P (Signed)
History and Physical    Sarah [name] [id number] DOB: [birth year] DOA: 09/23/2016  Referring MD/NP/PA:   PCP: Woody Seller, MD   Patient coming from:  The patient is coming from home.  At baseline, pt is independent for most of ADL.  Chief Complaint: Chest pain  HPI: Sarah [name] is a 75 y.o. female with medical history significant of hypertension, GERD, obesity, hypothyroidism, currently taking estradiol, who presents with chest pain.  Patient states that her chest pain started suddenly at 7 PM. It is located in the substernal area, pressure-like, 10 out of 10 in severity, radiating to the left arm. It is associated with mild shortness of breath and dizziness. Her chest pain is not pleuritic. It is aggravated or alleviated by any known factors. She did not have long distance traveling recently. She reports that she has left calf pain for several weeks. Pt took 325 mg ASA without help. EMS gave SL NTG and her chest pain improved. Currently her chest pain is 2 out of 10 severity. She had nausea and vomited 4 times without blood in vomitus. He denies abdominal pain diarrhea, symptoms of UTI. No cough, fever or chills. No unilateral weakness.  ED Course: pt was found to have negative troponin, at least 28, lactate 1.09, WBC 7.9, electrolytes renal function okay, temperature normal, bradycardia, oxygen saturation 94% on room air, chest x-ray showed mild bronchitis change. Patient is placed on telemetry bed for observation.  Review of Systems:   General: no fevers, chills, no changes in body weight, has fatigue HEENT: no blurry vision, hearing changes or sore throat Respiratory: has dyspnea, no coughing, wheezing CV: has chest pain, no palpitations GI: has nausea, vomiting, no abdominal pain, diarrhea, constipation GU: no dysuria, burning on urination, increased urinary frequency, hematuria  Ext: has left calf pain Neuro: no unilateral weakness, numbness, or tingling, no vision  change or hearing loss Skin: no rash, no skin tear. MSK: No muscle spasm, no deformity, no limitation of range of movement in spin Heme: No easy bruising.  Travel history: No recent long distant travel.  Allergy:  Allergies  Allergen Reactions  . Adhesive [Tape] Swelling    redness  . Other     Band-Aid cause patient to swell  . Oxycodone-Acetaminophen Nausea And Vomiting  . Penicillins Hives  . Voltaren [Diclofenac Sodium] Rash    hives    Past Medical History:  Diagnosis Date  . Anemia   . Degenerative arthritis   . Headache(784.0)    sinus  . Hypertension   . Hypothyroidism   . Obesity   . Syncope, near 1 year ago    Past Surgical History:  Procedure Laterality Date  . ABDOMINAL HYSTERECTOMY     partial  . arthroscopic knee surgery  July 03, 2013  . CARPAL TUNNEL RELEASE Bilateral   . CATARACT EXTRACTION, BILATERAL    . CERVICAL SPINE SURGERY    . cyst removed from eyebrow  4-5 yrs ago  . LUMBAR SPINE SURGERY    . TONSILLECTOMY  age 30 or 89  . TOTAL KNEE ARTHROPLASTY Right 12/18/2013   Procedure: RIGHT TOTAL KNEE ARTHROPLASTY;  Surgeon: Tobi Bastos, MD;  Location: [facility];  Service: Orthopedics;  Laterality: Right;    Social History:  reports that she has never smoked. She has never used smokeless tobacco. She reports that she does not drink alcohol or use drugs.  Family History:  Family History  Problem Relation Age of Onset  . Alzheimer's disease Sister   .  Cancer - Lung Sister   . Heart disease Brother   . Heart disease Brother   . Heart disease Brother   . Cancer Brother     Bladder cancer     Prior to Admission medications   Medication Sig Start Date End Date Taking? Authorizing Provider  estradiol (ESTRACE) 1 MG tablet Take 0.5 mg by mouth daily with breakfast.  05/07/13  Yes Historical Provider, MD  fexofenadine-pseudoephedrine (ALLEGRA-D 24) 180-240 MG per 24 hr tablet Take 1 tablet by mouth every evening.    Yes Historical Provider, MD    levothyroxine (SYNTHROID, LEVOTHROID) 75 MCG tablet Take 75 mcg by mouth every morning.  04/08/13  Yes Historical Provider, MD  losartan (COZAAR) 50 MG tablet Take 25 mg by mouth daily with breakfast.    Yes Historical Provider, MD  HYDROmorphone (DILAUDID) 2 MG tablet Take 1 tablet (2 mg total) by mouth every 3 (three) hours as needed for severe pain (Q4-6 hours PRN). Patient not taking: Reported on 09/24/2016 12/19/13   Ardeen Jourdain, PA-C  methocarbamol (ROBAXIN) 500 MG tablet Take 1 tablet (500 mg total) by mouth every 6 (six) hours as needed for muscle spasms. Patient not taking: Reported on 09/24/2016 12/19/13   Ardeen Jourdain, PA-C  ondansetron (ZOFRAN) 4 MG tablet Take 1 tablet (4 mg total) by mouth every 6 (six) hours as needed for nausea. Patient not taking: Reported on 09/24/2016 12/20/13   Ardeen Jourdain, PA-C    Physical Exam: Vitals:   09/24/16 0130 09/24/16 0131 09/24/16 0145 09/24/16 0200  BP: (!) 112/54 (!) 112/54 117/57 124/69  Pulse: 62 66 (!) 58 (!) 59  Resp:  14    Temp:      TempSrc:      SpO2: 98% 98% 98% 98%  Weight:      Height:       General: Not in acute distress HEENT:       Eyes: PERRL, EOMI, no scleral icterus.       ENT: No discharge from the ears and nose, no pharynx injection, no tonsillar enlargement.        Neck: No JVD, no bruit, no mass felt. Heme: No neck lymph node enlargement. Cardiac: S1/S2, RRR, No murmurs, No gallops or rubs. Respiratory: No rales, wheezing, rhonchi or rubs. GI: Soft, nondistended, nontender, no rebound pain, no organomegaly, BS present. GU: No hematuria Ext: No pitting leg edema bilaterally. 2+DP/PT pulse bilaterally. Has tenderness in left calf area. Musculoskeletal: No joint deformities, No joint redness or warmth, no limitation of ROM in spin. Skin: No rashes.  Neuro: Alert, oriented X3, cranial nerves II-XII grossly intact, moves all extremities normally.  Psych: Patient is not psychotic, no suicidal or hemocidal  ideation.  Labs on Admission: I have personally reviewed following labs and imaging studies  CBC:  Recent Labs Lab 09/23/16 2334  WBC 7.9  NEUTROABS 4.6  HGB 12.5  HCT 37.5  MCV 89.7  PLT 0000000   Basic Metabolic Panel:  Recent Labs Lab 09/23/16 2334  NA 140  K 3.6  CL 108  CO2 25  GLUCOSE 111*  BUN 7  CREATININE 0.70  CALCIUM 8.7*   GFR: Estimated Creatinine Clearance: 71.3 mL/min (by C-G formula based on SCr of 0.7 mg/dL). Liver Function Tests:  Recent Labs Lab 09/23/16 2334  AST 25  ALT 15  ALKPHOS 50  BILITOT 0.3  PROT 6.0*  ALBUMIN 3.4*    Recent Labs Lab 09/23/16 2334  LIPASE 28   No results for  input(s): AMMONIA in the last 168 hours. Coagulation Profile: No results for input(s): INR, PROTIME in the last 168 hours. Cardiac Enzymes: No results for input(s): CKTOTAL, CKMB, CKMBINDEX, TROPONINI in the last 168 hours. BNP (last 3 results) No results for input(s): PROBNP in the last 8760 hours. HbA1C: No results for input(s): HGBA1C in the last 72 hours. CBG: No results for input(s): GLUCAP in the last 168 hours. Lipid Profile: No results for input(s): CHOL, HDL, LDLCALC, TRIG, CHOLHDL, LDLDIRECT in the last 72 hours. Thyroid Function Tests: No results for input(s): TSH, T4TOTAL, FREET4, T3FREE, THYROIDAB in the last 72 hours. Anemia Panel: No results for input(s): VITAMINB12, FOLATE, FERRITIN, TIBC, IRON, RETICCTPCT in the last 72 hours. Urine analysis:    Component Value Date/Time   COLORURINE YELLOW 12/11/2013 1318   APPEARANCEUR CLEAR 12/11/2013 1318   LABSPEC 1.028 12/11/2013 1318   PHURINE 5.0 12/11/2013 1318   GLUCOSEU NEGATIVE 12/11/2013 1318   HGBUR NEGATIVE 12/11/2013 1318   BILIRUBINUR SMALL (A) 12/11/2013 1318   KETONESUR NEGATIVE 12/11/2013 1318   PROTEINUR NEGATIVE 12/11/2013 1318   UROBILINOGEN 1.0 12/11/2013 1318   NITRITE NEGATIVE 12/11/2013 1318   LEUKOCYTESUR NEGATIVE 12/11/2013 1318   Sepsis  Labs: @LABRCNTIP (procalcitonin:4,lacticidven:4) )No results found for this or any previous visit (from the past 240 hour(s)).   Radiological Exams on Admission: Dg Chest 2 View  Result Date: 09/24/2016 CLINICAL DATA:  Severe sharp aching centralized chest pain radiating to LEFT arm beginning at 7 p.m. tonight. Associated back pain, vomiting and diaphoresis. History of hypertension or reflux disease. EXAM: CHEST  2 VIEW COMPARISON:  Chest radiograph December 11, 2013 FINDINGS: Cardiomediastinal silhouette is normal. No pleural effusions or focal consolidations. Mild chronic bronchitic changes. Biapical scarring. Trachea projects midline and there is no pneumothorax. Soft tissue planes and included osseous structures are non-suspicious. ACDF. PLIF. IMPRESSION: Mild chronic bronchitic changes without acute cardiopulmonary process. Electronically Signed   By: Elon Alas M.D.   On: 09/24/2016 00:36     EKG: Independently reviewed.  [id number], sinus rhythm, low voltage, no ischemic change.    Assessment/Plan Principal Problem:   Chest pain Active Problems:   Obesity   Essential hypertension   Hypothyroidism   Pain of left calf   Chest pain: Etiology is not clear. Potential differential diagnosis includes PE given tenderness in the left calf and currently on estradiol which increases the risk of developing clot. Her chest pain is relieved by nitroglycerin, indicating possible coronary artery disease and ACS  - will place on Tele bed for obs - cycle CE q6 x3 and repeat her EKG in the am  - Nitroglycerin, Morphine, and aspirin - Risk factor stratification: will check FLP and A1C  - 2d echo - Stat D-dimer, if positive, will get CTA to r/o PE - LE doppler to r/o DVT -Hold estradiol   HTN: Blood pressure 112/54 -continue Cozaar  Hypothyroidism: Last TSH was not on record -Continue home Synthroid -Check TSH  Pain of left calf: -LE venous doppler to r/o DVT  DVT ppx: SQ  Lovenox Code Status: Full code Family Communication: None at bed side.  Disposition Plan:  Anticipate discharge back to previous home environment Consults called:  none Admission status: Obs / tele   Date of Service 09/24/2016    Adrieanna Boteler, [facility] Pager [phone]  If 7PM-7AM, please contact night-coverage [email or web address] Password [email or web address] 09/24/2016, 2:09 AM

## 2016-09-24 NOTE — Consult Note (Signed)
Primary cardiologist:N/A Consulting cardiologist: Dr Carlyle Dolly Requesting physician: Dr Patrecia Pour Indication: chest pain   Clinical Summary Sarah [name] is a 75 y.o.female history of HTN, GERD, hypothyroidism admitted with chest pain.  Reports 10/10 sharp chest pain midchest radiating into left chest and left arm, started around 7pm last night while watching tv. Felt nauseous, did have some episodes of vomiting. No significant SOB. Did have some diaphoresis. Symptoms lasted approx 3-4 hours continously. Not better with NG.    Hgb 12.5, Plt 176, K 3.6, Cr 0.7, D-dimer 0.77,  Trop neg x 3 CT PE no PE CXR no acute process  EKG NSR no ischemic changes Echo pending   Allergies  Allergen Reactions  . Adhesive [Tape] Swelling    redness  . Other     Band-Aid cause patient to swell  . Oxycodone-Acetaminophen Nausea And Vomiting  . Penicillins Hives  . Voltaren [Diclofenac Sodium] Rash    hives    Medications Scheduled Medications: . aspirin  325 mg Oral Daily  . enoxaparin (LOVENOX) injection  40 mg Subcutaneous Q24H  . levothyroxine  75 mcg Oral QAC breakfast  . loratadine  10 mg Oral Daily  . losartan  25 mg Oral Q breakfast     Infusions: . sodium chloride 75 mL/hr at 09/24/16 0600     PRN Medications:  acetaminophen, ALPRAZolam, morphine injection, nitroGLYCERIN, ondansetron, zolpidem   Past Medical History:  Diagnosis Date  . Anemia   . Degenerative arthritis   . Headache(784.0)    sinus  . Hypertension   . Hypothyroidism   . Obesity   . Syncope, near 1 year ago    Past Surgical History:  Procedure Laterality Date  . ABDOMINAL HYSTERECTOMY     partial  . arthroscopic knee surgery  July 03, 2013  . CARPAL TUNNEL RELEASE Bilateral   . CATARACT EXTRACTION, BILATERAL    . CERVICAL SPINE SURGERY    . cyst removed from eyebrow  4-5 yrs ago  . LUMBAR SPINE SURGERY    . TONSILLECTOMY  age 73 or 31  . TOTAL KNEE ARTHROPLASTY Right 12/18/2013    Procedure: RIGHT TOTAL KNEE ARTHROPLASTY;  Surgeon: Tobi Bastos, MD;  Location: [facility];  Service: Orthopedics;  Laterality: Right;    Family History  Problem Relation Age of Onset  . Alzheimer's disease Sister   . Cancer - Lung Sister   . Heart disease Brother   . Heart disease Brother   . Heart disease Brother   . Cancer Brother     Bladder cancer    Social History Ms. Dominey reports that she has never smoked. She has never used smokeless tobacco. Ms. Plantz reports that she does not drink alcohol.  Review of Systems CONSTITUTIONAL: No weight loss, fever, chills, weakness or fatigue.  HEENT: Eyes: No visual loss, blurred vision, double vision or yellow sclerae. No hearing loss, sneezing, congestion, runny nose or sore throat.  SKIN: No rash or itching.  CARDIOVASCULAR: per HPI RESPIRATORY: No shortness of breath, cough or sputum.  GASTROINTESTINAL: No anorexia, nausea, vomiting or diarrhea. No abdominal pain or blood.  GENITOURINARY: no polyuria, no dysuria NEUROLOGICAL: No headache, dizziness, syncope, paralysis, ataxia, numbness or tingling in the extremities. No change in bowel or bladder control.  MUSCULOSKELETAL: No muscle, back pain, joint pain or stiffness.  HEMATOLOGIC: No anemia, bleeding or bruising.  LYMPHATICS: No enlarged nodes. No history of splenectomy.  PSYCHIATRIC: No history of depression or anxiety.  Physical Examination Blood pressure (!) 119/58, pulse (!) 58, temperature 97.8 F (36.6 C), temperature source Oral, resp. rate 20, height 5\' 7"  (1.702 m), weight 207 lb 12.8 oz (94.3 kg), SpO2 99 %.  Intake/Output Summary (Last 24 hours) at 09/24/16 1325 Last data filed at 09/24/16 1031  Gross per 24 hour  Intake             1950 ml  Output              950 ml  Net             1000 ml    HEENT: sclera clear, throat clear  Cardiovascular: RRR, no m/r/g, no jvd  Respiratory: CTAB  GI: abdomen soft, NT, ND  MSK: no Le edema  Neuro:  no focal deficits  Psych: appropriate affect   Lab Results  Basic Metabolic Panel:  Recent Labs Lab 09/23/16 2334  NA 140  K 3.6  CL 108  CO2 25  GLUCOSE 111*  BUN 7  CREATININE 0.70  CALCIUM 8.7*    Liver Function Tests:  Recent Labs Lab 09/23/16 2334  AST 25  ALT 15  ALKPHOS 50  BILITOT 0.3  PROT 6.0*  ALBUMIN 3.4*    CBC:  Recent Labs Lab 09/23/16 2334  WBC 7.9  NEUTROABS 4.6  HGB 12.5  HCT 37.5  MCV 89.7  PLT 176    Cardiac Enzymes:  Recent Labs Lab 09/24/16 0155 09/24/16 0714  TROPONINI <0.03 <0.03    BNP: Invalid input(s): POCBNP     Impression/Recommendations 1. Chest pain - unclear etiology. CT PE is negative - EKG and enzymes not consistent with ACS at this time - f/u echo results, would anticipate exericse nuclear stress tomorrow AM. Will make npo at midnight.      Carlyle Dolly, M.D.

## 2016-09-25 ENCOUNTER — Other Ambulatory Visit (HOSPITAL_COMMUNITY): Payer: Medicare Other

## 2016-09-25 ENCOUNTER — Observation Stay (HOSPITAL_COMMUNITY): Payer: Medicare Other

## 2016-09-25 ENCOUNTER — Observation Stay (HOSPITAL_BASED_OUTPATIENT_CLINIC_OR_DEPARTMENT_OTHER): Payer: Medicare Other

## 2016-09-25 DIAGNOSIS — R079 Chest pain, unspecified: Secondary | ICD-10-CM | POA: Diagnosis not present

## 2016-09-25 DIAGNOSIS — M79662 Pain in left lower leg: Secondary | ICD-10-CM | POA: Diagnosis not present

## 2016-09-25 DIAGNOSIS — I25708 Atherosclerosis of coronary artery bypass graft(s), unspecified, with other forms of angina pectoris: Secondary | ICD-10-CM | POA: Diagnosis not present

## 2016-09-25 DIAGNOSIS — E039 Hypothyroidism, unspecified: Secondary | ICD-10-CM

## 2016-09-25 DIAGNOSIS — R9439 Abnormal result of other cardiovascular function study: Secondary | ICD-10-CM

## 2016-09-25 DIAGNOSIS — I10 Essential (primary) hypertension: Secondary | ICD-10-CM | POA: Diagnosis not present

## 2016-09-25 LAB — BASIC METABOLIC PANEL
Anion gap: 5 (ref 5–15)
BUN: 6 mg/dL (ref 6–20)
CHLORIDE: 112 mmol/L — AB (ref 101–111)
CO2: 25 mmol/L (ref 22–32)
Calcium: 8.1 mg/dL — ABNORMAL LOW (ref 8.9–10.3)
Creatinine, Ser: 0.6 mg/dL (ref 0.44–1.00)
GFR calc non Af Amer: >60 mL/min (ref >60)
Glucose, Bld: 102 mg/dL — ABNORMAL HIGH (ref 65–99)
POTASSIUM: 3.6 mmol/L (ref 3.5–5.1)
SODIUM: 142 mmol/L (ref 135–145)

## 2016-09-25 LAB — CBC WITH DIFFERENTIAL/PLATELET
Basophils Absolute: 0 10*3/uL (ref 0.0–0.1)
Basophils Relative: 1 %
Eosinophils Absolute: 0.1 10*3/uL (ref 0.0–0.7)
Eosinophils Relative: 2 %
HCT: 35.5 % — ABNORMAL LOW (ref 36.0–46.0)
HEMOGLOBIN: 11.6 g/dL — AB (ref 12.0–15.0)
LYMPHS ABS: 2 10*3/uL (ref 0.7–4.0)
LYMPHS PCT: 49 %
MCH: 29.6 pg (ref 26.0–34.0)
MCHC: 32.7 g/dL (ref 30.0–36.0)
MCV: 90.6 fL (ref 78.0–100.0)
Monocytes Absolute: 0.5 10*3/uL (ref 0.1–1.0)
Monocytes Relative: 11 %
NEUTROS ABS: 1.5 10*3/uL — AB (ref 1.7–7.7)
NEUTROS PCT: 37 %
Platelets: 166 10*3/uL (ref 150–400)
RBC: 3.92 MIL/uL (ref 3.87–5.11)
RDW: 12.5 % (ref 11.5–15.5)
WBC: 4.2 10*3/uL (ref 4.0–10.5)

## 2016-09-25 LAB — ECHOCARDIOGRAM COMPLETE
HEIGHTINCHES: 67 in
WEIGHTICAEL: 3304 [oz_av]

## 2016-09-25 LAB — HEMOGLOBIN A1C
HEMOGLOBIN A1C: 5.3 % (ref 4.8–5.6)
MEAN PLASMA GLUCOSE: 105 mg/dL

## 2016-09-25 MED ORDER — TECHNETIUM TC 99M TETROFOSMIN IV KIT
30.0000 | PACK | Freq: Once | INTRAVENOUS | Status: AC | PRN
  Administered 2016-09-25: 30 via INTRAVENOUS

## 2016-09-25 MED ORDER — REGADENOSON 0.4 MG/5ML IV SOLN
INTRAVENOUS | Status: AC
  Filled 2016-09-25: qty 5

## 2016-09-25 MED ORDER — TECHNETIUM TC 99M TETROFOSMIN IV KIT
10.0000 | PACK | Freq: Once | INTRAVENOUS | Status: AC | PRN
  Administered 2016-09-25: 10 via INTRAVENOUS

## 2016-09-25 NOTE — Progress Notes (Signed)
The patient is not in the room for echo to be done. The patient is in a stress test.

## 2016-09-25 NOTE — Progress Notes (Signed)
PROGRESS NOTE [facility]   Sarah [name]   [id number] DOB: [birth year]  DOA: 09/23/2016 PCP: Woody Seller, MD   Brief Narrative:  Sarah [name] is a 75 y.o. female with medical history significant of hypertension, GERD, obesity, hypothyroidism, currently taking estradiol, who presented to the ED with chest pain. Pain was located in the substernal area, pressure-like, 10 out of 10 in severity, radiating to the left arm. It was associated with mild shortness of breath and dizziness. Pt took 325 mg ASA without help. EMS gave SL NTG and her chest pain improved. She also had nausea and vomited 4 times. Patient placed on obs for the evaluation of chest pain r/o ACS and DVT work up. Cardio work up so far negative TNIs neg x 3, No significant EKG changes. Cardio consulted. PE ruled out, Duppler prelim no DVT found. ECHO and nuclear stress test pending.   Subjective: No complaints this morning, no acute events. Tele monitor reviewed, no abnormalities noted.   Assessment & Plan:   Chest pain: Etiology is not clear. PE ruled out CTA negative, no DVT. Her chest pain is relieved by nitroglycerin, indicating possible coronary artery disease and ACS, vs GERDs - EKG and TNIs neg - 2d echo pending  -Hold estradiol  -Pain control as need -Cardiology consulted - recommendations appreciated for nuclear stress test today   HTN: stable  -continue Cozaar  Hypothyroidism: - clinically euthyroid  -Continue home Synthroid  Pain of left calf: Negative for DVT, likely to be musculoskeletal pain  DVT prophylaxis: Lovenox Code Status: Full Family Communication: Plan of care discussed with husband and son at bedside. Disposition Plan: Anticipated discharge home after cardiac work up is completed    Consultants:   Cardiology  Procedures:   Duppler LE - No DVT   ECHO pending  Antimicrobials:  None    Objective: Vitals:   09/24/16 2018 09/25/16 0500 09/25/16 1240  09/25/16 1247  BP: (!) 110/50 (!) 124/56 (!) 145/79 (!) 157/84  Pulse: 62 65    Resp: 20     Temp: 98 F (36.7 C) 98 F (36.7 C)    TempSrc: Oral Oral    SpO2: 96% 98%    Weight:  93.7 kg (206 lb 8 oz)    Height:        Intake/Output Summary (Last 24 hours) at 09/25/16 1251 Last data filed at 09/25/16 1100  Gross per 24 hour  Intake             2655 ml  Output             1000 ml  Net             1655 ml   Filed Weights   09/23/16 2336 09/24/16 0229 09/25/16 0500  Weight: 90.7 kg (200 lb) 94.3 kg (207 lb 12.8 oz) 93.7 kg (206 lb 8 oz)    Examination:  General exam: Appears calm and comfortable  Respiratory system: Clear to auscultation. No wheezes,crackle or rhonchi Cardiovascular system: S1 & S2 heard, RRR. No JVD, murmurs, rubs or gallops Gastrointestinal system: Abdomen is nondistended, soft and nontender. Central nervous system: Alert and oriented.  Extremities: No pedal edema, + Left calf tenderness  Skin: No rashes, lesions or ulcers Psychiatry: Judgement and insight appear normal. Mood & affect appropriate.    Data Reviewed: I have personally reviewed following labs and imaging studies  CBC:  Recent Labs Lab 09/23/16 2334 09/25/16 0341  WBC 7.9 4.2  NEUTROABS 4.6 1.5*  HGB 12.5 11.6*  HCT 37.5 35.5*  MCV 89.7 90.6  PLT 176 [id number]   Basic Metabolic Panel:  Recent Labs Lab 09/23/16 2334 09/25/16 0341  NA 140 142  K 3.6 3.6  CL 108 112*  CO2 25 25  GLUCOSE 111* 102*  BUN 7 6  CREATININE 0.70 0.60  CALCIUM 8.7* 8.1*   GFR: Estimated Creatinine Clearance: 72.5 mL/min (by C-G formula based on SCr of 0.6 mg/dL). Liver Function Tests:  Recent Labs Lab 09/23/16 2334  AST 25  ALT 15  ALKPHOS 50  BILITOT 0.3  PROT 6.0*  ALBUMIN 3.4*    Recent Labs Lab 09/23/16 2334  LIPASE 28   No results for input(s): AMMONIA in the last 168 hours. Coagulation Profile:  Recent Labs Lab 09/24/16 0155  INR 1.08   Cardiac Enzymes:  Recent  Labs Lab 09/24/16 0155 09/24/16 0714 09/24/16 1400  TROPONINI <0.03 <0.03 <0.03   BNP (last 3 results) No results for input(s): PROBNP in the last 8760 hours. HbA1C: No results for input(s): HGBA1C in the last 72 hours. CBG: No results for input(s): GLUCAP in the last 168 hours. Lipid Profile:  Recent Labs  09/24/16 0317  CHOL 166  HDL 54  LDLCALC 103*  TRIG 46  CHOLHDL 3.1   Thyroid Function Tests:  Recent Labs  09/24/16 0317  TSH 1.205   Anemia Panel: No results for input(s): VITAMINB12, FOLATE, FERRITIN, TIBC, IRON, RETICCTPCT in the last 72 hours. Sepsis Labs:  Recent Labs Lab 09/23/16 2343  LATICACIDVEN 1.09    No results found for this or any previous visit (from the past 240 hour(s)).     Radiology Studies: Dg Chest 2 View  Result Date: 09/24/2016 CLINICAL DATA:  Severe sharp aching centralized chest pain radiating to LEFT arm beginning at 7 p.m. tonight. Associated back pain, vomiting and diaphoresis. History of hypertension or reflux disease. EXAM: CHEST  2 VIEW COMPARISON:  Chest radiograph December 11, 2013 FINDINGS: Cardiomediastinal silhouette is normal. No pleural effusions or focal consolidations. Mild chronic bronchitic changes. Biapical scarring. Trachea projects midline and there is no pneumothorax. Soft tissue planes and included osseous structures are non-suspicious. ACDF. PLIF. IMPRESSION: Mild chronic bronchitic changes without acute cardiopulmonary process. Electronically Signed   By: Elon Alas M.D.   On: 09/24/2016 00:36   Ct Angio Chest Pe W Or Wo Contrast  Result Date: 09/24/2016 CLINICAL DATA:  Chest and LEFT calf pain. Elevated D-dimer. Shortness of breath. History of hypertension. EXAM: CT ANGIOGRAPHY CHEST WITH CONTRAST TECHNIQUE: Multidetector CT imaging of the chest was performed using the standard protocol during bolus administration of intravenous contrast. Multiplanar CT image reconstructions and MIPs were obtained to  evaluate the vascular anatomy. CONTRAST:  100 cc Isovue 370 COMPARISON:  Chest radiograph September 23, 2016 FINDINGS: CARDIOVASCULAR: Adequate contrast opacification of the pulmonary artery's. Main pulmonary artery is not enlarged. No pulmonary arterial filling defects to the level of the subsegmental branches. Heart size is normal, no right heart strain. Mild coronary artery calcifications. No pericardial effusions. Thoracic aorta is normal course and caliber, trace calcific atherosclerosis. MEDIASTINUM/NODES: 11 mm short axis RIGHT hilar lymph node. Sub cm LEFT hilar lymph nodes. LUNGS/PLEURA: Tracheobronchial tree is patent though, demonstrates diffuse bronchial wall thickening, no pneumothorax. Reticular nodular scarring in lung apices bilaterally. Bibasilar dependent atelectasis. Mild bibasilar interlobular septal wall thickening associated with CHF. UPPER ABDOMEN: Small hiatal hernia.  Calcified hepatic granulomas. MUSCULOSKELETAL: Visualized soft tissues and included osseous structures appear normal. ACDF partially imaged. Review of  the MIP images confirms the above findings. IMPRESSION: No acute pulmonary embolism. Bronchial wall thickening can be seen with reactive airway disease or or bronchitis without focal pneumonia. Mild RIGHT hilar lymphadenopathy is likely reactive. Interlobular septal wall thickening associated with CHF. Electronically Signed   By: Elon Alas M.D.   On: 09/24/2016 06:23      Scheduled Meds: . regadenoson      . aspirin  325 mg Oral Daily  . enoxaparin (LOVENOX) injection  40 mg Subcutaneous Q24H  . levothyroxine  75 mcg Oral QAC breakfast  . loratadine  10 mg Oral Daily  . losartan  25 mg Oral Q breakfast   Continuous Infusions: . sodium chloride 75 mL/hr at 09/24/16 1744     LOS: 0 days    Chipper Oman, MD [facility] Pager [phone]  If 7PM-7AM, please contact night-coverage [email or web address] Password [email or web address] 09/25/2016, 12:51 PM

## 2016-09-25 NOTE — Progress Notes (Signed)
  Echocardiogram 2D Echocardiogram has been performed.  Sarah [name] 09/25/2016, 3:50 PM

## 2016-09-25 NOTE — Progress Notes (Signed)
Stress test ordered for today. Echo still pending. No additional cardiology recs at this time, f/u test results   Zandra Abts MD

## 2016-09-25 NOTE — Progress Notes (Signed)
Patient Name: Sarah [name] Date of Encounter: 09/25/2016  Primary Cardiologist: Dr. Remi Deter Problem List     Principal Problem:   Chest pain Active Problems:   Obesity   Essential hypertension   Hypothyroidism   Pain of left calf     Subjective   No chest pain at rest. She tolerated exercise stress test ok. No exertional CP.   Inpatient Medications    Scheduled Meds: . aspirin  325 mg Oral Daily  . enoxaparin (LOVENOX) injection  40 mg Subcutaneous Q24H  . levothyroxine  75 mcg Oral QAC breakfast  . loratadine  10 mg Oral Daily  . losartan  25 mg Oral Q breakfast   Continuous Infusions: . sodium chloride 75 mL/hr at 09/24/16 1744   PRN Meds: acetaminophen, ALPRAZolam, morphine injection, nitroGLYCERIN, ondansetron, zolpidem   Vital Signs    Vitals:   09/25/16 1247 09/25/16 1251 09/25/16 1253 09/25/16 1255  BP: (!) 157/84 (!) 155/75 (!) 157/70 127/70  Pulse:      Resp:      Temp:      TempSrc:      SpO2:      Weight:      Height:        Intake/Output Summary (Last 24 hours) at 09/25/16 1618 Last data filed at 09/25/16 1336  Gross per 24 hour  Intake             2415 ml  Output             1000 ml  Net             1415 ml   Filed Weights   09/23/16 2336 09/24/16 0229 09/25/16 0500  Weight: 200 lb (90.7 kg) 207 lb 12.8 oz (94.3 kg) 206 lb 8 oz (93.7 kg)    Physical Exam   GEN: Well nourished, well developed, in no acute distress.  HEENT: Grossly normal.  Neck: Supple, no JVD, carotid bruits, or masses. Cardiac: RRR, no murmurs, rubs, or gallops. No clubbing, cyanosis, edema.  Radials/DP/PT 2+ and equal bilaterally.  Respiratory:  Respirations regular and unlabored, clear to auscultation bilaterally. GI: Soft, nontender, nondistended, BS + x 4. MS: no deformity or atrophy. Skin: warm and dry, no rash. Neuro:  Strength and sensation are intact. Psych: AAOx3.  Normal affect.  Labs    CBC  Recent Labs  09/23/16 2334  09/25/16 0341  WBC 7.9 4.2  NEUTROABS 4.6 1.5*  HGB 12.5 11.6*  HCT 37.5 35.5*  MCV 89.7 90.6  PLT 176 [id number]   Basic Metabolic Panel  Recent Labs  09/23/16 2334 09/25/16 0341  NA 140 142  K 3.6 3.6  CL 108 112*  CO2 25 25  GLUCOSE 111* 102*  BUN 7 6  CREATININE 0.70 0.60  CALCIUM 8.7* 8.1*   Liver Function Tests  Recent Labs  09/23/16 2334  AST 25  ALT 15  ALKPHOS 50  BILITOT 0.3  PROT 6.0*  ALBUMIN 3.4*    Recent Labs  09/23/16 2334  LIPASE 28   Cardiac Enzymes  Recent Labs  09/24/16 0155 09/24/16 0714 09/24/16 1400  TROPONINI <0.03 <0.03 <0.03   BNP Invalid input(s): POCBNP D-Dimer  Recent Labs  09/24/16 0155  DDIMER 0.77*   Hemoglobin A1C  Recent Labs  09/24/16 0317  HGBA1C 5.3   Fasting Lipid Panel  Recent Labs  09/24/16 0317  CHOL 166  HDL 54  LDLCALC 103*  TRIG 46  CHOLHDL 3.1  Thyroid Function Tests  Recent Labs  09/24/16 0317  TSH 1.205    Telemetry    NSR- Personally Reviewed  ECG    NSR - Personally Reviewed  Radiology    Dg Chest 2 View  Result Date: 09/24/2016 CLINICAL DATA:  Severe sharp aching centralized chest pain radiating to LEFT arm beginning at 7 p.m. tonight. Associated back pain, vomiting and diaphoresis. History of hypertension or reflux disease. EXAM: CHEST  2 VIEW COMPARISON:  Chest radiograph December 11, 2013 FINDINGS: Cardiomediastinal silhouette is normal. No pleural effusions or focal consolidations. Mild chronic bronchitic changes. Biapical scarring. Trachea projects midline and there is no pneumothorax. Soft tissue planes and included osseous structures are non-suspicious. ACDF. PLIF. IMPRESSION: Mild chronic bronchitic changes without acute cardiopulmonary process. Electronically Signed   By: Elon Alas M.D.   On: 09/24/2016 00:36   Ct Angio Chest Pe W Or Wo Contrast  Result Date: 09/24/2016 CLINICAL DATA:  Chest and LEFT calf pain. Elevated D-dimer. Shortness of breath.  History of hypertension. EXAM: CT ANGIOGRAPHY CHEST WITH CONTRAST TECHNIQUE: Multidetector CT imaging of the chest was performed using the standard protocol during bolus administration of intravenous contrast. Multiplanar CT image reconstructions and MIPs were obtained to evaluate the vascular anatomy. CONTRAST:  100 cc Isovue 370 COMPARISON:  Chest radiograph September 23, 2016 FINDINGS: CARDIOVASCULAR: Adequate contrast opacification of the pulmonary artery's. Main pulmonary artery is not enlarged. No pulmonary arterial filling defects to the level of the subsegmental branches. Heart size is normal, no right heart strain. Mild coronary artery calcifications. No pericardial effusions. Thoracic aorta is normal course and caliber, trace calcific atherosclerosis. MEDIASTINUM/NODES: 11 mm short axis RIGHT hilar lymph node. Sub cm LEFT hilar lymph nodes. LUNGS/PLEURA: Tracheobronchial tree is patent though, demonstrates diffuse bronchial wall thickening, no pneumothorax. Reticular nodular scarring in lung apices bilaterally. Bibasilar dependent atelectasis. Mild bibasilar interlobular septal wall thickening associated with CHF. UPPER ABDOMEN: Small hiatal hernia.  Calcified hepatic granulomas. MUSCULOSKELETAL: Visualized soft tissues and included osseous structures appear normal. ACDF partially imaged. Review of the MIP images confirms the above findings. IMPRESSION: No acute pulmonary embolism. Bronchial wall thickening can be seen with reactive airway disease or or bronchitis without focal pneumonia. Mild RIGHT hilar lymphadenopathy is likely reactive. Interlobular septal wall thickening associated with CHF. Electronically Signed   By: Elon Alas M.D.   On: 09/24/2016 06:23   Nm Myocar Multi W/spect W/wall Motion / Ef  Result Date: 09/25/2016 CLINICAL DATA:  Chest pain EXAM: MYOCARDIAL IMAGING WITH SPECT (REST AND EXERCISE) GATED LEFT VENTRICULAR WALL MOTION STUDY LEFT VENTRICULAR EJECTION FRACTION  TECHNIQUE: Standard myocardial SPECT imaging was performed after resting intravenous injection of 10 mCi Tc-11m tetrofosmin. Subsequently, exercise tolerance test was performed by the patient under the supervision of the Cardiology staff. At peak-stress, 30 mCi Tc-38m tetrofosmin was injected intravenously and standard myocardial SPECT imaging was performed. Quantitative gated imaging was also performed to evaluate left ventricular wall motion, and estimate left ventricular ejection fraction. COMPARISON:  None available FINDINGS: Perfusion: Reversible anteroapical perfusion defect extends laterally compatible with inducible ischemia with exercise stress. No fixed defects. Wall Motion: Normal left ventricular wall motion. No left ventricular dilation. Left Ventricular Ejection Fraction: 69 % End diastolic volume 66 ml End systolic volume 20 ml IMPRESSION: 1. Positive exam for reversible anteroapical ischemia extending laterally. 2. Normal left ventricular wall motion. 3. Left ventricular ejection fraction 69% 4. Non invasive risk stratification*: Intermediate to high *2012 Appropriate Use Criteria for Coronary Revascularization Focused Update: J Am  Coll Cardiol. [id number]. http://content.[email or web address].aspx?articleid=1201161 These results will be called to the ordering clinician or representative by the Radiologist Assistant, and communication documented in the PACS or zVision Dashboard. Electronically Signed   By: Jerilynn Mages.  Shick M.D.   On: 09/25/2016 14:16    Cardiac Studies   NST 09/25/16 FINDINGS: Perfusion: Reversible anteroapical perfusion defect extends laterally compatible with inducible ischemia with exercise stress. No fixed defects.  Wall Motion: Normal left ventricular wall motion. No left ventricular dilation.  Left Ventricular Ejection Fraction: 69 %  End diastolic volume 66 ml  End systolic volume 20 ml  IMPRESSION: 1. Positive exam for reversible anteroapical  ischemia extending laterally.  2. Normal left ventricular wall motion.  3. Left ventricular ejection fraction 69%  4. Non invasive risk stratification*: Intermediate to high  Patient Profile      Ms. Sader is a 75 y.o.female history of HTN, GERD, hypothyroidism admitted with chest pain. Assessment & Plan    1. Chest pain with moderate risk for cardiac etiology: she ruled out for  MI with negative enzymes, however NST abnormal with defect, per above. Intermediate to high risk. She will need definitive LHC in the am. NPO at midnight. 2D echo pending.   Signed, Lyda Jester, PA-C  09/25/2016, 4:18 PM   I have examined the patient and reviewed assessment and plan and discussed with patient.  Agree with above as stated.  Chest pain concerning for cardiac origin.  POsitive stress test with family h/o CAD.  Risks and benefits of cath explained to the patient and she is agreeable to cath.  All questions answered.   [name]

## 2016-09-26 ENCOUNTER — Encounter (HOSPITAL_COMMUNITY)
Admission: EM | Disposition: A | Discharge status: Home / Self Care | Payer: Self-pay | Source: Home / Self Care | Attending: Emergency Medicine

## 2016-09-26 ENCOUNTER — Encounter (HOSPITAL_COMMUNITY): Payer: Self-pay | Admitting: Interventional Cardiology

## 2016-09-26 DIAGNOSIS — I2510 Atherosclerotic heart disease of native coronary artery without angina pectoris: Secondary | ICD-10-CM

## 2016-09-26 DIAGNOSIS — R9439 Abnormal result of other cardiovascular function study: Secondary | ICD-10-CM | POA: Diagnosis not present

## 2016-09-26 DIAGNOSIS — E039 Hypothyroidism, unspecified: Secondary | ICD-10-CM | POA: Diagnosis not present

## 2016-09-26 DIAGNOSIS — I10 Essential (primary) hypertension: Secondary | ICD-10-CM

## 2016-09-26 DIAGNOSIS — I25708 Atherosclerosis of coronary artery bypass graft(s), unspecified, with other forms of angina pectoris: Secondary | ICD-10-CM

## 2016-09-26 DIAGNOSIS — R079 Chest pain, unspecified: Secondary | ICD-10-CM | POA: Diagnosis not present

## 2016-09-26 HISTORY — PX: CARDIAC CATHETERIZATION: SHX172

## 2016-09-26 LAB — CBC WITH DIFFERENTIAL/PLATELET
BASOS ABS: 0 10*3/uL (ref 0.0–0.1)
BASOS PCT: 1 %
EOS ABS: 0.1 10*3/uL (ref 0.0–0.7)
EOS PCT: 2 %
HCT: 37.6 % (ref 36.0–46.0)
Hemoglobin: 12.6 g/dL (ref 12.0–15.0)
Lymphocytes Relative: 50 %
Lymphs Abs: 2.9 10*3/uL (ref 0.7–4.0)
MCH: 30.1 pg (ref 26.0–34.0)
MCHC: 33.5 g/dL (ref 30.0–36.0)
MCV: 90 fL (ref 78.0–100.0)
MONO ABS: 0.6 10*3/uL (ref 0.1–1.0)
Monocytes Relative: 11 %
NEUTROS ABS: 2.1 10*3/uL (ref 1.7–7.7)
Neutrophils Relative %: 36 %
PLATELETS: 171 10*3/uL (ref 150–400)
RBC: 4.18 MIL/uL (ref 3.87–5.11)
RDW: 12.3 % (ref 11.5–15.5)
WBC: 5.9 10*3/uL (ref 4.0–10.5)

## 2016-09-26 LAB — BASIC METABOLIC PANEL
ANION GAP: 5 (ref 5–15)
BUN: 8 mg/dL (ref 6–20)
CALCIUM: 8.6 mg/dL — AB (ref 8.9–10.3)
CO2: 24 mmol/L (ref 22–32)
CREATININE: 0.68 mg/dL (ref 0.44–1.00)
Chloride: 112 mmol/L — ABNORMAL HIGH (ref 101–111)
GFR calc Af Amer: >60 mL/min (ref >60)
GLUCOSE: 94 mg/dL (ref 65–99)
Potassium: 3.9 mmol/L (ref 3.5–5.1)
Sodium: 141 mmol/L (ref 135–145)

## 2016-09-26 LAB — PROTIME-INR
INR: 0.98
PROTHROMBIN TIME: 13 s (ref 11.4–15.2)

## 2016-09-26 SURGERY — LEFT HEART CATH AND CORONARY ANGIOGRAPHY
Anesthesia: LOCAL

## 2016-09-26 MED ORDER — SODIUM CHLORIDE 0.9 % WEIGHT BASED INFUSION: 3.0000 mL/kg/h | INTRAVENOUS | Status: DC

## 2016-09-26 MED ORDER — FENTANYL CITRATE (PF) 100 MCG/2ML IJ SOLN
INTRAMUSCULAR | Status: AC
  Filled 2016-09-26: qty 2

## 2016-09-26 MED ORDER — ASPIRIN EC 81 MG PO TBEC
81.0000 mg | DELAYED_RELEASE_TABLET | Freq: Every day | ORAL | 0 refills | Status: DC
Start: 2016-09-26 — End: 2019-10-29

## 2016-09-26 MED ORDER — SODIUM CHLORIDE 0.9% FLUSH
3.0000 mL | Freq: Two times a day (BID) | INTRAVENOUS | Status: DC
  Administered 2016-09-26: 3 mL via INTRAVENOUS

## 2016-09-26 MED ORDER — SODIUM CHLORIDE 0.9% FLUSH: 3.0000 mL | INTRAVENOUS | Status: DC | PRN

## 2016-09-26 MED ORDER — FENTANYL CITRATE (PF) 100 MCG/2ML IJ SOLN
INTRAMUSCULAR | Status: DC | PRN
  Administered 2016-09-26: 25 ug via INTRAVENOUS

## 2016-09-26 MED ORDER — NITROGLYCERIN 0.4 MG SL SUBL: 0.4000 mg | SUBLINGUAL_TABLET | SUBLINGUAL | 1 refills | Status: DC | PRN

## 2016-09-26 MED ORDER — SODIUM CHLORIDE 0.9% FLUSH: 3.0000 mL | Freq: Two times a day (BID) | INTRAVENOUS | Status: DC

## 2016-09-26 MED ORDER — SODIUM CHLORIDE 0.9 % WEIGHT BASED INFUSION: 1.0000 mL/kg/h | INTRAVENOUS | Status: AC

## 2016-09-26 MED ORDER — MIDAZOLAM HCL 2 MG/2ML IJ SOLN
INTRAMUSCULAR | Status: AC
  Filled 2016-09-26: qty 2

## 2016-09-26 MED ORDER — SODIUM CHLORIDE 0.9 % IV SOLN: 250.0000 mL | INTRAVENOUS | Status: DC | PRN

## 2016-09-26 MED ORDER — VERAPAMIL HCL 2.5 MG/ML IV SOLN
INTRAVENOUS | Status: AC
  Filled 2016-09-26: qty 2

## 2016-09-26 MED ORDER — ONDANSETRON HCL 4 MG/2ML IJ SOLN: 4.0000 mg | Freq: Four times a day (QID) | INTRAMUSCULAR | Status: DC | PRN

## 2016-09-26 MED ORDER — VERAPAMIL HCL 2.5 MG/ML IV SOLN
INTRAVENOUS | Status: DC | PRN
  Administered 2016-09-26: 12:00:00 via INTRA_ARTERIAL

## 2016-09-26 MED ORDER — LIDOCAINE HCL (PF) 1 % IJ SOLN
INTRAMUSCULAR | Status: DC | PRN
  Administered 2016-09-26: 2 mL via INTRADERMAL

## 2016-09-26 MED ORDER — ATORVASTATIN CALCIUM 20 MG PO TABS: 20.0000 mg | ORAL_TABLET | Freq: Every day | ORAL | 0 refills | Status: DC

## 2016-09-26 MED ORDER — HEPARIN (PORCINE) IN NACL 2-0.9 UNIT/ML-% IJ SOLN
INTRAMUSCULAR | Status: DC | PRN
Start: 2016-09-26 — End: 2016-09-26
  Administered 2016-09-26: 1000 mL via INTRA_ARTERIAL

## 2016-09-26 MED ORDER — IOPAMIDOL (ISOVUE-370) INJECTION 76%
INTRAVENOUS | Status: AC
  Filled 2016-09-26: qty 100

## 2016-09-26 MED ORDER — ATORVASTATIN CALCIUM 10 MG PO TABS: 10.0000 mg | ORAL_TABLET | Freq: Every day | ORAL | Status: DC

## 2016-09-26 MED ORDER — MIDAZOLAM HCL 2 MG/2ML IJ SOLN
INTRAMUSCULAR | Status: DC | PRN
  Administered 2016-09-26: 2 mg via INTRAVENOUS

## 2016-09-26 MED ORDER — NITROGLYCERIN 1 MG/10 ML FOR IR/CATH LAB
INTRA_ARTERIAL | Status: AC
  Filled 2016-09-26: qty 10

## 2016-09-26 MED ORDER — SODIUM CHLORIDE 0.9 % WEIGHT BASED INFUSION: 1.0000 mL/kg/h | INTRAVENOUS | Status: DC

## 2016-09-26 MED ORDER — HEPARIN SODIUM (PORCINE) 1000 UNIT/ML IJ SOLN
INTRAMUSCULAR | Status: DC | PRN
  Administered 2016-09-26: 5000 [IU] via INTRAVENOUS

## 2016-09-26 MED ORDER — SODIUM CHLORIDE 0.9 % WEIGHT BASED INFUSION
3.0000 mL/kg/h | INTRAVENOUS | Status: DC
  Administered 2016-09-26: 3 mL/kg/h via INTRAVENOUS

## 2016-09-26 MED ORDER — LIDOCAINE HCL (PF) 1 % IJ SOLN
INTRAMUSCULAR | Status: AC
  Filled 2016-09-26: qty 30

## 2016-09-26 MED ORDER — ACETAMINOPHEN 325 MG PO TABS: 650.0000 mg | ORAL_TABLET | ORAL | Status: DC | PRN

## 2016-09-26 MED ORDER — IOPAMIDOL (ISOVUE-370) INJECTION 76%
INTRAVENOUS | Status: DC | PRN
  Administered 2016-09-26: 65 mL via INTRA_ARTERIAL

## 2016-09-26 MED ORDER — HEPARIN (PORCINE) IN NACL 2-0.9 UNIT/ML-% IJ SOLN
INTRAMUSCULAR | Status: AC
  Filled 2016-09-26: qty 1000

## 2016-09-26 MED ORDER — ASPIRIN 81 MG PO CHEW
81.0000 mg | CHEWABLE_TABLET | ORAL | Status: AC
  Administered 2016-09-26: 81 mg via ORAL
  Filled 2016-09-26: qty 1

## 2016-09-26 MED ORDER — ASPIRIN 325 MG PO TABS: 325.0000 mg | ORAL_TABLET | Freq: Every day | ORAL | Status: DC

## 2016-09-26 SURGICAL SUPPLY — 11 items
CATH INFINITI 5 FR JL3.5 (CATHETERS) ×2 IMPLANT
CATH INFINITI JR4 5F (CATHETERS) ×2 IMPLANT
DEVICE RAD COMP TR BAND LRG (VASCULAR PRODUCTS) ×2 IMPLANT
GLIDESHEATH SLEND SS 6F .021 (SHEATH) ×2 IMPLANT
KIT HEART LEFT (KITS) ×2 IMPLANT
PACK CARDIAC CATHETERIZATION (CUSTOM PROCEDURE TRAY) ×2 IMPLANT
SYR MEDRAD MARK V 150ML (SYRINGE) ×2 IMPLANT
TRANSDUCER W/STOPCOCK (MISCELLANEOUS) ×2 IMPLANT
TUBING CIL FLEX 10 FLL-RA (TUBING) ×2 IMPLANT
WIRE HI TORQ VERSACORE-J 145CM (WIRE) ×2 IMPLANT
WIRE SAFE-T 1.5MM-J .035X260CM (WIRE) ×2 IMPLANT

## 2016-09-26 NOTE — Progress Notes (Signed)
PROGRESS NOTE [facility]   Sarah [name]   [id number] DOB: Sep 11, 1941  DOA: 09/23/2016 PCP: Woody Seller, MD   Brief Narrative:  Sarah [name] is a 75 y.o. female with medical history significant of hypertension, GERD, obesity, hypothyroidism, currently taking estradiol, who presented to the ED with chest pain. Pain was located in the substernal area, pressure-like, 10 out of 10 in severity, radiating to the left arm. It was associated with mild shortness of breath and dizziness. Pt took 325 mg ASA without help. EMS gave SL NTG and her chest pain improved. She also had nausea and vomited 4 times. Patient placed on obs for the evaluation of chest pain r/o ACS and DVT work up. Cardio work up so far negative TNIs neg x 3, No significant EKG changes. Cardio consulted. PE ruled out, Duppler prelim no DVT found. Nuclear stress test showed abnormal with defect, per above. Intermediate to high risk. Patient for cardiac cath today.   Subjective: Patient seen and examined at bedside. Had 1 episode of chest pain early this AM radiating to the arm, which resolved on its own. At the time of my exam patient was asymptomatic. No other complaints.   Assessment & Plan:   Chest pain: Etiology is not clear. NST abnormal with defect, per above. Intermediate to high risk. PE ruled out CTA negative, no DVT. Her chest pain is relieved by nitroglycerin, indicating possible coronary artery disease and ACS, vs GERDs - EKG and TNIs neg - 2d echo - LV EF [id number], grade 2 diastolic dysfunction  -Hold estradiol  -Pain control as need -Cardiology consulted - recommendations appreciated for cardiac cath today   HTN: stable  -continue Cozaar  Hypothyroidism: - clinically euthyroid  -Continue home Synthroid  Pain of left calf: Negative for DVT, likely to be musculoskeletal pain - resolved   DVT prophylaxis: Lovenox Code Status: Full Family Communication: Plan of care discussed with husband  and son at bedside. Disposition Plan: Anticipated discharge home after cardiac work up is completed    Consultants:   Cardiology  Procedures:   Duppler LE - No DVT   ECHO pending  Antimicrobials:  None    Objective: Vitals:   09/25/16 1255 09/25/16 1942 09/26/16 0436 09/26/16 0825  BP: 127/70 139/70 125/61 130/68  Pulse:  71 73 64  Resp:  18 18   Temp:  98.5 F (36.9 C) 98.1 F (36.7 C)   TempSrc:  Oral Oral   SpO2:  98% 98%   Weight:   93.1 kg (205 lb 4.8 oz)   Height:        Intake/Output Summary (Last 24 hours) at 09/26/16 1111 Last data filed at 09/26/16 0648  Gross per 24 hour  Intake          1436.25 ml  Output             1700 ml  Net          -263.75 ml   Filed Weights   09/24/16 0229 09/25/16 0500 09/26/16 0436  Weight: 94.3 kg (207 lb 12.8 oz) 93.7 kg (206 lb 8 oz) 93.1 kg (205 lb 4.8 oz)    Examination:  General exam: Appears calm and comfortable  Respiratory system: Clear to auscultation. No wheezes,crackle or rhonchi Cardiovascular system: S1 & S2 heard, RRR. No JVD, murmurs, rubs or gallops Gastrointestinal system: Abdomen is nondistended, soft and nontender. Central nervous system: Alert and oriented.  Extremities: No pedal edema, no tenderness  Skin: No rashes, lesions  or ulcers Psychiatry: Judgement and insight appear normal. Mood & affect appropriate.    Data Reviewed: I have personally reviewed following labs and imaging studies  CBC:  Recent Labs Lab 09/23/16 2334 09/25/16 0341 09/26/16 0444  WBC 7.9 4.2 5.9  NEUTROABS 4.6 1.5* 2.1  HGB 12.5 11.6* 12.6  HCT 37.5 35.5* 37.6  MCV 89.7 90.6 90.0  PLT 176 166 [id number]   Basic Metabolic Panel:  Recent Labs Lab 09/23/16 2334 09/25/16 0341 09/26/16 0444  NA 140 142 141  K 3.6 3.6 3.9  CL 108 112* 112*  CO2 25 25 24   GLUCOSE 111* 102* 94  BUN 7 6 8   CREATININE 0.70 0.60 0.68  CALCIUM 8.7* 8.1* 8.6*   GFR: Estimated Creatinine Clearance: 72.3 mL/min (by C-G formula based  on SCr of 0.68 mg/dL). Liver Function Tests:  Recent Labs Lab 09/23/16 2334  AST 25  ALT 15  ALKPHOS 50  BILITOT 0.3  PROT 6.0*  ALBUMIN 3.4*    Recent Labs Lab 09/23/16 2334  LIPASE 28   No results for input(s): AMMONIA in the last 168 hours. Coagulation Profile:  Recent Labs Lab 09/24/16 0155 09/26/16 0444  INR 1.08 0.98   Cardiac Enzymes:  Recent Labs Lab 09/24/16 0155 09/24/16 0714 09/24/16 1400  TROPONINI <0.03 <0.03 <0.03   BNP (last 3 results) No results for input(s): PROBNP in the last 8760 hours. HbA1C:  Recent Labs  09/24/16 0317  HGBA1C 5.3   CBG: No results for input(s): GLUCAP in the last 168 hours. Lipid Profile:  Recent Labs  09/24/16 0317  CHOL 166  HDL 54  LDLCALC 103*  TRIG 46  CHOLHDL 3.1   Thyroid Function Tests:  Recent Labs  09/24/16 0317  TSH 1.205   Anemia Panel: No results for input(s): VITAMINB12, FOLATE, FERRITIN, TIBC, IRON, RETICCTPCT in the last 72 hours. Sepsis Labs:  Recent Labs Lab 09/23/16 2343  LATICACIDVEN 1.09    No results found for this or any previous visit (from the past 240 hour(s)).     Radiology Studies: Nm Myocar Multi W/spect W/wall Motion / Ef  Result Date: 09/25/2016 CLINICAL DATA:  Chest pain EXAM: MYOCARDIAL IMAGING WITH SPECT (REST AND EXERCISE) GATED LEFT VENTRICULAR WALL MOTION STUDY LEFT VENTRICULAR EJECTION FRACTION TECHNIQUE: Standard myocardial SPECT imaging was performed after resting intravenous injection of 10 mCi Tc-7m tetrofosmin. Subsequently, exercise tolerance test was performed by the patient under the supervision of the Cardiology staff. At peak-stress, 30 mCi Tc-57m tetrofosmin was injected intravenously and standard myocardial SPECT imaging was performed. Quantitative gated imaging was also performed to evaluate left ventricular wall motion, and estimate left ventricular ejection fraction. COMPARISON:  None available FINDINGS: Perfusion: Reversible anteroapical  perfusion defect extends laterally compatible with inducible ischemia with exercise stress. No fixed defects. Wall Motion: Normal left ventricular wall motion. No left ventricular dilation. Left Ventricular Ejection Fraction: 69 % End diastolic volume 66 ml End systolic volume 20 ml IMPRESSION: 1. Positive exam for reversible anteroapical ischemia extending laterally. 2. Normal left ventricular wall motion. 3. Left ventricular ejection fraction 69% 4. Non invasive risk stratification*: Intermediate to high *2012 Appropriate Use Criteria for Coronary Revascularization Focused Update: J Am Coll Cardiol. [id number]. http://[email or web address].aspx?articleid=[email or web address] These results will be called to the ordering clinician or representative by the Radiologist Assistant, and communication documented in the PACS or zVision Dashboard. Electronically Signed   By: Jerilynn Mages.  Shick M.D.   On: 09/25/2016 14:16      Scheduled Meds: . [START ON 09/27/2016] aspirin  325 mg Oral Daily  . enoxaparin (LOVENOX) injection  40 mg Subcutaneous Q24H  . levothyroxine  75 mcg Oral QAC breakfast  . loratadine  10 mg Oral Daily  . losartan  25 mg Oral Q breakfast  . sodium chloride flush  3 mL Intravenous Q12H   Continuous Infusions: . sodium chloride 75 mL/hr at 09/25/16 2345  . sodium chloride 1 mL/kg/hr (09/26/16 0706)     LOS: 0 days    Chipper Oman, MD [facility] Pager [phone]  If 7PM-7AM, please contact night-coverage [email or web address] Password [email or web address] 09/26/2016, 11:11 AM

## 2016-09-26 NOTE — Interval H&P Note (Signed)
Cath Lab Visit (complete for each Cath Lab visit)  Clinical Evaluation Leading to the Procedure:   ACS: No.  Non-ACS:    Anginal Classification: CCS III  Anti-ischemic medical therapy: Minimal Therapy (1 class of medications)  Non-Invasive Test Results: High-risk stress test findings: cardiac mortality >3%/year  Prior CABG: No previous CABG   Anterior defect   History and Physical Interval Note:  09/26/2016 11:29 AM  Sarah [name]  has presented today for surgery, with the diagnosis of unstable angina  The various methods of treatment have been discussed with the patient and family. After consideration of risks, benefits and other options for treatment, the patient has consented to  Procedure(s): Left Heart Cath and Coronary Angiography (N/A) as a surgical intervention .  The patient's history has been reviewed, patient examined, no change in status, stable for surgery.  I have reviewed the patient's chart and labs.  Questions were answered to the patient's satisfaction.     Sarah [name]

## 2016-09-26 NOTE — Progress Notes (Signed)
Patient Name: Sarah [name] Date of Encounter: 09/26/2016  Primary Cardiologist: [facility]- Dr[facility] Problem List     Principal Problem:   Chest pain Active Problems:   Obesity   Essential hypertension   Hypothyroidism   Pain of left calf   Abnormal nuclear stress test     Subjective   Waiting for heart cath, No CP  Inpatient Medications    Scheduled Meds: . [START ON 09/27/2016] aspirin  325 mg Oral Daily  . enoxaparin (LOVENOX) injection  40 mg Subcutaneous Q24H  . levothyroxine  75 mcg Oral QAC breakfast  . loratadine  10 mg Oral Daily  . losartan  25 mg Oral Q breakfast  . sodium chloride flush  3 mL Intravenous Q12H   Continuous Infusions: . sodium chloride 75 mL/hr at 09/25/16 2345  . sodium chloride 1 mL/kg/hr (09/26/16 0706)   PRN Meds: sodium chloride, acetaminophen, ALPRAZolam, morphine injection, nitroGLYCERIN, ondansetron, sodium chloride flush, zolpidem   Vital Signs    Vitals:   09/25/16 1255 09/25/16 1942 09/26/16 0436 09/26/16 0825  BP: 127/70 139/70 125/61 130/68  Pulse:  71 73 64  Resp:  18 18   Temp:  98.5 F (36.9 C) 98.1 F (36.7 C)   TempSrc:  Oral Oral   SpO2:  98% 98%   Weight:   205 lb 4.8 oz (93.1 kg)   Height:        Intake/Output Summary (Last 24 hours) at 09/26/16 0956 Last data filed at 09/26/16 0648  Gross per 24 hour  Intake          1811.25 ml  Output             2200 ml  Net          -388.75 ml   Filed Weights   09/24/16 0229 09/25/16 0500 09/26/16 0436  Weight: 207 lb 12.8 oz (94.3 kg) 206 lb 8 oz (93.7 kg) 205 lb 4.8 oz (93.1 kg)    Physical Exam   GEN: Well nourished, well developed, in no acute distress.  HEENT: Grossly normal.  Neck: Supple, no JVD, carotid bruits, or masses. Cardiac: RRR, no murmurs, rubs, or gallops. No clubbing, cyanosis, edema.  Radials/DP/PT 2+ and equal bilaterally.  Respiratory:  Respirations regular and unlabored, clear to auscultation bilaterally. GI: Soft,  nontender, nondistended, BS + x 4. MS: no deformity or atrophy. Skin: warm and dry, no rash. Neuro:  Strength and sensation are intact. Psych: AAOx3.  Normal affect.  Labs    CBC  Recent Labs  09/25/16 0341 09/26/16 0444  WBC 4.2 5.9  NEUTROABS 1.5* 2.1  HGB 11.6* 12.6  HCT 35.5* 37.6  MCV 90.6 90.0  PLT 166 [id number]   Basic Metabolic Panel  Recent Labs  09/25/16 0341 09/26/16 0444  NA 142 141  K 3.6 3.9  CL 112* 112*  CO2 25 24  GLUCOSE 102* 94  BUN 6 8  CREATININE 0.60 0.68  CALCIUM 8.1* 8.6*   Liver Function Tests  Recent Labs  09/23/16 2334  AST 25  ALT 15  ALKPHOS 50  BILITOT 0.3  PROT 6.0*  ALBUMIN 3.4*    Recent Labs  09/23/16 2334  LIPASE 28   Cardiac Enzymes  Recent Labs  09/24/16 0155 09/24/16 0714 09/24/16 1400  TROPONINI <0.03 <0.03 <0.03   BNP Invalid input(s): POCBNP D-Dimer  Recent Labs  09/24/16 0155  DDIMER 0.77*   Hemoglobin A1C  Recent Labs  09/24/16 0317  HGBA1C 5.3  Fasting Lipid Panel  Recent Labs  09/24/16 0317  CHOL 166  HDL 54  LDLCALC 103*  TRIG 46  CHOLHDL 3.1   Thyroid Function Tests  Recent Labs  09/24/16 0317  TSH 1.205    Telemetry    NSR - Personally Reviewed  ECG    NSR - Personally Reviewed  Radiology    Nm Myocar Multi W/spect W/wall Motion / Ef  Result Date: 09/25/2016 CLINICAL DATA:  Chest pain EXAM: MYOCARDIAL IMAGING WITH SPECT (REST AND EXERCISE) GATED LEFT VENTRICULAR WALL MOTION STUDY LEFT VENTRICULAR EJECTION FRACTION TECHNIQUE: Standard myocardial SPECT imaging was performed after resting intravenous injection of 10 mCi Tc-52m tetrofosmin. Subsequently, exercise tolerance test was performed by the patient under the supervision of the Cardiology staff. At peak-stress, 30 mCi Tc-9m tetrofosmin was injected intravenously and standard myocardial SPECT imaging was performed. Quantitative gated imaging was also performed to evaluate left ventricular wall motion, and  estimate left ventricular ejection fraction. COMPARISON:  None available FINDINGS: Perfusion: Reversible anteroapical perfusion defect extends laterally compatible with inducible ischemia with exercise stress. No fixed defects. Wall Motion: Normal left ventricular wall motion. No left ventricular dilation. Left Ventricular Ejection Fraction: 69 % End diastolic volume 66 ml End systolic volume 20 ml IMPRESSION: 1. Positive exam for reversible anteroapical ischemia extending laterally. 2. Normal left ventricular wall motion. 3. Left ventricular ejection fraction 69% 4. Non invasive risk stratification*: Intermediate to high *2012 Appropriate Use Criteria for Coronary Revascularization Focused Update: J Am Coll Cardiol. [id number]. http://content.[email or web address].aspx?articleid=1201161 These results will be called to the ordering clinician or representative by the Radiologist Assistant, and communication documented in the PACS or zVision Dashboard. Electronically Signed   By: Jerilynn Mages.  Shick M.D.   On: 09/25/2016 14:16    Cardiac Studies    NST 09/25/16 FINDINGS: Perfusion: Reversible anteroapical perfusion defect extends laterally compatible with inducible ischemia with exercise stress. No fixed defects.  Wall Motion: Normal left ventricular wall motion. No left ventricular dilation.  Left Ventricular Ejection Fraction: 69 %  End diastolic volume 66 ml  End systolic volume 20 ml IMPRESSION: 1. Positive exam for reversible anteroapical ischemia extending laterally. 2. Normal left ventricular wall motion. 3. Left ventricular ejection fraction 69% 4. Non invasive risk stratification*: Intermediate to high  Patient Profile     Sarah [name] a 75 y.o.femalehistory of HTN, GERD, hypothyroidism admitted with chest pain.  Assessment & Plan    1. Chest pain with moderate risk for cardiac etiology: she ruled out for  MI with negative enzymes, however NST abnormal with defect, per  above. Intermediate to high risk. She will need definitive LHC in the am. NPO at midnight. 2D echo pending. Cath today at   Signed, Angelena Form, PA-C  09/26/2016, 9:56 AM   I have examined the patient and reviewed assessment and plan and discussed with patient.  Agree with above as stated.  Cath done.  See report.  Medical therapy with SL NTG.  Aggressive lipid management with low dose statin. Start atorvastatin 10 mg daily.  Recheck liver and lipids in 2-3 months. Plan discharge later today.  [name]

## 2016-09-26 NOTE — Discharge Summary (Signed)
Physician Discharge Summary  Sarah [name]  [id number]  DOB: [birth year]  DOA: 09/23/2016 PCP: Woody Seller, MD  Admit date: 09/23/2016 Discharge date: 09/26/2016  Admitted From: Home Disposition: Home  Recommendations for Outpatient Follow-up:  1. Follow up with PCP in 1-2 weeks 2. Please obtain BMP/CBC in one week 3. Follow up with Cardiology in 2-3 weeks   Home Health: None  Equipment/Devices: None   Discharge Condition: Stable  CODE STATUS: Full  Diet recommendation: Heart Healthy /  Brief/Interim Summary: Sarah Eisenhauer Everettis a 75 y.o.femalewith medical history significant of hypertension, GERD, obesity, hypothyroidism, currently taking estradiol, who presented to the ED with chest pain. Pain was located in the substernal area, pressure-like, 10 out of 10 in severity, radiating to the left arm. It was associated with mild shortness of breath and dizziness. Pt took 325 mg ASA without help. EMS gave SL NTG and her chest pain improved. She also had nausea and vomited 4 times. Patient placed on obs for the evaluation of chest pain r/o ACS and DVT work up. Cardio work up so far negative TNIs neg x 3, No significant EKG changes. Cardio consulted. PE ruled out, Duppler prelim no DVT found. Nuclear stress test showed abnormal with defect, per above. Intermediate to high risk. Cardiac cath show multivessel blockage but no stent where needed. Patient will be discharge with aggressive preventive therapy.   Discharge Diagnoses:   Chest pain: 2/2 CAD, stable Angina, NST abnormal with defect, per above. Intermediate to high risk. Cath multivessel diseases. PE ruled out CTA negative, no DVT. Her chest pain is relieved by nitroglycerin. No ACS  EKG and TNIs neg -SL NGT PRN pain  -Aggressive preventive medical therapy, patient on ARB, ASA and Lipitor  -Will increase Lipitor to 20 mg OD - titrate as tolerated  -Consider start low dose BB - outpatient  -Continue ASA 81 mg  -Continue  Losartan  -If discomfort continues can consider Imdur  -Follow with cardiology    Grade 2 diastolic dysfunction - - 2d echo - LV EF 60-65%  -See above -Follow up with PCP   Sarah [name]  -continue Cozaar  Hypothyroidism: - clinically euthyroid  -Continue home Synthroid  Pain of left calf: Negative for DVT, likely to be musculoskeletal pain - resolved    Discharge Instructions  Discharge Instructions    Call MD for:  difficulty breathing, headache or visual disturbances    Complete by:  As directed    Call MD for:  extreme fatigue    Complete by:  As directed    Call MD for:  hives    Complete by:  As directed    Call MD for:  persistant dizziness or light-headedness    Complete by:  As directed    Call MD for:  persistant nausea and vomiting    Complete by:  As directed    Call MD for:  redness, tenderness, or signs of infection (pain, swelling, redness, odor or green/yellow discharge around incision site)    Complete by:  As directed    Call MD for:  severe uncontrolled pain    Complete by:  As directed    Call MD for:  temperature >100.4    Complete by:  As directed    Diet - low sodium heart healthy    Complete by:  As directed    Discharge instructions    Complete by:  As directed    Discharge instructions    Complete by:  As directed  Do not put any pressure or use arm of cath Call MD or go to the ER if arm begins to bleed.   Increase activity slowly    Complete by:  As directed        Medication List    STOP taking these medications   HYDROmorphone 2 MG tablet Commonly known as:  DILAUDID   methocarbamol 500 MG tablet Commonly known as:  ROBAXIN   ondansetron 4 MG tablet Commonly known as:  ZOFRAN     TAKE these medications   aspirin EC 81 MG tablet Take 1 tablet (81 mg total) by mouth daily.   atorvastatin 20 MG tablet Commonly known as:  LIPITOR Take 1 tablet (20 mg total) by mouth daily at 6 PM.   estradiol 1 MG tablet Commonly known  as:  ESTRACE Take 0.5 mg by mouth daily with breakfast.   fexofenadine-pseudoephedrine 180-240 MG 24 hr tablet Commonly known as:  ALLEGRA-D 24 Take 1 tablet by mouth every evening.   levothyroxine 75 MCG tablet Commonly known as:  SYNTHROID, LEVOTHROID Take 75 mcg by mouth every morning.   losartan 50 MG tablet Commonly known as:  COZAAR Take 25 mg by mouth daily with breakfast.   nitroGLYCERIN 0.4 MG SL tablet Commonly known as:  NITROSTAT Place 1 tablet (0.4 mg total) under the tongue every 5 (five) minutes as needed for chest pain.      Follow-up Information    [name], MD .   Specialties:  Cardiology, Radiology, Interventional Cardiology Why:  The office will call you to make an appointment in 2-3 weeks Contact information: [id number] [place] [address] [phone]        Woody Seller, MD. Schedule an appointment as soon as possible for a visit in 1 week(s).   Specialty:  Family Medicine Contact information: [address] [place] [address] [phone]          Allergies  Allergen Reactions  . Adhesive [Tape] Swelling    redness  . Other     Band-Aid cause patient to swell  . Oxycodone-Acetaminophen Nausea And Vomiting  . Penicillins Hives  . Voltaren [Diclofenac Sodium] Rash    hives    Consultations:  Cardiology   Procedures/Studies: Dg Chest 2 View  Result Date: 09/24/2016 CLINICAL DATA:  Severe sharp aching centralized chest pain radiating to LEFT arm beginning at 7 p.m. tonight. Associated back pain, vomiting and diaphoresis. History of hypertension or reflux disease. EXAM: CHEST  2 VIEW COMPARISON:  Chest radiograph December 11, 2013 FINDINGS: Cardiomediastinal silhouette is normal. No pleural effusions or focal consolidations. Mild chronic bronchitic changes. Biapical scarring. Trachea projects midline and there is no pneumothorax. Soft tissue planes and included osseous structures are  non-suspicious. ACDF. PLIF. IMPRESSION: Mild chronic bronchitic changes without acute cardiopulmonary process. Electronically Signed   By: Elon Alas M.D.   On: 09/24/2016 00:36   Ct Angio Chest Pe W Or Wo Contrast  Result Date: 09/24/2016 CLINICAL DATA:  Chest and LEFT calf pain. Elevated D-dimer. Shortness of breath. History of hypertension. EXAM: CT ANGIOGRAPHY CHEST WITH CONTRAST TECHNIQUE: Multidetector CT imaging of the chest was performed using the standard protocol during bolus administration of intravenous contrast. Multiplanar CT image reconstructions and MIPs were obtained to evaluate the vascular anatomy. CONTRAST:  100 cc Isovue 370 COMPARISON:  Chest radiograph September 23, 2016 FINDINGS: CARDIOVASCULAR: Adequate contrast opacification of the pulmonary artery's. Main pulmonary artery is not enlarged. No pulmonary arterial filling  defects to the level of the subsegmental branches. Heart size is normal, no right heart strain. Mild coronary artery calcifications. No pericardial effusions. Thoracic aorta is normal course and caliber, trace calcific atherosclerosis. MEDIASTINUM/NODES: 11 mm short axis RIGHT hilar lymph node. Sub cm LEFT hilar lymph nodes. LUNGS/PLEURA: Tracheobronchial tree is patent though, demonstrates diffuse bronchial wall thickening, no pneumothorax. Reticular nodular scarring in lung apices bilaterally. Bibasilar dependent atelectasis. Mild bibasilar interlobular septal wall thickening associated with CHF. UPPER ABDOMEN: Small hiatal hernia.  Calcified hepatic granulomas. MUSCULOSKELETAL: Visualized soft tissues and included osseous structures appear normal. ACDF partially imaged. Review of the MIP images confirms the above findings. IMPRESSION: No acute pulmonary embolism. Bronchial wall thickening can be seen with reactive airway disease or or bronchitis without focal pneumonia. Mild RIGHT hilar lymphadenopathy is likely reactive. Interlobular septal wall thickening  associated with CHF. Electronically Signed   By: Elon Alas M.D.   On: 09/24/2016 06:23   Nm Myocar Multi W/spect W/wall Motion / Ef  Result Date: 09/25/2016 CLINICAL DATA:  Chest pain EXAM: MYOCARDIAL IMAGING WITH SPECT (REST AND EXERCISE) GATED LEFT VENTRICULAR WALL MOTION STUDY LEFT VENTRICULAR EJECTION FRACTION TECHNIQUE: Standard myocardial SPECT imaging was performed after resting intravenous injection of 10 mCi Tc-6m tetrofosmin. Subsequently, exercise tolerance test was performed by the patient under the supervision of the Cardiology staff. At peak-stress, 30 mCi Tc-54m tetrofosmin was injected intravenously and standard myocardial SPECT imaging was performed. Quantitative gated imaging was also performed to evaluate left ventricular wall motion, and estimate left ventricular ejection fraction. COMPARISON:  None available FINDINGS: Perfusion: Reversible anteroapical perfusion defect extends laterally compatible with inducible ischemia with exercise stress. No fixed defects. Wall Motion: Normal left ventricular wall motion. No left ventricular dilation. Left Ventricular Ejection Fraction: 69 % End diastolic volume 66 ml End systolic volume 20 ml IMPRESSION: 1. Positive exam for reversible anteroapical ischemia extending laterally. 2. Normal left ventricular wall motion. 3. Left ventricular ejection fraction 69% 4. Non invasive risk stratification*: Intermediate to high *2012 Appropriate Use Criteria for Coronary Revascularization Focused Update: J Am Coll Cardiol. [id number]. http://content.[email or web address].aspx?articleid=[email or web address] These results will be called to the ordering clinician or representative by the Radiologist Assistant, and communication documented in the PACS or zVision Dashboard. Electronically Signed   By: Jerilynn Mages.  Shick M.D.   On: 09/25/2016 14:16     Cardiac Cath    Mid RCA lesion, 20 %stenosed.  Prox RCA lesion, 10 %stenosed.  Mid LAD lesion, 25  %stenosed.  Ost 1st Diag lesion, 50 %stenosed.  The left ventricular systolic function is normal.  The left ventricular ejection fraction is 55-65% by visual estimate.  There is no aortic valve stenosis.   Discharge Exam: Vitals:   09/26/16 1303 09/26/16 1331  BP: 123/69 129/74  Pulse: 66 70  Resp:    Temp:     Vitals:   09/26/16 1234 09/26/16 1248 09/26/16 1303 09/26/16 1331  BP: (!) 118/56 131/67 123/69 129/74  Pulse: 66 66 66 70  Resp:      Temp:      TempSrc:      SpO2: 98% 97% 94%   Weight:      Height:        General: Pt is alert, awake, not in acute distress Cardiovascular: RRR, S1/S2 +, no rubs, no gallops Respiratory: CTA bilaterally, no wheezing, no rhonchi Abdominal: Soft, NT, ND, bowel sounds + Extremities: no edema, no cyanosis   The results of significant diagnostics from this hospitalization (including imaging, microbiology, ancillary and laboratory)  are listed below for reference.     Microbiology: No results found for this or any previous visit (from the past 240 hour(s)).   Labs: BNP (last 3 results) No results for input(s): BNP in the last 8760 hours. Basic Metabolic Panel:  Recent Labs Lab 09/23/16 2334 09/25/16 0341 09/26/16 0444  NA 140 142 141  K 3.6 3.6 3.9  CL 108 112* 112*  CO2 25 25 24   GLUCOSE 111* 102* 94  BUN 7 6 8   CREATININE 0.70 0.60 0.68  CALCIUM 8.7* 8.1* 8.6*   Liver Function Tests:  Recent Labs Lab 09/23/16 2334  AST 25  ALT 15  ALKPHOS 50  BILITOT 0.3  PROT 6.0*  ALBUMIN 3.4*    Recent Labs Lab 09/23/16 2334  LIPASE 28   No results for input(s): AMMONIA in the last 168 hours. CBC:  Recent Labs Lab 09/23/16 2334 09/25/16 0341 09/26/16 0444  WBC 7.9 4.2 5.9  NEUTROABS 4.6 1.5* 2.1  HGB 12.5 11.6* 12.6  HCT 37.5 35.5* 37.6  MCV 89.7 90.6 90.0  PLT 176 166 171   Cardiac Enzymes:  Recent Labs Lab 09/24/16 0155 09/24/16 0714 09/24/16 1400  TROPONINI <0.03 <0.03 <0.03    BNP: Invalid input(s): POCBNP CBG: No results for input(s): GLUCAP in the last 168 hours. D-Dimer  Recent Labs  09/24/16 0155  DDIMER 0.77*   Hgb A1c  Recent Labs  09/24/16 0317  HGBA1C 5.3   Lipid Profile  Recent Labs  09/24/16 0317  CHOL 166  HDL 54  LDLCALC 103*  TRIG 46  CHOLHDL 3.1   Thyroid function studies  Recent Labs  09/24/16 0317  TSH 1.205   Anemia work up No results for input(s): VITAMINB12, FOLATE, FERRITIN, TIBC, IRON, RETICCTPCT in the last 72 hours. Urinalysis    Component Value Date/Time   COLORURINE YELLOW 12/11/2013 1318   APPEARANCEUR CLEAR 12/11/2013 1318   LABSPEC 1.028 12/11/2013 1318   PHURINE 5.0 12/11/2013 1318   GLUCOSEU NEGATIVE 12/11/2013 1318   HGBUR NEGATIVE 12/11/2013 1318   BILIRUBINUR SMALL (A) 12/11/2013 1318   KETONESUR NEGATIVE 12/11/2013 1318   PROTEINUR NEGATIVE 12/11/2013 1318   UROBILINOGEN 1.0 12/11/2013 1318   NITRITE NEGATIVE 12/11/2013 1318   LEUKOCYTESUR NEGATIVE 12/11/2013 1318   Sepsis Labs Invalid input(s): PROCALCITONIN,  WBC,  LACTICIDVEN Microbiology No results found for this or any previous visit (from the past 240 hour(s)).   Time coordinating discharge: Over 30 minutes  SIGNED:  Chipper Oman, MD  [facility] 09/26/2016, 2:55 PM Pager   If 7PM-7AM, please contact night-coverage [email or web address] Password [email or web address]

## 2016-09-26 NOTE — H&P (View-Only) (Signed)
Patient Name: Sarah [name] Date of Encounter: 09/25/2016  Primary Cardiologist: Dr. Remi Deter Problem List     Principal Problem:   Chest pain Active Problems:   Obesity   Essential hypertension   Hypothyroidism   Pain of left calf     Subjective   No chest pain at rest. She tolerated exercise stress test ok. No exertional CP.   Inpatient Medications    Scheduled Meds: . aspirin  325 mg Oral Daily  . enoxaparin (LOVENOX) injection  40 mg Subcutaneous Q24H  . levothyroxine  75 mcg Oral QAC breakfast  . loratadine  10 mg Oral Daily  . losartan  25 mg Oral Q breakfast   Continuous Infusions: . sodium chloride 75 mL/hr at 09/24/16 1744   PRN Meds: acetaminophen, ALPRAZolam, morphine injection, nitroGLYCERIN, ondansetron, zolpidem   Vital Signs    Vitals:   09/25/16 1247 09/25/16 1251 09/25/16 1253 09/25/16 1255  BP: (!) 157/84 (!) 155/75 (!) 157/70 127/70  Pulse:      Resp:      Temp:      TempSrc:      SpO2:      Weight:      Height:        Intake/Output Summary (Last 24 hours) at 09/25/16 1618 Last data filed at 09/25/16 1336  Gross per 24 hour  Intake             2415 ml  Output             1000 ml  Net             1415 ml   Filed Weights   09/23/16 2336 09/24/16 0229 09/25/16 0500  Weight: 200 lb (90.7 kg) 207 lb 12.8 oz (94.3 kg) 206 lb 8 oz (93.7 kg)    Physical Exam   GEN: Well nourished, well developed, in no acute distress.  HEENT: Grossly normal.  Neck: Supple, no JVD, carotid bruits, or masses. Cardiac: RRR, no murmurs, rubs, or gallops. No clubbing, cyanosis, edema.  Radials/DP/PT 2+ and equal bilaterally.  Respiratory:  Respirations regular and unlabored, clear to auscultation bilaterally. GI: Soft, nontender, nondistended, BS + x 4. MS: no deformity or atrophy. Skin: warm and dry, no rash. Neuro:  Strength and sensation are intact. Psych: AAOx3.  Normal affect.  Labs    CBC  Recent Labs  09/23/16 2334  09/25/16 0341  WBC 7.9 4.2  NEUTROABS 4.6 1.5*  HGB 12.5 11.6*  HCT 37.5 35.5*  MCV 89.7 90.6  PLT 176 [id number]   Basic Metabolic Panel  Recent Labs  09/23/16 2334 09/25/16 0341  NA 140 142  K 3.6 3.6  CL 108 112*  CO2 25 25  GLUCOSE 111* 102*  BUN 7 6  CREATININE 0.70 0.60  CALCIUM 8.7* 8.1*   Liver Function Tests  Recent Labs  09/23/16 2334  AST 25  ALT 15  ALKPHOS 50  BILITOT 0.3  PROT 6.0*  ALBUMIN 3.4*    Recent Labs  09/23/16 2334  LIPASE 28   Cardiac Enzymes  Recent Labs  09/24/16 0155 09/24/16 0714 09/24/16 1400  TROPONINI <0.03 <0.03 <0.03   BNP Invalid input(s): POCBNP D-Dimer  Recent Labs  09/24/16 0155  DDIMER 0.77*   Hemoglobin A1C  Recent Labs  09/24/16 0317  HGBA1C 5.3   Fasting Lipid Panel  Recent Labs  09/24/16 0317  CHOL 166  HDL 54  LDLCALC 103*  TRIG 46  CHOLHDL 3.1  Thyroid Function Tests  Recent Labs  09/24/16 0317  TSH 1.205    Telemetry    NSR- Personally Reviewed  ECG    NSR - Personally Reviewed  Radiology    Dg Chest 2 View  Result Date: 09/24/2016 CLINICAL DATA:  Severe sharp aching centralized chest pain radiating to LEFT arm beginning at 7 p.m. tonight. Associated back pain, vomiting and diaphoresis. History of hypertension or reflux disease. EXAM: CHEST  2 VIEW COMPARISON:  Chest radiograph December 11, 2013 FINDINGS: Cardiomediastinal silhouette is normal. No pleural effusions or focal consolidations. Mild chronic bronchitic changes. Biapical scarring. Trachea projects midline and there is no pneumothorax. Soft tissue planes and included osseous structures are non-suspicious. ACDF. PLIF. IMPRESSION: Mild chronic bronchitic changes without acute cardiopulmonary process. Electronically Signed   By: Elon Alas M.D.   On: 09/24/2016 00:36   Ct Angio Chest Pe W Or Wo Contrast  Result Date: 09/24/2016 CLINICAL DATA:  Chest and LEFT calf pain. Elevated D-dimer. Shortness of breath.  History of hypertension. EXAM: CT ANGIOGRAPHY CHEST WITH CONTRAST TECHNIQUE: Multidetector CT imaging of the chest was performed using the standard protocol during bolus administration of intravenous contrast. Multiplanar CT image reconstructions and MIPs were obtained to evaluate the vascular anatomy. CONTRAST:  100 cc Isovue 370 COMPARISON:  Chest radiograph September 23, 2016 FINDINGS: CARDIOVASCULAR: Adequate contrast opacification of the pulmonary artery's. Main pulmonary artery is not enlarged. No pulmonary arterial filling defects to the level of the subsegmental branches. Heart size is normal, no right heart strain. Mild coronary artery calcifications. No pericardial effusions. Thoracic aorta is normal course and caliber, trace calcific atherosclerosis. MEDIASTINUM/NODES: 11 mm short axis RIGHT hilar lymph node. Sub cm LEFT hilar lymph nodes. LUNGS/PLEURA: Tracheobronchial tree is patent though, demonstrates diffuse bronchial wall thickening, no pneumothorax. Reticular nodular scarring in lung apices bilaterally. Bibasilar dependent atelectasis. Mild bibasilar interlobular septal wall thickening associated with CHF. UPPER ABDOMEN: Small hiatal hernia.  Calcified hepatic granulomas. MUSCULOSKELETAL: Visualized soft tissues and included osseous structures appear normal. ACDF partially imaged. Review of the MIP images confirms the above findings. IMPRESSION: No acute pulmonary embolism. Bronchial wall thickening can be seen with reactive airway disease or or bronchitis without focal pneumonia. Mild RIGHT hilar lymphadenopathy is likely reactive. Interlobular septal wall thickening associated with CHF. Electronically Signed   By: Elon Alas M.D.   On: 09/24/2016 06:23   Nm Myocar Multi W/spect W/wall Motion / Ef  Result Date: 09/25/2016 CLINICAL DATA:  Chest pain EXAM: MYOCARDIAL IMAGING WITH SPECT (REST AND EXERCISE) GATED LEFT VENTRICULAR WALL MOTION STUDY LEFT VENTRICULAR EJECTION FRACTION  TECHNIQUE: Standard myocardial SPECT imaging was performed after resting intravenous injection of 10 mCi Tc-52m tetrofosmin. Subsequently, exercise tolerance test was performed by the patient under the supervision of the Cardiology staff. At peak-stress, 30 mCi Tc-68m tetrofosmin was injected intravenously and standard myocardial SPECT imaging was performed. Quantitative gated imaging was also performed to evaluate left ventricular wall motion, and estimate left ventricular ejection fraction. COMPARISON:  None available FINDINGS: Perfusion: Reversible anteroapical perfusion defect extends laterally compatible with inducible ischemia with exercise stress. No fixed defects. Wall Motion: Normal left ventricular wall motion. No left ventricular dilation. Left Ventricular Ejection Fraction: 69 % End diastolic volume 66 ml End systolic volume 20 ml IMPRESSION: 1. Positive exam for reversible anteroapical ischemia extending laterally. 2. Normal left ventricular wall motion. 3. Left ventricular ejection fraction 69% 4. Non invasive risk stratification*: Intermediate to high *2012 Appropriate Use Criteria for Coronary Revascularization Focused Update: J Am  Coll Cardiol. [id number]. [email or web address]://[email or web address]?[email or web address] These results will be called to the ordering clinician or representative by the Radiologist Assistant, and communication documented in the PACS or zVision Dashboard. Electronically Signed   By: Jerilynn Mages.  Shick M.D.   On: 09/25/2016 14:16    Cardiac Studies   NST 09/25/16 FINDINGS: Perfusion: Reversible anteroapical perfusion defect extends laterally compatible with inducible ischemia with exercise stress. No fixed defects.  Wall Motion: Normal left ventricular wall motion. No left ventricular dilation.  Left Ventricular Ejection Fraction: 69 %  End diastolic volume 66 ml  End systolic volume 20 ml  IMPRESSION: 1. Positive exam for reversible anteroapical  ischemia extending laterally.  2. Normal left ventricular wall motion.  3. Left ventricular ejection fraction 69%  4. Non invasive risk stratification*: Intermediate to high  Patient Profile      Ms. Teeple is a 75 y.o.female history of HTN, GERD, hypothyroidism admitted with chest pain. Assessment & Plan    1. Chest pain with moderate risk for cardiac etiology: she ruled out for  MI with negative enzymes, however NST abnormal with defect, per above. Intermediate to high risk. She will need definitive LHC in the am. NPO at midnight. 2D echo pending.   Signed, Lyda Jester, PA-C  09/25/2016, 4:18 PM   I have examined the patient and reviewed assessment and plan and discussed with patient.  Agree with above as stated.  Chest pain concerning for cardiac origin.  POsitive stress test with family h/o CAD.  Risks and benefits of cath explained to the patient and she is agreeable to cath.  All questions answered.   [name]

## 2016-09-26 NOTE — Progress Notes (Signed)
TR band removed at 1341. A 2x2 guaze with tegaderm was applied. Site remains level 0. R pulses remain 2+, extremity is warm to touch, normal color for ethnicity. Vital signs stable. Pt instructed to not put any pressure or use arm. Pt instructed to call if arm begins to bleed. Pt verbalized understanding.

## 2016-09-26 NOTE — Discharge Instructions (Signed)

## 2016-09-26 NOTE — Progress Notes (Signed)
Patient discharged: Home with family  Via: Wheelchair   Discharge paperwork given: to patient and family  Reviewed with teach back  IV and telemetry disconnected  Belongings given to patient   Jariana Shumard, RN  

## 2016-09-27 ENCOUNTER — Other Ambulatory Visit: Payer: Self-pay

## 2016-09-27 ENCOUNTER — Telehealth: Payer: Self-pay | Admitting: Interventional Cardiology

## 2016-09-27 MED ORDER — NITROGLYCERIN 0.4 MG SL SUBL: 0.4000 mg | SUBLINGUAL_TABLET | SUBLINGUAL | 3 refills | Status: DC | PRN

## 2016-09-27 MED ORDER — ATORVASTATIN CALCIUM 10 MG PO TABS
10.0000 mg | ORAL_TABLET | Freq: Every day | ORAL | 6 refills | Status: DC
Start: 2016-09-27 — End: 2017-04-12

## 2016-09-27 MED FILL — Nitroglycerin IV Soln 100 MCG/ML in D5W: INTRA_ARTERIAL | Qty: 10 | Status: AC

## 2016-09-27 NOTE — Telephone Encounter (Signed)
**Note De-Identified Caddie Randle Obfuscation** Per Dr Irish Lack the pt needs Rx's for Lipitor 10 mg to be taken daily (#30 with 6 refills) and NTG 0.4 mg to be taken as needed (#25 with 3 refills) for chest pain/discomfort. I have gone over NTG dosing/instructions with the pt over the phone and answered all of her questions. Per the pts request I have sent RX's to [facility] in [place] to fill.

## 2016-09-27 NOTE — Telephone Encounter (Signed)
New message   Pt verbalized that she is calling for the rn because she was sent home from the hospital and she has not received any medications but she was told that she would get Lipitor (generic) and nitroglycerin

## 2016-09-29 ENCOUNTER — Encounter: Payer: Self-pay | Admitting: Cardiology

## 2016-10-03 NOTE — Research (Signed)
CADLAD Informed Consent   Subject Name: Sarah [name]  Subject met inclusion and exclusion criteria.  The informed consent form, study requirements and expectations were reviewed with the subject and questions and concerns were addressed prior to the signing of the consent form.  The subject verbalized understanding of the trail requirements.  The subject agreed to participate in the CADLAD trial and signed the informed consent.  The informed consent was obtained prior to performance of any protocol-specific procedures for the subject.  A copy of the signed informed consent was given to the subject and a copy was placed in the subject's medical record.  Sandie Ano , 09/26/16 09:15

## 2016-10-13 ENCOUNTER — Ambulatory Visit (INDEPENDENT_AMBULATORY_CARE_PROVIDER_SITE_OTHER): Payer: Medicare Other | Admitting: Cardiology

## 2016-10-13 ENCOUNTER — Encounter: Payer: Self-pay | Admitting: Cardiology

## 2016-10-13 VITALS — BP 112/70 | HR 76 | Ht 67.0 in | Wt 199.1 lb

## 2016-10-13 DIAGNOSIS — I10 Essential (primary) hypertension: Secondary | ICD-10-CM

## 2016-10-13 DIAGNOSIS — E782 Mixed hyperlipidemia: Secondary | ICD-10-CM | POA: Diagnosis not present

## 2016-10-13 NOTE — Patient Instructions (Signed)
Medication Instructions:  You may hold your Lipitor until lab work is repeated in 6 months   Labwork: Your physician recommends that you return for lab work in: 6 months on the day of or a few days before your office visit with Dr. Glennon Hamilton will need to FAST for this appointment - nothing to eat or drink after midnight the night before except water.   Testing/Procedure: None Ordered   Follow-Up: Your physician wants you to follow-up in: 6 months with Dr. Irish Lack.  You will receive a reminder letter in the mail two months in advance. If you don't receive a letter, please call our office to schedule the follow-up appointment.   If you need a refill on your cardiac medications before your next appointment, please call your pharmacy.   Thank you for choosing [facility]! Christen Bame, RN [phone]   Fat and Cholesterol Restricted Diet High levels of fat and cholesterol in your blood may lead to various health problems, such as diseases of the heart, blood vessels, gallbladder, liver, and pancreas. Fats are concentrated sources of energy that come in various forms. Certain types of fat, including saturated fat, may be harmful in excess. Cholesterol is a substance needed by your body in small amounts. Your body makes all the cholesterol it needs. Excess cholesterol comes from the food you eat. When you have high levels of cholesterol and saturated fat in your blood, health problems can develop because the excess fat and cholesterol will gather along the walls of your blood vessels, causing them to narrow. Choosing the right foods will help you control your intake of fat and cholesterol. This will help keep the levels of these substances in your blood within normal limits and reduce your risk of disease. WHAT IS MY PLAN? Your health care provider recommends that you:  Get no more than __________ % of the total calories in your daily diet from fat.  Limit your intake of  saturated fat to less than ______% of your total calories each day.  Limit the amount of cholesterol in your diet to less than _________mg per day. WHAT TYPES OF FAT SHOULD I CHOOSE?  Choose healthy fats more often. Choose monounsaturated and polyunsaturated fats, such as olive and canola oil, flaxseeds, walnuts, almonds, and seeds.  Eat more omega-3 fats. Good choices include salmon, mackerel, sardines, tuna, flaxseed oil, and ground flaxseeds. Aim to eat fish at least two times a week.  Limit saturated fats. Saturated fats are primarily found in animal products, such as meats, butter, and cream. Plant sources of saturated fats include palm oil, palm kernel oil, and coconut oil.  Avoid foods with partially hydrogenated oils in them. These contain trans fats. Examples of foods that contain trans fats are stick margarine, some tub margarines, cookies, crackers, and other baked goods. WHAT GENERAL GUIDELINES DO I NEED TO FOLLOW? These guidelines for healthy eating will help you control your intake of fat and cholesterol:  Check food labels carefully to identify foods with trans fats or high amounts of saturated fat.  Fill one half of your plate with vegetables and green salads.  Fill one fourth of your plate with whole grains. Look for the word "whole" as the first word in the ingredient list.  Fill one fourth of your plate with lean protein foods.  Limit fruit to two servings a day. Choose fruit instead of juice.  Eat more foods that contain soluble fiber. Examples of foods that contain this type of fiber are  apples, broccoli, carrots, beans, peas, and barley. Aim to get 20-30 g of fiber per day.  Eat more home-cooked food and less restaurant, buffet, and fast food.  Limit or avoid alcohol.  Limit foods high in starch and sugar.  Limit fried foods.  Cook foods using methods other than frying. Baking, boiling, grilling, and broiling are all great options.  Lose weight if you are  overweight. Losing just 5-10% of your initial body weight can help your overall health and prevent diseases such as diabetes and heart disease. WHAT FOODS CAN I EAT? Grains Whole grains, such as whole wheat or whole grain breads, crackers, cereals, and pasta. Unsweetened oatmeal, bulgur, barley, quinoa, or brown rice. Corn or whole wheat flour tortillas. Vegetables Fresh or frozen vegetables (raw, steamed, roasted, or grilled). Green salads. Fruits All fresh, canned (in natural juice), or frozen fruits. Meat and Other Protein Products Ground beef (85% or leaner), grass-fed beef, or beef trimmed of fat. Skinless chicken or [place]. Ground chicken or [place]. Pork trimmed of fat. All fish and seafood. Eggs. Dried beans, peas, or lentils. Unsalted nuts or seeds. Unsalted canned or dry beans. Dairy Low-fat dairy products, such as skim or 1% milk, 2% or reduced-fat cheeses, low-fat ricotta or cottage cheese, or plain low-fat yogurt. Fats and Oils Tub margarines without trans fats. Light or reduced-fat mayonnaise and salad dressings. Avocado. Olive, canola, sesame, or safflower oils. Natural peanut or almond butter (choose ones without added sugar and oil). The items listed above may not be a complete list of recommended foods or beverages. Contact your dietitian for more options. WHAT FOODS ARE NOT RECOMMENDED? Grains White bread. White pasta. White rice. Cornbread. Bagels, pastries, and croissants. Crackers that contain trans fat. Vegetables White potatoes. Corn. Creamed or fried vegetables. Vegetables in a cheese sauce. Fruits Dried fruits. Canned fruit in light or heavy syrup. Fruit juice. Meat and Other Protein Products Fatty cuts of meat. Ribs, chicken wings, bacon, sausage, bologna, salami, chitterlings, fatback, hot dogs, bratwurst, and packaged luncheon meats. Liver and organ meats. Dairy Whole or 2% milk, cream, half-and-half, and cream cheese. Whole milk cheeses. Whole-fat or sweetened  yogurt. Full-fat cheeses. Nondairy creamers and whipped toppings. Processed cheese, cheese spreads, or cheese curds. Sweets and Desserts Corn syrup, sugars, honey, and molasses. Candy. Jam and jelly. Syrup. Sweetened cereals. Cookies, pies, cakes, donuts, muffins, and ice cream. Fats and Oils Butter, stick margarine, lard, shortening, ghee, or bacon fat. Coconut, palm kernel, or palm oils. Beverages Alcohol. Sweetened drinks (such as sodas, lemonade, and fruit drinks or punches). The items listed above may not be a complete list of foods and beverages to avoid. Contact your dietitian for more information.   This information is not intended to replace advice given to you by your health care provider. Make sure you discuss any questions you have with your health care provider.   Document Released: 11/21/2005 Document Revised: 12/12/2014 Document Reviewed: 02/19/2014 Elsevier Interactive Patient Education [employer].

## 2016-10-13 NOTE — Progress Notes (Signed)
10/13/2016 Sarah [name]   1941/11/03  [id number]  Primary Physician Woody Seller, MD Primary Cardiologist: Dr. Irish Lack    Reason for Visit/CC: [facility] f/u for CP/ CAD  HPI:  Mrs. Nuckolls is a 75 year old female who presents to clinic for post hospital follow-up after recent admission for chest pain evaluation. She has a past medical history which includes HTN, GERD, and hypothyroidism. She presented to [facility] on  09/24/16 with a CC of chest pain. Pt notes BP was high at 190/100. She as admitted for further workup. Workup was negative for PE/DVT. She ruled out for MI with negative cardiac enzymes 3 however she underwent a nuclear stress test which was interpreted as a high risk study which demonstrated reversible anteroapical perfusion defect extends laterally compatible with inducible ischemia with exercise stress. No fixed defects. Subsequently,  she underwent definitive left heart catheterization which was performed by Dr. Irish Lack on 09/26/16. 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 Angiographic details are outlined below. There was no indication for PCI. Continued medical therapy was recommended. Dr. Irish Lack recommended sublingual nitroglycerin at discharge. He also advised that if she were to have recurrent chest discomfort, we could try Imdur. Echo showed normal LV systolic function with an ejection fraction of 60-65%. Grade 2 diastolic dysfunction was noted. No significant valvular abnormalities. Her losartan was increased from 12.5 to 25 mg daily. She was discharged home in stable condition.   She presents back to clinic today for post hospital follow-up. EKG shows normal sinus rhythm. No ischemic abnormalities. Heart rate is 68 bpm. Blood pressure stable at 112/70. She reports that she has done fairly well since discharge. She unfortunately, could not tolerate her statin and stopped it per PCP. She was discharge home on 10 mg nightly of Lipitor. LDL  was 103. She denies any recurrent CP. No dyspnea. BP is well controlled at 112/70.   LHC 09/26/16 Conclusion     Mid RCA lesion, 20 %stenosed.  Prox RCA lesion, 10 %stenosed.  Mid LAD lesion, 25 %stenosed.  Ost 1st Diag lesion, 50 %stenosed.  The left ventricular systolic function is normal.  The left ventricular ejection fraction is 55-65% by visual estimate.  There is no aortic valve stenosis.      Current Meds  Medication Sig  . aspirin EC 81 MG tablet Take 1 tablet (81 mg total) by mouth daily.  Marland Kitchen estradiol (ESTRACE) 1 MG tablet Take 0.5 mg by mouth daily with breakfast.   . fexofenadine-pseudoephedrine (ALLEGRA-D 24) 180-240 MG per 24 hr tablet Take 1 tablet by mouth every evening.   Marland Kitchen levothyroxine (SYNTHROID, LEVOTHROID) 75 MCG tablet Take 75 mcg by mouth every morning.   Marland Kitchen losartan (COZAAR) 50 MG tablet Take 25 mg by mouth daily with breakfast.   . nitroGLYCERIN (NITROSTAT) 0.4 MG SL tablet Place 1 tablet (0.4 mg total) under the tongue every 5 (five) minutes as needed for chest pain.   Allergies  Allergen Reactions  . Adhesive [Tape] Swelling    redness  . Atorvastatin Other (See Comments)    Fatigue, leg cramps  . Other     Band-Aid cause patient to swell  . Oxycodone-Acetaminophen Nausea And Vomiting  . Penicillins Hives  . Voltaren [Diclofenac Sodium] Rash    hives   Past Medical History:  Diagnosis Date  . Anemia   . Degenerative arthritis   . Headache(784.0)    sinus  . Hypertension   . Hypothyroidism   . Obesity   .  Syncope, near 1 year ago   Family History  Problem Relation Age of Onset  . Alzheimer's disease Sister   . Cancer - Lung Sister   . Heart disease Brother   . Heart disease Brother   . Heart disease Brother   . Cancer Brother     Bladder cancer   Past Surgical History:  Procedure Laterality Date  . ABDOMINAL HYSTERECTOMY     partial  . arthroscopic knee surgery  July 03, 2013  . CARDIAC CATHETERIZATION N/A 09/26/2016     Procedure: Left Heart Cath and Coronary Angiography;  Surgeon: Jettie Booze, MD;  Location: [facility];  Service: Cardiovascular;  Laterality: N/A;  . CARPAL TUNNEL RELEASE Bilateral   . CATARACT EXTRACTION, BILATERAL    . CERVICAL SPINE SURGERY    . cyst removed from eyebrow  4-5 yrs ago  . LUMBAR SPINE SURGERY    . TONSILLECTOMY  age 19 or 61  . TOTAL KNEE ARTHROPLASTY Right 12/18/2013   Procedure: RIGHT TOTAL KNEE ARTHROPLASTY;  Surgeon: Tobi Bastos, MD;  Location: [facility];  Service: Orthopedics;  Laterality: Right;   Social History   Social History  . Marital status: Married    Spouse name: N/A  . Number of children: 3  . Years of education: 12   Occupational History  . Not on file.   Social History Main Topics  . Smoking status: Never Smoker  . Smokeless tobacco: Never Used  . Alcohol use No  . Drug use: No  . Sexual activity: Not on file   Other Topics Concern  . Not on file   Social History Narrative  . No narrative on file     Review of Systems: General: negative for chills, fever, night sweats or weight changes.  Cardiovascular: negative for chest pain, dyspnea on exertion, edema, orthopnea, palpitations, paroxysmal nocturnal dyspnea or shortness of breath Dermatological: negative for rash Respiratory: negative for cough or wheezing Urologic: negative for hematuria Abdominal: negative for nausea, vomiting, diarrhea, bright red blood per rectum, melena, or hematemesis Neurologic: negative for visual changes, syncope, or dizziness All other systems reviewed and are otherwise negative except as noted above.   Physical Exam:  Blood pressure 112/70, pulse 76, height 5\' 7"  (1.702 m), weight 199 lb 1.9 oz (90.3 kg), SpO2 96 %.  General appearance: alert, cooperative and no distress Neck: no carotid bruit and no JVD Lungs: clear to auscultation bilaterally Heart: regular rate and rhythm, S1, S2 normal, no murmur, click, rub or  gallop Extremities: extremities normal, atraumatic, no cyanosis or edema Pulses: 2+ and symmetric Skin: warm and dry Neurologic: Grossly normal  EKG NSR. No ischemia. 68 bpm.   ASSESSMENT AND PLAN:   1. CAD: mild nonobstructive CAD on cath. EF normal. No indication for PCI. Medical therapy elected. She denies any recurrent CP. HR and BP both stable. Continue ASA and ARB. She is not tolerating statin due to myalgias. Will attempt to control through diet and exercise.   2. HLD: LDL was 103 mg/dL. She was placed on low dose statin, Lipitor 10 mg, however could not tolerate this due to myalgias. It is reasonable to attempt control through diet and exercise. We discussed low fat diet. Patient given education materials regarding low fat/heart healthy diet. She also plans to increase her physical activity. She has access to an indoor track at her church. I recommended 30 min of walking/day. We will recheck a fasting lipid panel in 6 months. If not at  goal, would recommend retrial of statin.   3. HTN: BP better controlled and stable after increase of losartan.   PLAN  F/u in 6 months.   Lyda Jester PA-C 10/13/2016 10:49 AM

## 2017-04-12 ENCOUNTER — Ambulatory Visit (INDEPENDENT_AMBULATORY_CARE_PROVIDER_SITE_OTHER): Payer: Medicare Other | Admitting: Interventional Cardiology

## 2017-04-12 ENCOUNTER — Encounter: Payer: Self-pay | Admitting: Interventional Cardiology

## 2017-04-12 VITALS — BP 110/64 | HR 70 | Ht 67.0 in | Wt 181.4 lb

## 2017-04-12 DIAGNOSIS — I25119 Atherosclerotic heart disease of native coronary artery with unspecified angina pectoris: Secondary | ICD-10-CM | POA: Diagnosis not present

## 2017-04-12 DIAGNOSIS — I10 Essential (primary) hypertension: Secondary | ICD-10-CM

## 2017-04-12 DIAGNOSIS — I2510 Atherosclerotic heart disease of native coronary artery without angina pectoris: Secondary | ICD-10-CM | POA: Insufficient documentation

## 2017-04-12 DIAGNOSIS — R600 Localized edema: Secondary | ICD-10-CM | POA: Diagnosis not present

## 2017-04-12 NOTE — Progress Notes (Signed)
Cardiology Office Note   Date:  04/12/2017   ID:  Sarah [name], DOB [birth year], MRN [id number]  PCP:  Christain Sacramento, MD    No chief complaint on file. f/u HTN, CAD   Wt Readings from Last 3 Encounters:  04/12/17 181 lb 6.4 oz (82.3 kg)  10/13/16 199 lb 1.9 oz (90.3 kg)  09/26/16 205 lb 4.8 oz (93.1 kg)       History of Present Illness: Sarah [name] is a 76 y.o. female  Who had chest pain and HTN.  SHe had a cath in10/17.  She had nonobstructive CAD by cath.    BP has been controlled.  Occasionally low in the mornings.  No syncope.  She continues her losartan.   She feels fine with her regular walking.  She has not used NTG under her tongue recently.  She has had some swelling in her legs, worse in hot weather.  She had an ingrown toenail that was intervened upon.    Past Medical History:  Diagnosis Date  . Anemia   . Degenerative arthritis   . Headache(784.0)    sinus  . Hypertension   . Hypothyroidism   . Obesity   . Syncope, near 1 year ago    Past Surgical History:  Procedure Laterality Date  . ABDOMINAL HYSTERECTOMY     partial  . arthroscopic knee surgery  July 03, 2013  . CARDIAC CATHETERIZATION N/A 09/26/2016   Procedure: Left Heart Cath and Coronary Angiography;  Surgeon: Jettie Booze, MD;  Location: [facility];  Service: Cardiovascular;  Laterality: N/A;  . CARPAL TUNNEL RELEASE Bilateral   . CATARACT EXTRACTION, BILATERAL    . CERVICAL SPINE SURGERY    . cyst removed from eyebrow  4-5 yrs ago  . LUMBAR SPINE SURGERY    . TONSILLECTOMY  age 50 or 3  . TOTAL KNEE ARTHROPLASTY Right 12/18/2013   Procedure: RIGHT TOTAL KNEE ARTHROPLASTY;  Surgeon: Tobi Bastos, MD;  Location: [facility];  Service: Orthopedics;  Laterality: Right;     Current Outpatient Prescriptions  Medication Sig Dispense Refill  . aspirin EC 81 MG tablet Take 1 tablet (81 mg total) by mouth daily. 30 tablet 0  . estradiol (ESTRACE) 1 MG tablet  Take 0.5 mg by mouth daily with breakfast.     . fexofenadine-pseudoephedrine (ALLEGRA-D 24) 180-240 MG per 24 hr tablet Take 1 tablet by mouth every evening.     . fluticasone (FLONASE) 50 MCG/ACT nasal spray Place 1 spray into both nostrils daily.    Marland Kitchen levothyroxine (SYNTHROID, LEVOTHROID) 75 MCG tablet Take 75 mcg by mouth every morning.     Marland Kitchen losartan (COZAAR) 50 MG tablet Take 25 mg by mouth daily with breakfast.     . nitroGLYCERIN (NITROSTAT) 0.4 MG SL tablet Place 1 tablet (0.4 mg total) under the tongue every 5 (five) minutes as needed for chest pain. 25 tablet 3   No current facility-administered medications for this visit.     Allergies:   Adhesive [tape]; Atorvastatin; Other; Oxycodone-acetaminophen; Penicillins; and Voltaren [diclofenac sodium]    Social History:  The patient  reports that she has never smoked. She has never used smokeless tobacco. She reports that she does not drink alcohol or use drugs.   Family History:  The patient's family history includes Alzheimer's disease in her sister; Cancer in her brother; Cancer - Lung in her sister; Heart disease in her brother, brother, and brother.  ROS:  Please see the history of present illness.   Otherwise, review of systems are positive for leg swelling.   All other systems are reviewed and negative.    PHYSICAL EXAM: VS:  BP 110/64 (BP Location: Left Arm, Patient Position: Sitting, Cuff Size: Normal)   Pulse 70   Ht 5\' 7"  (1.702 m)   Wt 181 lb 6.4 oz (82.3 kg)   SpO2 97%   BMI 28.41 kg/m  , BMI Body mass index is 28.41 kg/m. GEN: Well nourished, well developed, in no acute distress  HEENT: normal  Neck: no JVD, carotid bruits, or masses Cardiac: RRR; no murmurs, rubs, or gallops,; bilateral ankle edema  Respiratory:  clear to auscultation bilaterally, normal work of breathing GI: soft, nontender, nondistended, + BS MS: no deformity or atrophy  Skin: warm and dry, no rash Neuro:  Strength and sensation are  intact Psych: euthymic mood, full affect    Recent Labs: 09/23/2016: ALT 15 09/24/2016: TSH 1.205 09/26/2016: BUN 8; Creatinine, Ser 0.68; Hemoglobin 12.6; Platelets 171; Potassium 3.9; Sodium 141   Lipid Panel    Component Value Date/Time   CHOL 166 09/24/2016 0317   TRIG 46 09/24/2016 0317   HDL 54 09/24/2016 0317   CHOLHDL 3.1 09/24/2016 0317   VLDL 9 09/24/2016 0317   LDLCALC 103 (H) 09/24/2016 0317     Other studies Reviewed: Additional studies/ records that were reviewed today with results demonstrating: cath report from 10/17.   ASSESSMENT AND PLAN:  1. CAD: Mild to moderate.  No angina at this time on medical therapy. She has NTG to use if needed. 2. Leg edema: I encouraged her to elevate her legs. 3. HTN: Low readings are in the morning.  SHe may take her medicine a little later in the day.    Current medicines are reviewed at length with the patient today.  The patient concerns regarding her medicines were addressed.  The following changes have been made:  No change  Labs/ tests ordered today include:  No orders of the defined types were placed in this encounter.   Recommend 150 minutes/week of aerobic exercise Low fat, low carb, high fiber diet recommended  Disposition:   FU in prn; she can continue BP checks with Dr. Redmond Pulling.   Signed, [name], MD  04/12/2017 3:03 PM    [facility] [address], [place], [place]  [address] Phone: [phone]; Fax: [phone]

## 2017-04-12 NOTE — Patient Instructions (Signed)
Medication Instructions:  Your physician recommends that you continue on your current medications as directed. Please refer to the Current Medication list given to you today.   Labwork: None ordered.  Testing/Procedures: None ordered.  Follow-Up: Follow up as needed.  Any Other Special Instructions Will Be Listed Below (If Applicable).     If you need a refill on your cardiac medications before your next appointment, please call your pharmacy.  

## 2018-01-25 ENCOUNTER — Other Ambulatory Visit: Payer: Self-pay | Admitting: Interventional Cardiology

## 2018-10-09 ENCOUNTER — Other Ambulatory Visit: Payer: Self-pay | Admitting: Plastic Surgery

## 2018-10-15 ENCOUNTER — Other Ambulatory Visit: Payer: Self-pay | Admitting: Plastic Surgery

## 2018-10-15 DIAGNOSIS — IMO0002 Reserved for concepts with insufficient information to code with codable children: Secondary | ICD-10-CM

## 2018-10-15 DIAGNOSIS — R229 Localized swelling, mass and lump, unspecified: Principal | ICD-10-CM

## 2018-10-17 ENCOUNTER — Ambulatory Visit
Admission: RE | Admit: 2018-10-17 | Discharge: 2018-10-17 | Disposition: A | Discharge status: Home / Self Care | Payer: Medicare Other | Source: Ambulatory Visit | Attending: Plastic Surgery | Admitting: Plastic Surgery

## 2018-10-17 DIAGNOSIS — IMO0002 Reserved for concepts with insufficient information to code with codable children: Secondary | ICD-10-CM

## 2018-10-17 DIAGNOSIS — R229 Localized swelling, mass and lump, unspecified: Principal | ICD-10-CM

## 2018-10-17 MED ORDER — IOPAMIDOL (ISOVUE-300) INJECTION 61%
75.0000 mL | Freq: Once | INTRAVENOUS | Status: AC | PRN
  Administered 2018-10-17: 75 mL via INTRAVENOUS

## 2019-02-11 ENCOUNTER — Other Ambulatory Visit: Payer: Self-pay | Admitting: Gastroenterology

## 2019-04-05 ENCOUNTER — Ambulatory Visit (HOSPITAL_COMMUNITY): Admit: 2019-04-05 | Payer: Medicare Other | Admitting: Gastroenterology

## 2019-04-05 ENCOUNTER — Encounter (HOSPITAL_COMMUNITY): Payer: Self-pay

## 2019-04-05 SURGERY — COLONOSCOPY WITH PROPOFOL
Anesthesia: Monitor Anesthesia Care

## 2019-10-29 ENCOUNTER — Ambulatory Visit: Payer: Medicare Other | Admitting: Neurology

## 2019-10-29 ENCOUNTER — Other Ambulatory Visit: Payer: Self-pay

## 2019-10-29 ENCOUNTER — Encounter: Payer: Self-pay | Admitting: Neurology

## 2019-10-29 VITALS — BP 134/70 | HR 54 | Ht 67.0 in | Wt 172.0 lb

## 2019-10-29 DIAGNOSIS — G4489 Other headache syndrome: Secondary | ICD-10-CM | POA: Diagnosis not present

## 2019-10-29 DIAGNOSIS — R55 Syncope and collapse: Secondary | ICD-10-CM

## 2019-10-29 MED ORDER — TOPIRAMATE 25 MG PO TABS: ORAL_TABLET | ORAL | 3 refills | Status: DC

## 2019-10-29 NOTE — Progress Notes (Signed)
Reason for visit: Headache, syncope  Referring physician: Dr. Thompson Caul is a 78 y.o. female  History of present illness:  Ms. Monell is a 78 year old right-handed white female with a history of recent onset of headache and at least one syncopal event.  The patient was seen through this office in 2013 and 2014 for similar problems, at that time she was found to have an abnormal EEG study and she was placed on Trileptal.  The patient was lost to follow-up after 2014, she claims that she went off of the Trileptal and has done quite well without headaches or syncope until just recently.  In March 2020, the patient apparently fell with a dizzy spell, she fractured an ankle at that time.  About 6 weeks ago, the patient began having some headaches in the frontal areas in the occipital area.  On 08 September 2019 she was walking across the road and then suddenly felt dizzy, she blacked out for a few seconds and fell.  The patient was with her daughter-in-law, there was no apparent stiffening or jerking or tongue biting or loss of bowel or bladder control with this event.  The patient is not having further blackout events but she is having frequent episodes of dizziness that may occur at times with sitting but usually with standing.  She feels wobbly and weak in the legs that has been present since the fall.  She does have some chronic neck pain but this predated the fall.  She denies any pain radiating from the neck down the arms on either side.  She claims that the headaches are not associated with nausea or vomiting or any vision changes or any cognitive changes.  The headaches may come and go, she may have 3 or 4 headaches a week, she takes Tylenol with this with some benefit.  She reports no numbness of the extremities, only slight weakness of the legs.  She drinks up to 4 cups of coffee daily.  She returns for an evaluation at this time.  She reports no recent medication changes.  Past  Medical History:  Diagnosis Date  . Anemia   . Degenerative arthritis   . Headache(784.0)    sinus  . Hypertension   . Hypothyroidism   . Obesity   . Syncope, near 1 year ago    Past Surgical History:  Procedure Laterality Date  . ABDOMINAL HYSTERECTOMY     partial  . arthroscopic knee surgery  July 03, 2013  . CARDIAC CATHETERIZATION N/A 09/26/2016   Procedure: Left Heart Cath and Coronary Angiography;  Surgeon: Jettie Booze, MD;  Location: [facility];  Service: Cardiovascular;  Laterality: N/A;  . CARPAL TUNNEL RELEASE Bilateral   . CATARACT EXTRACTION, BILATERAL    . CERVICAL SPINE SURGERY    . cyst removed from eyebrow  4-5 yrs ago  . LUMBAR SPINE SURGERY    . TONSILLECTOMY  age 46 or 22  . TOTAL KNEE ARTHROPLASTY Right 12/18/2013   Procedure: RIGHT TOTAL KNEE ARTHROPLASTY;  Surgeon: Tobi Bastos, MD;  Location: [facility];  Service: Orthopedics;  Laterality: Right;    Family History  Problem Relation Age of Onset  . Alzheimer's disease Sister   . Cancer - Lung Sister   . Heart disease Brother   . Heart disease Brother   . Heart disease Brother   . Cancer Brother        Bladder cancer    Social history:  reports that she has never smoked. She has never used smokeless tobacco. She reports that she does not drink alcohol or use drugs.  Medications:  Prior to Admission medications   Medication Sig Start Date End Date Taking? Authorizing Provider  estradiol (ESTRACE) 1 MG tablet Take 0.5 mg by mouth daily with breakfast.  05/07/13  Yes [provider]  fluticasone (FLONASE) 50 MCG/ACT nasal spray Place into both nostrils daily.   Yes [provider]  levocetirizine (XYZAL) 5 MG tablet Take 5 mg by mouth every evening.   Yes [provider]  levothyroxine (SYNTHROID, LEVOTHROID) 75 MCG tablet Take 75 mcg by mouth every morning.  04/08/13  Yes [provider]      Allergies  Allergen Reactions  . Adhesive [Tape]  Swelling    redness  . Atorvastatin Other (See Comments)    Fatigue, leg cramps  . Other     Band-Aid cause patient to swell  . Oxycodone-Acetaminophen Nausea And Vomiting  . Penicillins Hives  . Voltaren [Diclofenac Sodium] Rash    hives    ROS:  Out of a complete 14 system review of symptoms, the patient complains only of the following symptoms, and all other reviewed systems are negative.  Dizziness Headache Allergies  Blood pressure 134/70, pulse (!) 54, height 5\' 7"  (1.702 m), weight 172 lb (78 kg).   Blood pressure, right arm, sitting is 148/78.  Blood pressure, right arm, standing is 140/82.  Physical Exam  General: The patient is alert and cooperative at the time of the examination.  The patient is minimally obese.  Eyes: Pupils are equal, round, and reactive to light. Discs are flat bilaterally.  Neck: The neck is supple, no carotid bruits are noted.  Respiratory: The respiratory examination is clear.  Cardiovascular: The cardiovascular examination reveals a regular rate and rhythm, no obvious murmurs or rubs are noted.  Skin: Extremities are with 1+ edema at the ankles bilaterally.  Neurologic Exam  Mental status: The patient is alert and oriented x 3 at the time of the examination. The patient has apparent normal recent and remote memory, with an apparently normal attention span and concentration ability.  Cranial nerves: Facial symmetry is present. There is good sensation of the face to pinprick and soft touch bilaterally. The strength of the facial muscles and the muscles to head turning and shoulder shrug are normal bilaterally. Speech is well enunciated, no aphasia or dysarthria is noted. Extraocular movements are full. Visual fields are full. The tongue is midline, and the patient has symmetric elevation of the soft palate. No obvious hearing deficits are noted.  Motor: The motor testing reveals 5 over 5 strength of all 4 extremities. Good symmetric motor  tone is noted throughout.  Sensory: Sensory testing is intact to pinprick, soft touch, vibration sensation, and position sense on all 4 extremities. No evidence of extinction is noted.  Coordination: Cerebellar testing reveals good finger-nose-finger and heel-to-shin bilaterally.  Gait and station: Gait is normal. Tandem gait is slightly unsteady. Romberg is negative. No drift is seen.  Reflexes: Deep tendon reflexes are symmetric and normal bilaterally. Toes are downgoing bilaterally.   Assessment/Plan:  1.  Headache, recent recurrence  2.  Recent syncopal event, dizziness  The patient will be set up for a work-up at this point.  She will have blood work done today, she will have MRI of the brain.  An EEG study will be done.  We will start Topamax taking 25 mg twice daily for  2 weeks and go to 50 mg twice daily.  She will undergo a cardiac monitor study for 30 days.  She will follow-up here in 3 months.  The most recent event is similar to what she had in 2013, the description of the blackout does not seem typical for a seizure however.  Jill Alexanders MD 10/29/2019 8:00 AM  [facility] [address] [place], [place] [address]  Phone [phone] Fax [phone]

## 2019-10-29 NOTE — Patient Instructions (Signed)
We will start Topamax for the headache.   Topamax (topiramate) is a seizure medication that has an FDA approval for seizures and for migraine headache. Potential side effects of this medication include weight loss, cognitive slowing, tingling in the fingers and toes, and carbonated drinks will taste bad. If any significant side effects are noted on this drug, please contact our office.  

## 2019-10-30 ENCOUNTER — Telehealth: Payer: Self-pay

## 2019-10-30 LAB — COMPREHENSIVE METABOLIC PANEL
ALT: 9 IU/L (ref 0–32)
AST: 16 IU/L (ref 0–40)
Albumin/Globulin Ratio: 1.5 (ref 1.2–2.2)
Albumin: 3.6 g/dL — ABNORMAL LOW (ref 3.7–4.7)
Alkaline Phosphatase: 64 IU/L (ref 39–117)
BUN/Creatinine Ratio: 16 (ref 12–28)
BUN: 10 mg/dL (ref 8–27)
Bilirubin Total: 0.5 mg/dL (ref 0.0–1.2)
CO2: 22 mmol/L (ref 20–29)
Calcium: 8.9 mg/dL (ref 8.7–10.3)
Chloride: 108 mmol/L — ABNORMAL HIGH (ref 96–106)
Creatinine, Ser: 0.64 mg/dL (ref 0.57–1.00)
GFR calc Af Amer: 100 mL/min/{1.73_m2} (ref >59)
GFR calc non Af Amer: 86 mL/min/{1.73_m2} (ref >59)
Globulin, Total: 2.4 g/dL (ref 1.5–4.5)
Glucose: 89 mg/dL (ref 65–99)
Potassium: 4.4 mmol/L (ref 3.5–5.2)
Sodium: 143 mmol/L (ref 134–144)
Total Protein: 6 g/dL (ref 6.0–8.5)

## 2019-10-30 LAB — SEDIMENTATION RATE: Sed Rate: 2 mm/hr (ref 0–40)

## 2019-10-30 NOTE — Telephone Encounter (Signed)
I reached out to the pt and advised of labs. She verbalized understanding and had no questions at this time.

## 2019-10-30 NOTE — Telephone Encounter (Signed)
-----   Message from Kathrynn Ducking, MD sent at 10/30/2019  7:11 AM EST ----- Blood work is unremarkable with exception of a minimally elevated, persistently elevated chloride level, CO2 is normal.  Albumin level is slightly low, improved from previous evaluation.  Sedimentation rate is normal.  No significant clinical concerns. Please call patient. ----- Message ----- From: Lavone Neri Lab Results In Sent: 10/30/2019   5:37 AM EST To: Kathrynn Ducking, MD

## 2019-11-05 ENCOUNTER — Telehealth: Payer: Self-pay | Admitting: Radiology

## 2019-11-05 NOTE — Telephone Encounter (Signed)
Enrolled patient for a 30 day [employer] event monitor to be mailed. Brief instructions were gone over with patient and she knows to expect the monitor to arrive in 4-5 days.

## 2019-11-11 ENCOUNTER — Other Ambulatory Visit: Payer: Self-pay | Admitting: Gastroenterology

## 2019-11-21 ENCOUNTER — Ambulatory Visit
Admission: RE | Admit: 2019-11-21 | Discharge: 2019-11-21 | Disposition: A | Discharge status: Home / Self Care | Payer: Medicare Other | Source: Ambulatory Visit | Attending: Neurology | Admitting: Neurology

## 2019-11-21 DIAGNOSIS — R55 Syncope and collapse: Secondary | ICD-10-CM

## 2019-11-21 DIAGNOSIS — G4489 Other headache syndrome: Secondary | ICD-10-CM

## 2019-11-24 ENCOUNTER — Telehealth: Payer: Self-pay | Admitting: Neurology

## 2019-11-24 NOTE — Telephone Encounter (Signed)
I called the patient.  The MRI of the brain shows only minimal white matter changes, minimal atrophy, nothing that should be causing headaches or syncope.  An EEG study and 30-day cardiac monitor study are pending.   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

## 2019-11-25 ENCOUNTER — Ambulatory Visit: Payer: Medicare Other | Admitting: Neurology

## 2019-11-25 ENCOUNTER — Other Ambulatory Visit: Payer: Self-pay

## 2019-11-25 DIAGNOSIS — R55 Syncope and collapse: Secondary | ICD-10-CM

## 2019-11-27 ENCOUNTER — Telehealth: Payer: Self-pay | Admitting: Neurology

## 2019-11-27 DIAGNOSIS — R55 Syncope and collapse: Secondary | ICD-10-CM

## 2019-11-27 NOTE — Procedures (Signed)
    History:  Sarah [name] is a 78 year old patient with a history of recent onset of headache and at least one syncopal event.  The patient in the past has had documentation of abnormal EEG evaluation, she was placed on Trileptal in 2014.  She had a blackout event on 08 September 2019 after onset of dizziness.  She continues to have frequent episodes of dizziness.  She is being evaluated for these issues.  This is a routine EEG.  No skull defects are noted.  Medications include Estrace, Flonase, and Synthroid.  EEG classification: Normal awake  Description of the recording: The background rhythms of this recording consists of a fairly well modulated medium amplitude alpha rhythm of 9 Hz that is reactive to eye opening and closure. As the record progresses, the patient appears to remain in the waking state throughout the recording. Photic stimulation was performed, resulting in a bilateral and symmetric photic driving response. Hyperventilation was also performed, resulting in a minimal buildup of the background rhythm activities without significant slowing seen. At no time during the recording does there appear to be evidence of spike or spike wave discharges or evidence of focal slowing. EKG monitor shows no evidence of cardiac rhythm abnormalities with a heart rate of 60.  Impression: This is a normal EEG recording in the waking state. No evidence of ictal or interictal discharges are seen.

## 2019-11-27 NOTE — Telephone Encounter (Signed)
I called the patient.  EEG study was unremarkable.  The patient received a cardiac monitor but could not figure how to put it on, I will try to reorder to have her come into the cardiology office to have someone instruct her how to do it.

## 2019-12-02 ENCOUNTER — Encounter (INDEPENDENT_AMBULATORY_CARE_PROVIDER_SITE_OTHER): Payer: Self-pay

## 2019-12-02 ENCOUNTER — Other Ambulatory Visit: Payer: Self-pay

## 2019-12-02 ENCOUNTER — Ambulatory Visit (INDEPENDENT_AMBULATORY_CARE_PROVIDER_SITE_OTHER): Payer: Medicare Other

## 2019-12-02 DIAGNOSIS — R55 Syncope and collapse: Secondary | ICD-10-CM | POA: Diagnosis not present

## 2019-12-10 ENCOUNTER — Telehealth: Payer: Self-pay | Admitting: Interventional Cardiology

## 2019-12-10 NOTE — Telephone Encounter (Signed)
Patient states she needs new strips for her 30 day monitor.

## 2019-12-11 ENCOUNTER — Encounter: Payer: Self-pay | Admitting: *Deleted

## 2019-12-11 NOTE — Progress Notes (Signed)
Patient ID: Sarah [name], female   DOB: 02-13-1941, 79 y.o.   MRN: [id number] [employer] has been contacted to ship 4 additional strips via UPS to the patients home.

## 2019-12-12 ENCOUNTER — Other Ambulatory Visit: Payer: Self-pay

## 2019-12-12 NOTE — Progress Notes (Signed)
Ms. Rendall is currently wearing a 30 day cardiac monitor.

## 2019-12-17 ENCOUNTER — Other Ambulatory Visit (HOSPITAL_COMMUNITY)
Admission: RE | Admit: 2019-12-17 | Discharge: 2019-12-17 | Disposition: A | Discharge status: Home / Self Care | Payer: Medicare Other | Source: Ambulatory Visit | Attending: Gastroenterology | Admitting: Gastroenterology

## 2019-12-17 DIAGNOSIS — Z01812 Encounter for preprocedural laboratory examination: Secondary | ICD-10-CM | POA: Diagnosis present

## 2019-12-17 DIAGNOSIS — Z20822 Contact with and (suspected) exposure to covid-19: Secondary | ICD-10-CM | POA: Diagnosis not present

## 2019-12-18 NOTE — Telephone Encounter (Signed)
Darrick Penna W had 4 new strips sent to patient on 1-6

## 2019-12-19 ENCOUNTER — Other Ambulatory Visit (HOSPITAL_COMMUNITY)
Admission: RE | Admit: 2019-12-19 | Discharge: 2019-12-19 | Disposition: A | Discharge status: Home / Self Care | Payer: Medicare Other | Source: Ambulatory Visit | Attending: Gastroenterology | Admitting: Gastroenterology

## 2019-12-19 DIAGNOSIS — Z01812 Encounter for preprocedural laboratory examination: Secondary | ICD-10-CM | POA: Insufficient documentation

## 2019-12-19 DIAGNOSIS — Z20822 Contact with and (suspected) exposure to covid-19: Secondary | ICD-10-CM | POA: Insufficient documentation

## 2019-12-19 LAB — SARS CORONAVIRUS 2 (TAT 6-24 HRS): SARS Coronavirus 2: NEGATIVE

## 2019-12-19 LAB — NOVEL CORONAVIRUS, NAA (HOSP ORDER, SEND-OUT TO REF LAB; TAT 18-24 HRS): SARS-CoV-2, NAA: NOT DETECTED

## 2019-12-19 NOTE — Progress Notes (Signed)
Spoke with patient, has quarantined since covid test without symptoms.  Patient made aware covid test is not back.  Spoke with lab and [facility], unsure if will result in time for tomorrow procedure per [facility].  Discussed with Caryl Pina at Dr. Ulyses Amor office,  Advised for patient to go to testing center today by 330pm for rapid test per Dr. Benson Norway.  Patient in agreement.  Spoke with Adrianne at covid testing center, Adrianne is aware of situation, will place needed order and be expecting patient arrival.  Answered patient questions.  Patient arrival time 0600 am tomorrow for colonoscopy

## 2019-12-19 NOTE — Progress Notes (Signed)
Spoke with Eddie from [place], and he sts the covid test result "may" result later on today, or first thing tomorrow morning. Pt opted to get retested to ensure her results will be back before her procedure at 0730.

## 2019-12-20 ENCOUNTER — Encounter (HOSPITAL_COMMUNITY)
Admission: RE | Disposition: A | Discharge status: Home / Self Care | Payer: Self-pay | Source: Home / Self Care | Attending: Gastroenterology

## 2019-12-20 ENCOUNTER — Other Ambulatory Visit: Payer: Self-pay

## 2019-12-20 ENCOUNTER — Ambulatory Visit (HOSPITAL_COMMUNITY): Payer: Medicare Other | Admitting: Anesthesiology

## 2019-12-20 ENCOUNTER — Encounter (HOSPITAL_COMMUNITY): Payer: Self-pay | Admitting: Gastroenterology

## 2019-12-20 ENCOUNTER — Ambulatory Visit (HOSPITAL_COMMUNITY)
Admission: RE | Admit: 2019-12-20 | Discharge: 2019-12-20 | Disposition: A | Discharge status: Home / Self Care | Payer: Medicare Other | Attending: Gastroenterology | Admitting: Gastroenterology

## 2019-12-20 DIAGNOSIS — K5900 Constipation, unspecified: Secondary | ICD-10-CM | POA: Insufficient documentation

## 2019-12-20 DIAGNOSIS — Z820 Family history of epilepsy and other diseases of the nervous system: Secondary | ICD-10-CM | POA: Insufficient documentation

## 2019-12-20 DIAGNOSIS — E039 Hypothyroidism, unspecified: Secondary | ICD-10-CM | POA: Diagnosis not present

## 2019-12-20 DIAGNOSIS — Z96651 Presence of right artificial knee joint: Secondary | ICD-10-CM | POA: Insufficient documentation

## 2019-12-20 DIAGNOSIS — Z6825 Body mass index (BMI) 25.0-25.9, adult: Secondary | ICD-10-CM | POA: Diagnosis not present

## 2019-12-20 DIAGNOSIS — E669 Obesity, unspecified: Secondary | ICD-10-CM | POA: Insufficient documentation

## 2019-12-20 DIAGNOSIS — Z90710 Acquired absence of both cervix and uterus: Secondary | ICD-10-CM | POA: Insufficient documentation

## 2019-12-20 DIAGNOSIS — D649 Anemia, unspecified: Secondary | ICD-10-CM | POA: Diagnosis not present

## 2019-12-20 DIAGNOSIS — I10 Essential (primary) hypertension: Secondary | ICD-10-CM | POA: Diagnosis not present

## 2019-12-20 DIAGNOSIS — Z888 Allergy status to other drugs, medicaments and biological substances status: Secondary | ICD-10-CM | POA: Diagnosis not present

## 2019-12-20 DIAGNOSIS — Z801 Family history of malignant neoplasm of trachea, bronchus and lung: Secondary | ICD-10-CM | POA: Insufficient documentation

## 2019-12-20 DIAGNOSIS — Z880 Allergy status to penicillin: Secondary | ICD-10-CM | POA: Diagnosis not present

## 2019-12-20 DIAGNOSIS — Z1211 Encounter for screening for malignant neoplasm of colon: Secondary | ICD-10-CM | POA: Insufficient documentation

## 2019-12-20 DIAGNOSIS — Z8052 Family history of malignant neoplasm of bladder: Secondary | ICD-10-CM | POA: Diagnosis not present

## 2019-12-20 DIAGNOSIS — Z9841 Cataract extraction status, right eye: Secondary | ICD-10-CM | POA: Diagnosis not present

## 2019-12-20 DIAGNOSIS — R55 Syncope and collapse: Secondary | ICD-10-CM | POA: Diagnosis not present

## 2019-12-20 DIAGNOSIS — R519 Headache, unspecified: Secondary | ICD-10-CM | POA: Diagnosis not present

## 2019-12-20 DIAGNOSIS — Z20822 Contact with and (suspected) exposure to covid-19: Secondary | ICD-10-CM | POA: Diagnosis not present

## 2019-12-20 DIAGNOSIS — Z8249 Family history of ischemic heart disease and other diseases of the circulatory system: Secondary | ICD-10-CM | POA: Insufficient documentation

## 2019-12-20 DIAGNOSIS — M1990 Unspecified osteoarthritis, unspecified site: Secondary | ICD-10-CM | POA: Insufficient documentation

## 2019-12-20 DIAGNOSIS — D122 Benign neoplasm of ascending colon: Secondary | ICD-10-CM | POA: Insufficient documentation

## 2019-12-20 DIAGNOSIS — Z9842 Cataract extraction status, left eye: Secondary | ICD-10-CM | POA: Insufficient documentation

## 2019-12-20 DIAGNOSIS — Z86010 Personal history of colonic polyps: Secondary | ICD-10-CM | POA: Insufficient documentation

## 2019-12-20 HISTORY — PX: COLONOSCOPY WITH PROPOFOL: SHX5780

## 2019-12-20 HISTORY — PX: HEMOSTASIS CLIP PLACEMENT: SHX6857

## 2019-12-20 HISTORY — PX: POLYPECTOMY: SHX5525

## 2019-12-20 HISTORY — PX: SUBMUCOSAL LIFTING INJECTION: SHX6855

## 2019-12-20 SURGERY — COLONOSCOPY WITH PROPOFOL
Anesthesia: Monitor Anesthesia Care

## 2019-12-20 MED ORDER — SPOT INK MARKER SYRINGE KIT
PACK | SUBMUCOSAL | Status: AC
  Filled 2019-12-20: qty 5

## 2019-12-20 MED ORDER — PROPOFOL 10 MG/ML IV BOLUS
INTRAVENOUS | Status: AC
  Filled 2019-12-20: qty 20

## 2019-12-20 MED ORDER — LACTATED RINGERS IV SOLN
INTRAVENOUS | Status: DC
  Administered 2019-12-20: 1000 mL via INTRAVENOUS

## 2019-12-20 MED ORDER — PROPOFOL 500 MG/50ML IV EMUL
INTRAVENOUS | Status: AC
  Filled 2019-12-20: qty 50

## 2019-12-20 MED ORDER — GLUCAGON HCL RDNA (DIAGNOSTIC) 1 MG IJ SOLR
INTRAMUSCULAR | Status: DC | PRN
  Administered 2019-12-20: .5 mg via INTRAVENOUS

## 2019-12-20 MED ORDER — GLUCAGON HCL RDNA (DIAGNOSTIC) 1 MG IJ SOLR
INTRAMUSCULAR | Status: AC
  Filled 2019-12-20: qty 1

## 2019-12-20 MED ORDER — PROPOFOL 500 MG/50ML IV EMUL
INTRAVENOUS | Status: DC | PRN
  Administered 2019-12-20: 30 mg via INTRAVENOUS
  Administered 2019-12-20: 20 mg via INTRAVENOUS

## 2019-12-20 MED ORDER — PROPOFOL 500 MG/50ML IV EMUL
INTRAVENOUS | Status: DC | PRN
  Administered 2019-12-20: 125 ug/kg/min via INTRAVENOUS

## 2019-12-20 MED ORDER — SODIUM CHLORIDE 0.9 % IV SOLN: INTRAVENOUS | Status: DC

## 2019-12-20 SURGICAL SUPPLY — 22 items

## 2019-12-20 NOTE — Transfer of Care (Signed)
Immediate Anesthesia Transfer of Care Note  Patient: Sarah [name]  Procedure(s) Performed: COLONOSCOPY WITH PROPOFOL (N/A ) POLYPECTOMY SUBMUCOSAL LIFTING INJECTION  Patient Location: PACU  Anesthesia Type:MAC  Level of Consciousness: drowsy, patient cooperative and responds to stimulation  Airway & Oxygen Therapy: Patient Spontanous Breathing and Patient connected to face mask oxygen  Post-op Assessment: Report given to RN and Post -op Vital signs reviewed and stable  Post vital signs: Reviewed and stable  Last Vitals:  Vitals Value Taken Time  BP 114/70 12/20/19 0844  Temp    Pulse 61 12/20/19 0846  Resp 11 12/20/19 0846  SpO2 100 % 12/20/19 0846  Vitals shown include unvalidated device data.  Last Pain:  Vitals:   12/20/19 0645  TempSrc: Oral  PainSc: 0-No pain         Complications: No apparent anesthesia complications

## 2019-12-20 NOTE — Op Note (Addendum)
[facility] Patient Name: Sarah [name] Procedure Date: 12/20/2019 MRN: [id number] Attending MD: Carol Ada , MD Date of Birth: 21-Nov-1941 CSN: [id number] Age: 79 Admit Type: Outpatient Procedure:                Colonoscopy Indications:              High risk colon cancer surveillance: Personal                            history of colonic polyps Providers:                Carol Ada, MD, Carmie End, RN, Corie Chiquito,                            Technician, Lina Sar, Technician, Rosario Adie, CRNA Referring MD:              Medicines:                Propofol per Anesthesia Complications:            No immediate complications. Estimated Blood Loss:     Estimated blood loss: none. Procedure:                Pre-Anesthesia Assessment:                           - Prior to the procedure, a History and Physical                            was performed, and patient medications and                            allergies were reviewed. The patient's tolerance of                            previous anesthesia was also reviewed. The risks                            and benefits of the procedure and the sedation                            options and risks were discussed with the patient.                            All questions were answered, and informed consent                            was obtained. Prior Anticoagulants: The patient has                            taken no previous anticoagulant or antiplatelet                            agents. ASA Grade Assessment: II -  A patient with                            mild systemic disease. After reviewing the risks                            and benefits, the patient was deemed in                            satisfactory condition to undergo the procedure.                           - Sedation was administered by an anesthesia                            professional. Deep sedation was  attained.                           After obtaining informed consent, the colonoscope                            was passed under direct vision. Throughout the                            procedure, the patient's blood pressure, pulse, and                            oxygen saturations were monitored continuously. The                            [id number]) Olympus colonoscope was                            introduced through the anus and advanced to the the                            cecum, identified by appendiceal orifice and                            ileocecal valve. The colonoscopy was performed                            without difficulty. The patient tolerated the                            procedure well. The quality of the bowel                            preparation was adequate to identify polyps. The                            ileocecal valve, appendiceal orifice, and rectum                            were photographed.  Scope In: 7:47:27 AM Scope Out: 8:36:06 AM Total Procedure Duration: 0 hours 48 minutes 39 seconds  Findings:      A 25 mm polyp was found in the ascending colon. The polyp was sessile.       The polyp was removed with a hot snare. The polyp was removed with a       saline injection-lift technique using a hot snare. The polyp was removed       with a piecemeal technique using a hot snare. Resection and retrieval       were complete.      A very large 2.5 cm sessile ascending colon polyp was identified.       Multiple injections of Eleview was injected to lift the lesion. The       polyp was difficult to removed as it was very sessile. After multiple       passes with the hot snare, the polyp was completely resected. A total of       6 Duraclips were deployed to seal the area. Impression:               - One 25 mm polyp in the ascending colon, removed                            with a hot snare, removed using injection-lift and                             a hot snare and removed piecemeal using a hot                            snare. Resected and retrieved. Moderate Sedation:      Not Applicable - Patient had care per Anesthesia. Recommendation:           - Patient has a contact number available for                            emergencies. The signs and symptoms of potential                            delayed complications were discussed with the                            patient. Return to normal activities tomorrow.                            Written discharge instructions were provided to the                            patient.                           - Resume previous diet.                           - Continue present medications.                           - Await pathology results.                           -  Repeat colonoscopy in 6 months for surveillance. Procedure Code(s):        --- Professional ---                           [phone], Colonoscopy, flexible; with removal of                            tumor(s), polyp(s), or other lesion(s) by snare                            technique                           [id number], Colonoscopy, flexible; with directed                            submucosal injection(s), any substance Diagnosis Code(s):        --- Professional ---                           K63.5, Polyp of colon                           Z86.010, Personal history of colonic polyps CPT copyright 2019 [employer]. All rights reserved. The codes documented in this report are preliminary and upon coder review may  be revised to meet current compliance requirements. Carol Ada, MD Carol Ada, MD 12/20/2019 8:53:38 AM This report has been signed electronically. Number of Addenda: 0

## 2019-12-20 NOTE — Anesthesia Postprocedure Evaluation (Signed)
Anesthesia Post Note  Patient: Sarah [name]  Procedure(s) Performed: COLONOSCOPY WITH PROPOFOL (N/A ) POLYPECTOMY SUBMUCOSAL LIFTING INJECTION     Patient location during evaluation: PACU Anesthesia Type: MAC Level of consciousness: awake and alert Pain management: pain level controlled Vital Signs Assessment: post-procedure vital signs reviewed and stable Respiratory status: spontaneous breathing, nonlabored ventilation, respiratory function stable and patient connected to nasal cannula oxygen Cardiovascular status: stable and blood pressure returned to baseline Postop Assessment: no apparent nausea or vomiting Anesthetic complications: no    Last Vitals:  Vitals:   12/20/19 0855 12/20/19 0900  BP:  128/65  Pulse: (!) 51 61  Resp: 14 17  Temp:    SpO2: 100% 100%    Last Pain:  Vitals:   12/20/19 0900  TempSrc:   PainSc: 0-No pain                 Preslie Depasquale S

## 2019-12-20 NOTE — Discharge Instructions (Signed)

## 2019-12-20 NOTE — Anesthesia Preprocedure Evaluation (Addendum)
Anesthesia Evaluation  Patient identified by MRN, date of birth, ID band Patient awake    Reviewed: Allergy & Precautions, NPO status , Patient's Chart, lab work & pertinent test results  Airway Mallampati: II  TM Distance: >3 FB Neck ROM: Full    Dental no notable dental hx.    Pulmonary neg pulmonary ROS,    Pulmonary exam normal breath sounds clear to auscultation       Cardiovascular hypertension, Normal cardiovascular exam Rhythm:Regular Rate:Normal     Neuro/Psych negative neurological ROS  negative psych ROS   GI/Hepatic negative GI ROS, Neg liver ROS,   Endo/Other  Hypothyroidism   Renal/GU negative Renal ROS  negative genitourinary   Musculoskeletal negative musculoskeletal ROS (+)   Abdominal   Peds negative pediatric ROS (+)  Hematology negative hematology ROS (+)   Anesthesia Other Findings   Reproductive/Obstetrics negative OB ROS                            Anesthesia Physical Anesthesia Plan  ASA: II  Anesthesia Plan: MAC   Post-op Pain Management:    Induction: Intravenous  PONV Risk Score and Plan: 0  Airway Management Planned: Simple Face Mask  Additional Equipment:   Intra-op Plan:   Post-operative Plan:   Informed Consent: I have reviewed the patients History and Physical, chart, labs and discussed the procedure including the risks, benefits and alternatives for the proposed anesthesia with the patient or authorized representative who has indicated his/her understanding and acceptance.   Dental advisory given  Plan Discussed with: CRNA and Surgeon  Anesthesia Plan Comments:         Anesthesia Quick Evaluation  

## 2019-12-20 NOTE — H&P (Signed)
Sarah [name] HPI: Approximately 1.5 weeks ago she started having constipation and then it converted over to diarrhea. There was an associated mild elevation in her temp to 99 F. She tested negative for COVID. Her PCP concluded that she had diverticulitis and she was treated with Cipro and Flagyl. Her diarrhea resolved and now she started to have problems with constipation. The stools are hard and difficult to pass. He colonoscopy on 01/29/2019 was positive for multiple adenomas. A large 2.5 cm ascending colon polyp was identified and the plan was to remove the polyp in the hospital, however, COVID-19 resulted in the cancellation of the procedure. She now complains about left sided abdominal and left sided back pain.   Past Medical History:  Diagnosis Date  . Anemia   . Degenerative arthritis   . Headache(784.0)    sinus  . Hypertension   . Hypothyroidism   . Obesity   . Syncope, near 1 year ago    Past Surgical History:  Procedure Laterality Date  . ABDOMINAL HYSTERECTOMY     partial  . arthroscopic knee surgery  July 03, 2013  . CARDIAC CATHETERIZATION N/A 09/26/2016   Procedure: Left Heart Cath and Coronary Angiography;  Surgeon: Jettie Booze, MD;  Location: [facility];  Service: Cardiovascular;  Laterality: N/A;  . CARPAL TUNNEL RELEASE Bilateral   . CATARACT EXTRACTION, BILATERAL    . CERVICAL SPINE SURGERY    . cyst removed from eyebrow  4-5 yrs ago  . LUMBAR SPINE SURGERY    . TONSILLECTOMY  age 35 or 19  . TOTAL KNEE ARTHROPLASTY Right 12/18/2013   Procedure: RIGHT TOTAL KNEE ARTHROPLASTY;  Surgeon: Tobi Bastos, MD;  Location: [facility];  Service: Orthopedics;  Laterality: Right;    Family History  Problem Relation Age of Onset  . Alzheimer's disease Sister   . Cancer - Lung Sister   . Heart disease Brother   . Heart disease Brother   . Heart disease Brother   . Cancer Brother        Bladder cancer    Social History:  reports that she has  never smoked. She has never used smokeless tobacco. She reports that she does not drink alcohol or use drugs.  Allergies:  Allergies  Allergen Reactions  . Adhesive [Tape] Swelling    redness  . Atorvastatin Other (See Comments)    Fatigue, leg cramps  . Other     Band-Aid cause patient to swell  . Oxycodone-Acetaminophen Nausea And Vomiting  . Penicillins Hives    Did it involve swelling of the face/tongue/throat, SOB, or low BP? No Did it involve sudden or severe rash/hives, skin peeling, or any reaction on the inside of your mouth or nose? Yes Did you need to seek medical attention at a hospital or doctor's office? Yes When did it last happen?10+ years If all above answers are "NO", may proceed with cephalosporin use.   . Voltaren [Diclofenac Sodium] Hives and Rash    Medications:  Scheduled:  Continuous: . sodium chloride    . lactated ringers 1,000 mL (12/20/19 [id number])    Results for orders placed or performed during the hospital encounter of 12/19/19 (from the past 24 hour(s))  SARS CORONAVIRUS 2 (TAT 6-24 HRS) Nasopharyngeal Nasopharyngeal Swab     Status: None   Collection Time: 12/19/19  3:01 PM   Specimen: Nasopharyngeal Swab  Result Value Ref Range   SARS Coronavirus 2 NEGATIVE NEGATIVE     No results  found.  ROS:  As stated above in the HPI otherwise negative.  Blood pressure (!) 141/67, pulse 60, temperature 97.9 F (36.6 C), temperature source Oral, resp. rate 16, height 5\' 7"  (1.702 m), weight 74.8 kg, SpO2 100 %.    PE: Gen: NAD, Alert and Oriented HEENT:  [place]/AT, EOMI Neck: Supple, no LAD Lungs: CTA Bilaterally CV: RRR without M/G/R ABM: Soft, NTND, +BS Ext: No C/C/E  Assessment/Plan: 1) Ascending colon polyp - colonoscopy for resection.  Edwards Mckelvie D 12/20/2019, 8:38 AM

## 2019-12-20 NOTE — Anesthesia Procedure Notes (Signed)
Date/Time: 12/20/2019 7:37 AM Performed by: Glory Buff, CRNA Oxygen Delivery Method: Simple face mask

## 2019-12-23 ENCOUNTER — Encounter: Payer: Self-pay | Admitting: *Deleted

## 2019-12-23 LAB — SURGICAL PATHOLOGY

## 2019-12-24 ENCOUNTER — Ambulatory Visit: Payer: Medicare Other | Admitting: Neurology

## 2020-01-07 ENCOUNTER — Telehealth: Payer: Self-pay | Admitting: Neurology

## 2020-01-07 MED ORDER — LEVETIRACETAM 250 MG PO TABS: ORAL_TABLET | ORAL | 3 refills | Status: DC

## 2020-01-07 NOTE — Telephone Encounter (Signed)
I called the patient.  The cardiac monitor study was unremarkable.  The patient reports that she did have a brief episode while being monitored, she was eating lunch and then suddenly started leaning to the left uncontrollably, she believes that she may have blacked out briefly.  The cardiac monitor does not record any significant cardiac abnormalities around this time.  The patient has had abnormal EEG evaluation in the past, we will give her another trial on an anticonvulsant medication, start low-dose Keppra.  I will send in a prescription.    Cardiac monitor study 01/07/20:  Narrative & Impression     Normal sinus rhythm with rare PACs and PVCs.  No pathologic arrhythmias.  No atrial fibrillation.

## 2020-03-20 ENCOUNTER — Telehealth: Payer: Self-pay | Admitting: Neurology

## 2020-03-20 MED ORDER — LEVETIRACETAM 250 MG PO TABS: 250.0000 mg | ORAL_TABLET | Freq: Two times a day (BID) | ORAL | 3 refills | Status: DC

## 2020-03-20 NOTE — Telephone Encounter (Signed)
Pt is asking for a call to discuss that levETIRAcetam (KEPPRA) 250 MG tablet is making her feel nervous and very shaky.  Pt is asking for a call as soon as possible.

## 2020-03-20 NOTE — Telephone Encounter (Signed)
I called the patient.  On Keppra 250 mg twice daily she tolerated the drug well but when she went to 500 mg twice daily she has begun to have problems with anxiety and jitteriness.  She will cut back on the dose taking 1 tablet twice daily.

## 2020-03-20 NOTE — Addendum Note (Signed)
Addended by: Kathrynn Ducking on: 03/20/2020 11:50 AM   Modules accepted: Orders

## 2020-04-01 ENCOUNTER — Other Ambulatory Visit: Payer: Self-pay

## 2020-04-01 ENCOUNTER — Telehealth: Payer: Self-pay | Admitting: Neurology

## 2020-04-01 ENCOUNTER — Other Ambulatory Visit: Payer: Self-pay | Admitting: Neurology

## 2020-04-01 ENCOUNTER — Ambulatory Visit: Payer: Medicare Other | Admitting: Neurology

## 2020-04-01 ENCOUNTER — Encounter: Payer: Self-pay | Admitting: Neurology

## 2020-04-01 VITALS — BP 107/59 | HR 57 | Temp 96.8°F | Ht 67.0 in | Wt 171.0 lb

## 2020-04-01 DIAGNOSIS — R55 Syncope and collapse: Secondary | ICD-10-CM

## 2020-04-01 DIAGNOSIS — G4489 Other headache syndrome: Secondary | ICD-10-CM | POA: Insufficient documentation

## 2020-04-01 MED ORDER — OXCARBAZEPINE 150 MG PO TABS
150.0000 mg | ORAL_TABLET | Freq: Two times a day (BID) | ORAL | 3 refills | Status: DC
Start: 2020-04-01 — End: 2020-07-07

## 2020-04-01 NOTE — Telephone Encounter (Signed)
[employer] medicare order sent to [facility]. No auth they will reach out to the patient to schedule.  

## 2020-04-01 NOTE — Progress Notes (Signed)
Reason for visit: Headache, syncope, near syncope  Sarah [name] is an 79 y.o. female  History of present illness:  Sarah [name] is a 79 year old right-handed white female with a history of recurring episodes of syncope.  The patient was seen initially in 2013 and 2014, she was placed on Trileptal and seemed to do well with this, but she went off the medication.  The patient began having recurring events of syncope while standing or sitting, she also reports episodes of near syncope associated with being upset with something, she may have tremors of the arms with these events.  The last such event was 4 days ago.  The patient initially was placed on Topamax for the headache and for the blackouts but she could not tolerate the drug and went off this and was placed on Keppra.  She could not tolerate the Keppra either due to drowsiness and nervousness.  The patient in the past has had an abnormal EEG but a more recent EEG study was normal.  MRI of the brain was done did not show any acute changes.  The patient has had a 30-day cardiac monitor, she had a blackout episode during the study but no cardiac rhythm abnormalities were noted.  She has not had an evaluation of her cerebrovascular circulation.  She claims that she is getting headache about 2 times a week at this point.  She has chronic insomnia.  Past Medical History:  Diagnosis Date  . Anemia   . Degenerative arthritis   . Headache(784.0)    sinus  . Hypertension   . Hypothyroidism   . Obesity   . Syncope, near 1 year ago    Past Surgical History:  Procedure Laterality Date  . ABDOMINAL HYSTERECTOMY     partial  . arthroscopic knee surgery  July 03, 2013  . CARDIAC CATHETERIZATION N/A 09/26/2016   Procedure: Left Heart Cath and Coronary Angiography;  Surgeon: Jettie Booze, MD;  Location: [place] [facility];  Service: Cardiovascular;  Laterality: N/A;  . CARPAL TUNNEL RELEASE Bilateral   . CATARACT EXTRACTION, BILATERAL     . CERVICAL SPINE SURGERY    . COLONOSCOPY WITH PROPOFOL N/A 12/20/2019   Procedure: COLONOSCOPY WITH PROPOFOL;  Surgeon: Carol Ada, MD;  Location: [facility];  Service: Endoscopy;  Laterality: N/A;  . cyst removed from eyebrow  4-5 yrs ago  . HEMOSTASIS CLIP PLACEMENT  12/20/2019   Procedure: HEMOSTASIS CLIP PLACEMENT;  Surgeon: Carol Ada, MD;  Location: [facility];  Service: Endoscopy;;  . LUMBAR SPINE SURGERY    . POLYPECTOMY  12/20/2019   Procedure: POLYPECTOMY;  Surgeon: Carol Ada, MD;  Location: [facility];  Service: Endoscopy;;  . SUBMUCOSAL LIFTING INJECTION  12/20/2019   Procedure: SUBMUCOSAL LIFTING INJECTION;  Surgeon: Carol Ada, MD;  Location: [facility];  Service: Endoscopy;;  . TONSILLECTOMY  age 56 or 9  . TOTAL KNEE ARTHROPLASTY Right 12/18/2013   Procedure: RIGHT TOTAL KNEE ARTHROPLASTY;  Surgeon: Tobi Bastos, MD;  Location: [facility];  Service: Orthopedics;  Laterality: Right;    Family History  Problem Relation Age of Onset  . Alzheimer's disease Sister   . Cancer - Lung Sister   . Heart disease Brother   . Heart disease Brother   . Heart disease Brother   . Cancer Brother        Bladder cancer    Social history:  reports that she has never smoked. She has never used smokeless tobacco. She reports that  she does not drink alcohol or use drugs.    Allergies  Allergen Reactions  . Adhesive [Tape] Swelling    redness  . Atorvastatin Other (See Comments)    Fatigue, leg cramps  . Keppra [Levetiracetam]     jitteriness  . Other     Band-Aid cause patient to swell  . Oxycodone-Acetaminophen Nausea And Vomiting  . Penicillins Hives    Did it involve swelling of the face/tongue/throat, SOB, or low BP? No Did it involve sudden or severe rash/hives, skin peeling, or any reaction on the inside of your mouth or nose? Yes Did you need to seek medical attention at a hospital or doctor's office? Yes When did it last happen?10+ years If  all above answers are "NO", may proceed with cephalosporin use.   . Voltaren [Diclofenac Sodium] Hives and Rash    Medications:  Prior to Admission medications   Medication Sig Start Date End Date Taking? Authorizing Provider  acetaminophen (TYLENOL) 500 MG tablet Take 500-1,000 mg by mouth every 6 (six) hours as needed for moderate pain or headache.   Yes [provider]  b complex vitamins tablet Take 1 tablet by mouth daily.   Yes [provider]  Calcium Citrate-Vitamin D (CALCIUM + D PO) Take 1 tablet by mouth daily.   Yes [provider]  Carboxymethylcellul-Glycerin (LUBRICATING EYE DROPS OP) Place 1 drop into both eyes daily as needed (dry eyes).   Yes [provider]  Cyanocobalamin (B-12) 2500 MCG TABS Take 2,500 mcg by mouth daily.   Yes [provider]  estradiol (ESTRACE) 1 MG tablet Take 0.5 mg by mouth daily with breakfast.  05/07/13  Yes [provider]  ferrous sulfate 325 (65 FE) MG tablet Take 325 mg by mouth daily.   Yes [provider]  fluticasone (FLONASE) 50 MCG/ACT nasal spray Place 1 spray into both nostrils as needed for allergies.    Yes [provider]  levocetirizine (XYZAL) 5 MG tablet Take 5 mg by mouth every evening.   Yes [provider]  levothyroxine (SYNTHROID, LEVOTHROID) 75 MCG tablet Take 75 mcg by mouth every morning.  04/08/13  Yes [provider]  levETIRAcetam (KEPPRA) 250 MG tablet Take 1 tablet (250 mg total) by mouth 2 (two) times daily. Patient not taking: Reported on 04/01/2020 03/20/20   Kathrynn Ducking, MD    ROS:  Out of a complete 14 system review of symptoms, the patient complains only of the following symptoms, and all other reviewed systems are negative.  Chronic insomnia Headache Anxiety Blackouts  Blood pressure (!) 107/59, pulse (!) 57, temperature (!) 96.8 F (36 C), height 5\' 7"  (1.702 m), weight 171 lb (77.6 kg).  Physical  Exam  General: The patient is alert and cooperative at the time of the examination.  Skin: No significant peripheral edema is noted.   Neurologic Exam  Mental status: The patient is alert and oriented x 3 at the time of the examination. The patient has apparent normal recent and remote memory, with an apparently normal attention span and concentration ability.   Cranial nerves: Facial symmetry is present. Speech is normal, no aphasia or dysarthria is noted. Extraocular movements are full. Visual fields are full.  Motor: The patient has good strength in all 4 extremities.  Sensory examination: Soft touch sensation is symmetric on the face, arms, and legs.  Coordination: The patient has good finger-nose-finger and heel-to-shin bilaterally.  Gait and station: The patient has a normal gait.  Tandem gait is slightly unsteady. Romberg is negative. No drift is seen.  Reflexes: Deep tendon reflexes are symmetric.   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  * MRI scan images were reviewed online. I agree with the written report.    Assessment/Plan:  1.  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  Headache  The patient will be taken off of the [place] and placed on Trileptal which he seemed to tolerate previously.  The patient be sent for CT angiogram of the head and neck.  She will follow-up here in 5 or 6 months.  She will call for any dose adjustments of the medication.  Jill Alexanders MD 04/01/2020 8:11 AM  [facility] [address] [place], [place] [address]  Phone [phone] Fax [phone]

## 2020-04-01 NOTE — Patient Instructions (Signed)
We will start Trileptal for the blackouts and headache.  Trileptal (oxcarbazepine) is a seizure medication that is sometimes also used for nerve related pain. As with any seizure medication, this drug may worsen depression. Occasionally, there may be other side effects that include low sodium levels, drowsiness, incoordination, dizziness, headache, nausea, double vision, or a potential allergy involving a skin rash. If you believe that you are having side effects on this medication, please contact our office.

## 2020-04-17 ENCOUNTER — Ambulatory Visit
Admission: RE | Admit: 2020-04-17 | Discharge: 2020-04-17 | Disposition: A | Discharge status: Home / Self Care | Payer: Medicare Other | Source: Ambulatory Visit | Attending: Neurology | Admitting: Neurology

## 2020-04-17 DIAGNOSIS — G4489 Other headache syndrome: Secondary | ICD-10-CM

## 2020-04-17 DIAGNOSIS — R55 Syncope and collapse: Secondary | ICD-10-CM

## 2020-04-17 MED ORDER — IOPAMIDOL (ISOVUE-370) INJECTION 76%
75.0000 mL | Freq: Once | INTRAVENOUS | Status: AC | PRN
  Administered 2020-04-17: 75 mL via INTRAVENOUS

## 2020-04-21 ENCOUNTER — Telehealth: Payer: Self-pay | Admitting: Neurology

## 2020-04-21 NOTE — Telephone Encounter (Signed)
I called the patient, CT angiogram of the head and neck was relatively unremarkable, no evidence of circulation problems that resulted in blackouts, she has already had a 30-day cardiac monitor study.  She is on Trileptal, she will remain on this medication.  CTA head and neck 04/17/20:  IMPRESSION: 1. Minimal atherosclerosis without large vessel occlusion or significant stenosis in the head and neck. 2. Unremarkable CT appearance of the brain. 3.  Aortic Atherosclerosis (ICD10-I70.0).

## 2020-07-07 ENCOUNTER — Other Ambulatory Visit: Payer: Self-pay | Admitting: *Deleted

## 2020-07-07 MED ORDER — OXCARBAZEPINE 150 MG PO TABS: 150.0000 mg | ORAL_TABLET | Freq: Two times a day (BID) | ORAL | 1 refills | Status: DC

## 2020-07-07 NOTE — Telephone Encounter (Signed)
Received Rx request for Oxcarbazepine 150 mg tablet. Per last note, continue per Dr. Jannifer Franklin. Pt has pending appt w/ Judson Roch NP on 10/01/20.

## 2020-10-01 ENCOUNTER — Ambulatory Visit: Payer: Medicare Other | Admitting: Neurology

## 2020-10-07 ENCOUNTER — Telehealth: Payer: Self-pay | Admitting: Neurology

## 2020-10-07 NOTE — Telephone Encounter (Signed)
Called patient and let her know that she still has 1 refill left for a 90 day supply.  Patient stated she didn't look at that and expressed appreciation.

## 2020-10-07 NOTE — Telephone Encounter (Signed)
Pt called stating she is needing a refill on her OXcarbazepine (TRILEPTAL) 150 MG tablet sent in to the [facility] on [address]

## 2021-01-06 ENCOUNTER — Encounter: Payer: Self-pay | Admitting: Neurology

## 2021-01-06 ENCOUNTER — Ambulatory Visit: Payer: Medicare Other | Admitting: Neurology

## 2021-01-06 VITALS — BP 126/68 | HR 70 | Ht 67.0 in | Wt 165.0 lb

## 2021-01-06 DIAGNOSIS — R55 Syncope and collapse: Secondary | ICD-10-CM | POA: Diagnosis not present

## 2021-01-06 MED ORDER — OXCARBAZEPINE 150 MG PO TABS: 150.0000 mg | ORAL_TABLET | Freq: Two times a day (BID) | ORAL | 1 refills | Status: DC

## 2021-01-06 NOTE — Progress Notes (Signed)
PATIENT: Sarah [name] DOB: Sep 14, 1941  REASON FOR VISIT: follow up HISTORY FROM: patient  HISTORY OF PRESENT ILLNESS: Today 01/06/21 Sarah [name] is a 80 year old female history of recurring episodes of syncope.  She has previously been on Trileptal, Topamax, and Keppra, could not tolerate or had side effect.  In the past has had an abnormal EEG, but recent EEG study was normal.  Cardiac monitor study showed a blackout episode but no cardiac rhythm abnormalities were noted.  CT angiogram of the head and neck was relatively unremarkable, no evidence of circulation problems that resulted in blackouts.  She is now back on Trileptal 150 mg BID since April 2021.  In June and August, she had a near syncopal spell, but did not pass out, it went away quickly.  She was sitting down when the events occurred.  This is much improvement.  She has been under a lot of stress, her husband is blind, has skin cancer.  Will be undergoing radiation to the head.  Seems to tolerate the Trileptal well.  Here today for evaluation unaccompanied.  HISTORY  04/01/2020 Dr. Jannifer Franklin: Sarah [name] is a 80 year old right-handed white female with a history of recurring episodes of syncope.  The patient was seen initially in 2013 and 2014, she was placed on Trileptal and seemed to do well with this, but she went off the medication.  The patient began having recurring events of syncope while standing or sitting, she also reports episodes of near syncope associated with being upset with something, she may have tremors of the arms with these events.  The last such event was 4 days ago.  The patient initially was placed on Topamax for the headache and for the blackouts but she could not tolerate the drug and went off this and was placed on Keppra.  She could not tolerate the Keppra either due to drowsiness and nervousness.  The patient in the past has had an abnormal EEG but a more recent EEG study was normal.  MRI of the brain was done  did not show any acute changes.  The patient has had a 30-day cardiac monitor, she had a blackout episode during the study but no cardiac rhythm abnormalities were noted.  She has not had an evaluation of her cerebrovascular circulation.  She claims that she is getting headache about 2 times a week at this point.  She has chronic insomnia.  REVIEW OF SYSTEMS: Out of a complete 14 system review of symptoms, the patient complains only of the following symptoms, and all other reviewed systems are negative.  n/a  ALLERGIES: Allergies  Allergen Reactions  . Adhesive [Tape] Swelling    redness  . Atorvastatin Other (See Comments)    Fatigue, leg cramps  . Keppra [Levetiracetam]     jitteriness  . Other     Band-Aid cause patient to swell  . Oxycodone-Acetaminophen Nausea And Vomiting  . Penicillins Hives    Did it involve swelling of the face/tongue/throat, SOB, or low BP? No Did it involve sudden or severe rash/hives, skin peeling, or any reaction on the inside of your mouth or nose? Yes Did you need to seek medical attention at a hospital or doctor's office? Yes When did it last happen?10+ years If all above answers are "NO", may proceed with cephalosporin use.   . Voltaren [Diclofenac Sodium] Hives and Rash    HOME MEDICATIONS: Outpatient Medications Prior to Visit  Medication Sig Dispense Refill  . acetaminophen (TYLENOL) 500 MG  tablet Take 500-1,000 mg by mouth every 6 (six) hours as needed for moderate pain or headache.    . b complex vitamins tablet Take 1 tablet by mouth daily.    . Calcium Citrate-Vitamin D (CALCIUM + D PO) Take 1 tablet by mouth daily.    . Carboxymethylcellul-Glycerin (LUBRICATING EYE DROPS OP) Place 1 drop into both eyes daily as needed (dry eyes).    . Cyanocobalamin (B-12) 2500 MCG TABS Take 2,500 mcg by mouth daily.    Marland Kitchen estradiol (ESTRACE) 1 MG tablet Take 0.5 mg by mouth daily with breakfast.     . ferrous sulfate 325 (65 FE) MG tablet Take 325  mg by mouth daily.    . fluticasone (FLONASE) 50 MCG/ACT nasal spray Place 1 spray into both nostrils as needed for allergies.     Marland Kitchen levocetirizine (XYZAL) 5 MG tablet Take 5 mg by mouth every evening.    Marland Kitchen levothyroxine (SYNTHROID, LEVOTHROID) 75 MCG tablet Take 75 mcg by mouth every morning.     . OXcarbazepine (TRILEPTAL) 150 MG tablet Take 1 tablet (150 mg total) by mouth 2 (two) times daily. 180 tablet 1   No facility-administered medications prior to visit.    PAST MEDICAL HISTORY: Past Medical History:  Diagnosis Date  . Anemia   . Degenerative arthritis   . Headache(784.0)    sinus  . Hypertension   . Hypothyroidism   . Obesity   . Syncope, near 1 year ago    PAST SURGICAL HISTORY: Past Surgical History:  Procedure Laterality Date  . ABDOMINAL HYSTERECTOMY     partial  . arthroscopic knee surgery  July 03, 2013  . CARDIAC CATHETERIZATION N/A 09/26/2016   Procedure: Left Heart Cath and Coronary Angiography;  Surgeon: Jettie Booze, MD;  Location: [place] [facility];  Service: Cardiovascular;  Laterality: N/A;  . CARPAL TUNNEL RELEASE Bilateral   . CATARACT EXTRACTION, BILATERAL    . CERVICAL SPINE SURGERY    . COLONOSCOPY WITH PROPOFOL N/A 12/20/2019   Procedure: COLONOSCOPY WITH PROPOFOL;  Surgeon: Carol Ada, MD;  Location: [facility];  Service: Endoscopy;  Laterality: N/A;  . cyst removed from eyebrow  4-5 yrs ago  . HEMOSTASIS CLIP PLACEMENT  12/20/2019   Procedure: HEMOSTASIS CLIP PLACEMENT;  Surgeon: Carol Ada, MD;  Location: [facility];  Service: Endoscopy;;  . LUMBAR SPINE SURGERY    . POLYPECTOMY  12/20/2019   Procedure: POLYPECTOMY;  Surgeon: Carol Ada, MD;  Location: [facility];  Service: Endoscopy;;  . SUBMUCOSAL LIFTING INJECTION  12/20/2019   Procedure: SUBMUCOSAL LIFTING INJECTION;  Surgeon: Carol Ada, MD;  Location: [facility];  Service: Endoscopy;;  . TONSILLECTOMY  age 67 or 33  . TOTAL KNEE ARTHROPLASTY Right 12/18/2013    Procedure: RIGHT TOTAL KNEE ARTHROPLASTY;  Surgeon: Tobi Bastos, MD;  Location: [facility];  Service: Orthopedics;  Laterality: Right;    FAMILY HISTORY: Family History  Problem Relation Age of Onset  . Alzheimer's disease Sister   . Cancer - Lung Sister   . Heart disease Brother   . Heart disease Brother   . Heart disease Brother   . Cancer Brother        Bladder cancer    SOCIAL HISTORY: Social History   Socioeconomic History  . Marital status: Married    Spouse name: Not on file  . Number of children: 3  . Years of education: 104  . Highest education level: Not on file  Occupational History  . Not  on file  Tobacco Use  . Smoking status: Never Smoker  . Smokeless tobacco: Never Used  Substance and Sexual Activity  . Alcohol use: No  . Drug use: No  . Sexual activity: Not on file  Other Topics Concern  . Not on file  Social History Narrative  . Not on file   Social Determinants of Health   Financial Resource Strain: Not on file  Food Insecurity: Not on file  Transportation Needs: Not on file  Physical Activity: Not on file  Stress: Not on file  Social Connections: Not on file  Intimate Partner Violence: Not on file   PHYSICAL EXAM  Vitals:   01/06/21 1413  BP: 126/68  Pulse: 70  Weight: 165 lb (74.8 kg)  Height: 5\' 7"  (1.702 m)   Body mass index is 25.84 kg/m.  Generalized: Well developed, in no acute distress   Neurological examination  Mentation: Alert oriented to time, place, history taking. Follows all commands speech and language fluent Cranial nerve II-XII: Pupils were equal round reactive to light. Extraocular movements were full, visual field were full on confrontational test. Facial sensation and strength were normal.  Head turning and shoulder shrug  were normal and symmetric. Motor: The motor testing reveals 5 over 5 strength of all 4 extremities. Good symmetric motor tone is noted throughout.  Sensory: Sensory testing is intact to soft  touch on all 4 extremities. No evidence of extinction is noted.  Coordination: Cerebellar testing reveals good finger-nose-finger and heel-to-shin bilaterally.  Gait and station: Gait is normal. Tandem gait is slightly unsteady.  Romberg is negative. No drift is seen.  Reflexes: Deep tendon reflexes are symmetric and normal bilaterally.   DIAGNOSTIC DATA (LABS, IMAGING, TESTING) - I reviewed patient records, labs, notes, testing and imaging myself where available.  Lab Results  Component Value Date   WBC 5.9 09/26/2016   HGB 12.6 09/26/2016   HCT 37.6 09/26/2016   MCV 90.0 09/26/2016   PLT 171 09/26/2016      Component Value Date/Time   NA 143 10/29/2019 0830   K 4.4 10/29/2019 0830   CL 108 (H) 10/29/2019 0830   CO2 22 10/29/2019 0830   GLUCOSE 89 10/29/2019 0830   GLUCOSE 94 09/26/2016 0444   BUN 10 10/29/2019 0830   CREATININE 0.64 10/29/2019 0830   CALCIUM 8.9 10/29/2019 0830   PROT 6.0 10/29/2019 0830   ALBUMIN 3.6 (L) 10/29/2019 0830   AST 16 10/29/2019 0830   ALT 9 10/29/2019 0830   ALKPHOS 64 10/29/2019 0830   BILITOT 0.5 10/29/2019 0830   GFRNONAA 86 10/29/2019 0830   GFRAA 100 10/29/2019 0830   Lab Results  Component Value Date   CHOL 166 09/24/2016   HDL 54 09/24/2016   LDLCALC 103 (H) 09/24/2016   TRIG 46 09/24/2016   CHOLHDL 3.1 09/24/2016   Lab Results  Component Value Date   HGBA1C 5.3 09/24/2016   No results found for: [id number] Lab Results  Component Value Date   TSH 1.205 09/24/2016   ASSESSMENT AND PLAN 80 y.o. year old female  has a past medical history of Anemia, Degenerative arthritis, Headache(784.0), Hypertension, Hypothyroidism, Obesity, and Syncope, near (1 year ago). here with:  1.  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  She had 2 near events, that quickly subsided in the summer, but none in the last 6 months.  We will continue on current dose of Trileptal 150  mg twice  a day.  I will check labs today.  If she has any more events, or near events then she is to let me know.  We will increase the dose.  For now, is fearful of adjusting the medication since tolerating well, is sensitive to medications, also her husband will be starting radiation this is a lot of stress for her.  She will follow-up in 6 months or sooner if needed.  I spent 20 minutes of face-to-face and non-face-to-face time with patient.  This included previsit chart review, lab review, study review, order entry, electronic health record documentation, patient education.  Butler Denmark, AGNP-C, DNP 01/06/2021, 2:46 PM [facility] [address], [place], [place] [address] [phone]

## 2021-01-06 NOTE — Patient Instructions (Signed)
Call for any more spells Continue current medications Check labs  See you back in 6 months

## 2021-01-06 NOTE — Progress Notes (Signed)
I have read the note, and I agree with the clinical assessment and plan.  Sreekar Broyhill K Namya Voges   

## 2021-01-08 LAB — COMPREHENSIVE METABOLIC PANEL
ALT: 13 IU/L (ref 0–32)
AST: 19 IU/L (ref 0–40)
Albumin/Globulin Ratio: 1.4 (ref 1.2–2.2)
Albumin: 3.8 g/dL (ref 3.7–4.7)
Alkaline Phosphatase: 80 IU/L (ref 44–121)
BUN/Creatinine Ratio: 15 (ref 12–28)
BUN: 11 mg/dL (ref 8–27)
Bilirubin Total: <0.2 mg/dL (ref 0.0–1.2)
CO2: 27 mmol/L (ref 20–29)
Calcium: 9.3 mg/dL (ref 8.7–10.3)
Chloride: 101 mmol/L (ref 96–106)
Creatinine, Ser: 0.71 mg/dL (ref 0.57–1.00)
GFR calc Af Amer: 94 mL/min/{1.73_m2} (ref >59)
GFR calc non Af Amer: 82 mL/min/{1.73_m2} (ref >59)
Globulin, Total: 2.8 g/dL (ref 1.5–4.5)
Glucose: 89 mg/dL (ref 65–99)
Potassium: 4.5 mmol/L (ref 3.5–5.2)
Sodium: 140 mmol/L (ref 134–144)
Total Protein: 6.6 g/dL (ref 6.0–8.5)

## 2021-01-08 LAB — CBC WITH DIFFERENTIAL/PLATELET
Basophils Absolute: 0.1 10*3/uL (ref 0.0–0.2)
Basos: 1 %
EOS (ABSOLUTE): 0.2 10*3/uL (ref 0.0–0.4)
Eos: 3 %
Hematocrit: 36.2 % (ref 34.0–46.6)
Hemoglobin: 12.4 g/dL (ref 11.1–15.9)
Immature Grans (Abs): 0 10*3/uL (ref 0.0–0.1)
Immature Granulocytes: 0 %
Lymphocytes Absolute: 2.8 10*3/uL (ref 0.7–3.1)
Lymphs: 40 %
MCH: 30.7 pg (ref 26.6–33.0)
MCHC: 34.3 g/dL (ref 31.5–35.7)
MCV: 90 fL (ref 79–97)
Monocytes Absolute: 0.9 10*3/uL (ref 0.1–0.9)
Monocytes: 13 %
Neutrophils Absolute: 2.9 10*3/uL (ref 1.4–7.0)
Neutrophils: 43 %
Platelets: 232 10*3/uL (ref 150–450)
RBC: 4.04 x10E6/uL (ref 3.77–5.28)
RDW: 11.5 % — ABNORMAL LOW (ref 11.7–15.4)
WBC: 7 10*3/uL (ref 3.4–10.8)

## 2021-01-08 LAB — 10-HYDROXYCARBAZEPINE: Oxcarbazepine SerPl-Mcnc: 4 ug/mL — ABNORMAL LOW (ref 10–35)

## 2021-01-13 ENCOUNTER — Telehealth: Payer: Self-pay

## 2021-01-13 NOTE — Telephone Encounter (Signed)
Pt verified by name and DOB,  normal results given per provider, pt voiced understanding all question answered. °

## 2021-01-13 NOTE — Telephone Encounter (Signed)
-----   Message from Suzzanne Cloud, NP sent at 01/11/2021  6:07 AM EST ----- Please call blood work is unremarkable.  If she has any further spells, or near spells, she is to let me know.

## 2021-06-30 ENCOUNTER — Other Ambulatory Visit: Payer: Self-pay | Admitting: Neurology

## 2021-07-07 ENCOUNTER — Ambulatory Visit: Payer: Medicare Other | Admitting: Neurology

## 2021-08-06 ENCOUNTER — Ambulatory Visit: Payer: Medicare Other | Admitting: Neurology

## 2021-08-06 ENCOUNTER — Encounter: Payer: Self-pay | Admitting: Neurology

## 2021-08-06 VITALS — BP 117/68 | HR 61 | Ht 67.0 in | Wt 154.0 lb

## 2021-08-06 DIAGNOSIS — R55 Syncope and collapse: Secondary | ICD-10-CM | POA: Diagnosis not present

## 2021-08-06 NOTE — Progress Notes (Signed)
Reason for visit: Syncope  Sarah [name] is an 80 y.o. female  History of present illness:  Sarah [name] is a 80 year old right-handed white female with a history of recurrent episodes of syncope.  The patient is on a very low-dose of Trileptal taking 150 milligrams twice daily.  She has not had any recurrence of symptoms since last seen.  The blood levels are quite low with the Trileptal but it seems to be effective for her.  The patient is being treated for presumed seizures as syncopal episodes on cardiac monitor have not shown any cardiac rhythm abnormalities.  The patient gets about 4 hours of sleep at night but this is adequate for her, she has always done this.  She has good energy level during the day.  She returns for an evaluation at this time.  She is operating a motor vehicle.  Past Medical History:  Diagnosis Date   Anemia    Degenerative arthritis    Headache(784.0)    sinus   Hypertension    Hypothyroidism    Obesity    Syncope, near 1 year ago    Past Surgical History:  Procedure Laterality Date   ABDOMINAL HYSTERECTOMY     partial   arthroscopic knee surgery  July 03, 2013   CARDIAC CATHETERIZATION N/A 09/26/2016   Procedure: Left Heart Cath and Coronary Angiography;  Surgeon: Jettie Booze, MD;  Location: [place] [facility];  Service: Cardiovascular;  Laterality: N/A;   CARPAL TUNNEL RELEASE Bilateral    CATARACT EXTRACTION, BILATERAL     CERVICAL SPINE SURGERY     COLONOSCOPY WITH PROPOFOL N/A 12/20/2019   Procedure: COLONOSCOPY WITH PROPOFOL;  Surgeon: Carol Ada, MD;  Location: [facility];  Service: Endoscopy;  Laterality: N/A;   cyst removed from eyebrow  4-5 yrs ago   HEMOSTASIS CLIP PLACEMENT  12/20/2019   Procedure: HEMOSTASIS CLIP PLACEMENT;  Surgeon: Carol Ada, MD;  Location: [facility];  Service: Endoscopy;;   LUMBAR SPINE SURGERY     POLYPECTOMY  12/20/2019   Procedure: POLYPECTOMY;  Surgeon: Carol Ada, MD;  Location: [facility];  Service: Endoscopy;;   SUBMUCOSAL LIFTING INJECTION  12/20/2019   Procedure: SUBMUCOSAL LIFTING INJECTION;  Surgeon: Carol Ada, MD;  Location: [facility];  Service: Endoscopy;;   TONSILLECTOMY  age 64 or 15   TOTAL KNEE ARTHROPLASTY Right 12/18/2013   Procedure: RIGHT TOTAL KNEE ARTHROPLASTY;  Surgeon: Tobi Bastos, MD;  Location: [facility];  Service: Orthopedics;  Laterality: Right;    Family History  Problem Relation Age of Onset   Alzheimer's disease Sister    Cancer - Lung Sister    Heart disease Brother    Heart disease Brother    Heart disease Brother    Cancer Brother        Bladder cancer    Social history:  reports that she has never smoked. She has never used smokeless tobacco. She reports that she does not drink alcohol and does not use drugs.    Allergies  Allergen Reactions   Adhesive [Tape] Swelling    redness   Atorvastatin Other (See Comments)    Fatigue, leg cramps   Keppra [Levetiracetam]     jitteriness   Other     Band-Aid cause patient to swell   Oxycodone-Acetaminophen Nausea And Vomiting   Penicillins Hives    Did it involve swelling of the face/tongue/throat, SOB, or low BP? No Did it involve sudden or severe rash/hives, skin peeling, or  any reaction on the inside of your mouth or nose? Yes Did you need to seek medical attention at a hospital or doctor's office? Yes When did it last happen?      10+ years If all above answers are "NO", may proceed with cephalosporin use.    Voltaren [Diclofenac Sodium] Hives and Rash    Medications:  Prior to Admission medications   Medication Sig Start Date End Date Taking? Authorizing Provider  acetaminophen (TYLENOL) 500 MG tablet Take 500-1,000 mg by mouth every 6 (six) hours as needed for moderate pain or headache.   Yes [provider]  b complex vitamins tablet Take 1 tablet by mouth daily.   Yes [provider]  Calcium Citrate-Vitamin D (CALCIUM + D PO) Take 1 tablet  by mouth daily.   Yes [provider]  Carboxymethylcellul-Glycerin (LUBRICATING EYE DROPS OP) Place 1 drop into both eyes daily as needed (dry eyes).   Yes [provider]  Cyanocobalamin (B-12) 2500 MCG TABS Take 2,500 mcg by mouth daily.   Yes [provider]  estradiol (ESTRACE) 1 MG tablet Take 0.5 mg by mouth daily with breakfast.  05/07/13  Yes [provider]  ferrous sulfate 325 (65 FE) MG tablet Take 325 mg by mouth daily.   Yes [provider]  fluticasone (FLONASE) 50 MCG/ACT nasal spray Place 1 spray into both nostrils as needed for allergies.    Yes [provider]  levocetirizine (XYZAL) 5 MG tablet Take 5 mg by mouth every evening.   Yes [provider]  levothyroxine (SYNTHROID, LEVOTHROID) 75 MCG tablet Take 75 mcg by mouth every morning.  04/08/13  Yes [provider]  OXcarbazepine (TRILEPTAL) 150 MG tablet TAKE ONE TABLET BY MOUTH TWICE A DAY 06/30/21  Yes Suzzanne Cloud, NP    ROS:  Out of a complete 14 system review of symptoms, the patient complains only of the following symptoms, and all other reviewed systems are negative.  History of syncope  Blood pressure 117/68, pulse 61, height '5\' 7"'$  (1.702 m), weight 154 lb (69.9 kg).  Physical Exam  General: The patient is alert and cooperative at the time of the examination.  Skin: No significant peripheral edema is noted.   Neurologic Exam  Mental status: The patient is alert and oriented x 3 at the time of the examination. The patient has apparent normal recent and remote memory, with an apparently normal attention span and concentration ability.   Cranial nerves: Facial symmetry is present. Speech is normal, no aphasia or dysarthria is noted. Extraocular movements are full. Visual fields are full.  Motor: The patient has good strength in all 4 extremities.  Sensory examination: Soft touch sensation is symmetric on the face, arms, and  legs.  Coordination: The patient has good finger-nose-finger and heel-to-shin bilaterally.  Gait and station: The patient has a normal gait. Tandem gait is minimally unsteady. Romberg is negative. No drift is seen.  Reflexes: Deep tendon reflexes are symmetric.   Assessment/Plan:  1.  Recurrent syncope  The patient is doing quite well with her syncope, we will continue very low-dose Trileptal.  She will follow-up in 1 year, sooner if needed.  In the future, she can be followed through Dr. Brett Fairy.  Jill Alexanders MD 08/06/2021 9:57 AM  [facility] [address] [place], [place] [address]  Phone [phone] Fax [phone]

## 2021-11-15 NOTE — Progress Notes (Signed)
New Patient Note  RE: KIERYN [name] MRN: [id number] DOB: 15-May-1941 Date of Office Visit: 11/16/2021  Consult requested by: Christain Sacramento, MD Primary care provider: Christain Sacramento, MD  Chief Complaint: Allergy Testing  History of Present Illness: I had the pleasure of seeing Sarah [name] for initial evaluation at the [facility] on 11/16/2021. She is a 80 y.o. female, who is referred here by Christain Sacramento, MD for the evaluation of allergy testing.  She reports symptoms of sneezing, itchy/watery eyes, nasal congestion. Symptoms have been going on for 5-10 years. The symptoms are present all year around with worsening in spring and summer. Other triggers include exposure to change in environment. Anosmia: no. Headache: sometimes. She has used Xyzal and Flonase with some improvement in symptoms. Sinus infections: no. Previous work up includes: 30 years ago and not sure of test results. No prior AIT. Previous ENT evaluation: no. Previous sinus imaging: no. History of nasal polyps: no. Last eye exam: 3 years ago. History of reflux: no.  Assessment and Plan: [name] is a 80 y.o. female with: Chronic rhinitis Perennial rhinoconjunctivitis symptoms for the last 10 years with worse in the spring and summer.  Tried Xyzal and Flonase with some benefit.  Skin testing 30 years ago with unknown results.  No prior AIT.  No prior ENT evaluation. Unable to skin test today due to recent antihistamine intake. Return next week for skin testing - hold antihistamines for 3 days before. Will make additional recommendations based on results.  Other adverse food reactions, not elsewhere classified, subsequent encounter Dark chocolate causes rash and nausea. Tolerates regular chocolate and milk. Garlic causes rash.  Shrimp causes nausea but still consumes occasionally. Vinegar based dressing causes shortness of breath. Unable to skin test today due to recent antihistamine  intake. Continue to avoid vinegar, chocolate, shellfish and garlic.  Bring in the vinegar dressing that gave you issues so we can look at its ingredient list. Plan on skin testing to chocolate, garlic, shellfish and others if needed.   Allergic contact dermatitis Certain personal care products cause rash. See below for proper skin care.  Continue to avoid personal care items with fragrances.   Return in about 1 week (around 11/23/2021) for Skin testing.  No orders of the defined types were placed in this encounter.  Lab Orders  No laboratory test(s) ordered today    Other allergy screening: Asthma: no Food allergy:  Vinegar dressing causes shortness of breath for the past 6 months. Dark chocolate causes rash on the arms and nausea for many years. Garlic causes rash on the arms for the past 5 years. Shrimp sometimes causes nausea.   Past work up includes: none. Dietary History: patient has been eating other foods including milk, eggs, peanut, treenuts, sesame, shellfish, fish, soy, wheat, meats, fruits and vegetables.  She reports reading labels and avoiding vinegar, chocolate, garlic in diet completely.   Medication allergy: yes Hymenoptera allergy: no Urticaria: no Eczema:no Certain fragrances, deodorants and lotions cause rash.  History of recurrent infections suggestive of immunodeficency: no  Diagnostics: Skin Testing: Deferred due to recent antihistamines use.   Past Medical History: Patient Active Problem List   Diagnosis Date Noted   Chronic rhinitis 11/16/2021   Other adverse food reactions, not elsewhere classified, subsequent encounter 11/16/2021   Allergic contact dermatitis 11/16/2021   Headache syndrome 04/01/2020   Bilateral leg edema 04/12/2017   CAD (coronary artery disease) 04/12/2017   Abnormal nuclear stress  test    Essential hypertension 09/24/2016   Hypothyroidism 09/24/2016   Chest pain 09/24/2016   Pain of left calf 09/24/2016    Postoperative anemia due to acute blood loss 12/20/2013   Acute blood loss anemia 12/20/2013   Osteoarthritis of right knee 12/18/2013   Obesity 12/18/2013   History of total knee arthroplasty 12/18/2013   Headache(784.0) 01/07/2013   Syncope and collapse 01/07/2013   Past Medical History:  Diagnosis Date   Anemia    Degenerative arthritis    Headache(784.0)    sinus   Hypertension    Hypothyroidism    Obesity    Syncope, near 1 year ago   Past Surgical History: Past Surgical History:  Procedure Laterality Date   ABDOMINAL HYSTERECTOMY     partial   arthroscopic knee surgery  July 03, 2013   CARDIAC CATHETERIZATION N/A 09/26/2016   Procedure: Left Heart Cath and Coronary Angiography;  Surgeon: Jettie Booze, MD;  Location: [facility];  Service: Cardiovascular;  Laterality: N/A;   CARPAL TUNNEL RELEASE Bilateral    CATARACT EXTRACTION, BILATERAL     CERVICAL SPINE SURGERY     COLONOSCOPY WITH PROPOFOL N/A 12/20/2019   Procedure: COLONOSCOPY WITH PROPOFOL;  Surgeon: Carol Ada, MD;  Location: [facility];  Service: Endoscopy;  Laterality: N/A;   cyst removed from eyebrow  4-5 yrs ago   HEMOSTASIS CLIP PLACEMENT  12/20/2019   Procedure: HEMOSTASIS CLIP PLACEMENT;  Surgeon: Carol Ada, MD;  Location: [facility];  Service: Endoscopy;;   LUMBAR SPINE SURGERY     POLYPECTOMY  12/20/2019   Procedure: POLYPECTOMY;  Surgeon: Carol Ada, MD;  Location: [facility];  Service: Endoscopy;;   SUBMUCOSAL LIFTING INJECTION  12/20/2019   Procedure: SUBMUCOSAL LIFTING INJECTION;  Surgeon: Carol Ada, MD;  Location: [facility];  Service: Endoscopy;;   TONSILLECTOMY  age 7 or 65   TOTAL KNEE ARTHROPLASTY Right 12/18/2013   Procedure: RIGHT TOTAL KNEE ARTHROPLASTY;  Surgeon: Tobi Bastos, MD;  Location: [facility];  Service: Orthopedics;  Laterality: Right;   Medication List:  Current Outpatient Medications  Medication Sig Dispense Refill   acetaminophen (TYLENOL)  500 MG tablet Take 500-1,000 mg by mouth every 6 (six) hours as needed for moderate pain or headache.     b complex vitamins tablet Take 1 tablet by mouth daily.     Calcium Citrate-Vitamin D (CALCIUM + D PO) Take 1 tablet by mouth daily.     Carboxymethylcellul-Glycerin (LUBRICATING EYE DROPS OP) Place 1 drop into both eyes daily as needed (dry eyes).     Cyanocobalamin (B-12) 2500 MCG TABS Take 2,500 mcg by mouth daily.     estradiol (ESTRACE) 1 MG tablet Take 0.5 mg by mouth daily with breakfast.      ferrous sulfate 325 (65 FE) MG tablet Take 325 mg by mouth daily.     fluticasone (FLONASE) 50 MCG/ACT nasal spray Place 1 spray into both nostrils as needed for allergies.      levocetirizine (XYZAL) 5 MG tablet Take 5 mg by mouth every evening.     levothyroxine (SYNTHROID, LEVOTHROID) 75 MCG tablet Take 75 mcg by mouth every morning.      OXcarbazepine (TRILEPTAL) 150 MG tablet TAKE ONE TABLET BY MOUTH TWICE A DAY 180 tablet 1   No current facility-administered medications for this visit.   Allergies: Allergies  Allergen Reactions   Adhesive [Tape] Swelling    redness   Atorvastatin Other (See Comments)    Fatigue, leg  cramps   Keppra [Levetiracetam]     jitteriness   Other     Band-Aid cause patient to swell   Oxycodone-Acetaminophen Nausea And Vomiting   Penicillins Hives    Did it involve swelling of the face/tongue/throat, SOB, or low BP? No Did it involve sudden or severe rash/hives, skin peeling, or any reaction on the inside of your mouth or nose? Yes Did you need to seek medical attention at a hospital or doctor's office? Yes When did it last happen?      10+ years If all above answers are "NO", may proceed with cephalosporin use.    Voltaren [Diclofenac Sodium] Hives and Rash   Social History: Social History   Socioeconomic History   Marital status: Married    Spouse name: Not on file   Number of children: 3   Years of education: 12   Highest education level:  Not on file  Occupational History   Not on file  Tobacco Use   Smoking status: Never   Smokeless tobacco: Never  Substance and Sexual Activity   Alcohol use: No   Drug use: No   Sexual activity: Not on file  Other Topics Concern   Not on file  Social History Narrative   Not on file   Social Determinants of Health   Financial Resource Strain: Not on file  Food Insecurity: Not on file  Transportation Needs: Not on file  Physical Activity: Not on file  Stress: Not on file  Social Connections: Not on file   Lives in a 79 year old house. Smoking: denies Occupation: retired  Programme researcher, broadcasting/film/video HistoryFreight forwarder in the house: no Charity fundraiser in the family room: yes Carpet in the bedroom: yes Heating: heat pump Cooling: heat pump Pet: no  Family History: Family History  Problem Relation Age of Onset   Alzheimer's disease Sister    Cancer - Lung Sister    Heart disease Brother    Heart disease Brother    Heart disease Brother    Cancer Brother        Bladder cancer   Review of Systems  Constitutional:  Negative for appetite change, chills, fever and unexpected weight change.  HENT:  Positive for congestion and sneezing. Negative for rhinorrhea.   Eyes:  Positive for itching.  Respiratory:  Negative for cough, chest tightness, shortness of breath and wheezing.   Cardiovascular:  Negative for chest pain.  Gastrointestinal:  Negative for abdominal pain.  Genitourinary:  Negative for difficulty urinating.  Skin:  Negative for rash.  Neurological:  Negative for headaches.   Objective: BP 114/66   Pulse 62   Temp (!) 97.1 F (36.2 C) (Temporal)   Resp 16   Ht 5\' 7"  (1.702 m)   Wt 157 lb (71.2 kg)   LMP  (LMP Unknown)   SpO2 99%   BMI 24.59 kg/m  Body mass index is 24.59 kg/m. Physical Exam Vitals and nursing note reviewed.  Constitutional:      Appearance: Normal appearance. She is well-developed.  HENT:     Head: Normocephalic and atraumatic.     Right  Ear: Tympanic membrane and external ear normal.     Left Ear: Tympanic membrane and external ear normal.     Nose: Nose normal.     Mouth/Throat:     Mouth: Mucous membranes are moist.     Pharynx: Oropharynx is clear.  Eyes:     Conjunctiva/sclera: Conjunctivae normal.  Cardiovascular:     Rate and  Rhythm: Normal rate and regular rhythm.     Heart sounds: Normal heart sounds. No murmur heard.   No friction rub. No gallop.  Pulmonary:     Effort: Pulmonary effort is normal.     Breath sounds: Normal breath sounds. No wheezing, rhonchi or rales.  Musculoskeletal:     Cervical back: Neck supple.  Skin:    General: Skin is warm.     Findings: No rash.  Neurological:     Mental Status: She is alert and oriented to person, place, and time.  Psychiatric:        Behavior: Behavior normal.  The plan was reviewed with the patient/family, and all questions/concerned were addressed.  It was my pleasure to see Sarah [name] today and participate in her care. Please feel free to contact me with any questions or concerns.  Sincerely,  Rexene Alberts, DO Allergy & Immunology  [facility] office: [place] office: [phone]

## 2021-11-16 ENCOUNTER — Encounter: Payer: Self-pay | Admitting: Allergy

## 2021-11-16 ENCOUNTER — Ambulatory Visit: Payer: Medicare Other | Admitting: Allergy

## 2021-11-16 ENCOUNTER — Other Ambulatory Visit: Payer: Self-pay

## 2021-11-16 VITALS — BP 114/66 | HR 62 | Temp 97.1°F | Resp 16 | Ht 67.0 in | Wt 157.0 lb

## 2021-11-16 DIAGNOSIS — L239 Allergic contact dermatitis, unspecified cause: Secondary | ICD-10-CM

## 2021-11-16 DIAGNOSIS — T781XXD Other adverse food reactions, not elsewhere classified, subsequent encounter: Secondary | ICD-10-CM | POA: Insufficient documentation

## 2021-11-16 DIAGNOSIS — J310 Chronic rhinitis: Secondary | ICD-10-CM | POA: Insufficient documentation

## 2021-11-16 NOTE — Assessment & Plan Note (Signed)
Perennial rhinoconjunctivitis symptoms for the last 10 years with worse in the spring and summer.  Tried Xyzal and Flonase with some benefit.  Skin testing 30 years ago with unknown results.  No prior AIT.  No prior ENT evaluation.  Unable to skin test today due to recent antihistamine intake.  Return next week for skin testing - hold antihistamines for 3 days before.  Will make additional recommendations based on results.

## 2021-11-16 NOTE — Assessment & Plan Note (Signed)
Certain personal care products cause rash.  See below for proper skin care.   Continue to avoid personal care items with fragrances.

## 2021-11-16 NOTE — Patient Instructions (Addendum)
Unable to skin test today due to recent antihistamine intake.  Return next week for skin testing - NO allergy medications for 3 days. (No levocetirizine after Friday).  Will make additional recommendations based on results.   Food:  Continue to avoid vinegar, chocolate, shellfish and garlic.  Bring in the vinegar dressing that gave you issues.   Skin: See below for proper skin care.  Continue to avoid personal care items with fragrances.   Follow up for skin testing next week with Webb Silversmith.   Skin care recommendations  Bath time: Always use lukewarm water. AVOID very hot or cold water. Keep bathing time to 5-10 minutes. Do NOT use bubble bath. Use a mild soap and use just enough to wash the dirty areas. Do NOT scrub skin vigorously.  After bathing, pat dry your skin with a towel. Do NOT rub or scrub the skin.  Moisturizers and prescriptions:  ALWAYS apply moisturizers immediately after bathing (within 3 minutes). This helps to lock-in moisture. Use the moisturizer several times a day over the whole body. Good summer moisturizers include: Aveeno, CeraVe, Cetaphil. Good winter moisturizers include: Aquaphor, Vaseline, Cerave, Cetaphil, Eucerin, Vanicream. When using moisturizers along with medications, the moisturizer should be applied about one hour after applying the medication to prevent diluting effect of the medication or moisturize around where you applied the medications. When not using medications, the moisturizer can be continued twice daily as maintenance.  Laundry and clothing: Avoid laundry products with added color or perfumes. Use unscented hypo-allergenic laundry products such as Tide free, Cheer free & gentle, and All free and clear.  If the skin still seems dry or sensitive, you can try double-rinsing the clothes. Avoid tight or scratchy clothing such as wool. Do not use fabric softeners or dyer sheets.

## 2021-11-16 NOTE — Assessment & Plan Note (Addendum)
Dark chocolate causes rash and nausea. Tolerates regular chocolate and milk. Garlic causes rash.  Shrimp causes nausea but still consumes occasionally. Vinegar based dressing causes shortness of breath.  Unable to skin test today due to recent antihistamine intake.  Continue to avoid vinegar, chocolate, shellfish and garlic.   Bring in the vinegar dressing that gave you issues so we can look at its ingredient list.  Plan on skin testing to chocolate, garlic, shellfish and others if needed.

## 2021-11-25 ENCOUNTER — Encounter: Payer: Self-pay | Admitting: Family Medicine

## 2021-11-25 ENCOUNTER — Other Ambulatory Visit: Payer: Self-pay

## 2021-11-25 ENCOUNTER — Ambulatory Visit: Payer: Medicare Other | Admitting: Family Medicine

## 2021-11-25 VITALS — BP 118/76 | HR 69 | Temp 98.2°F | Resp 18 | Ht 65.0 in | Wt 159.8 lb

## 2021-11-25 DIAGNOSIS — L239 Allergic contact dermatitis, unspecified cause: Secondary | ICD-10-CM

## 2021-11-25 DIAGNOSIS — J310 Chronic rhinitis: Secondary | ICD-10-CM

## 2021-11-25 DIAGNOSIS — T781XXD Other adverse food reactions, not elsewhere classified, subsequent encounter: Secondary | ICD-10-CM

## 2021-11-25 MED ORDER — AZELASTINE HCL 0.1 % NA SOLN: 2.0000 | Freq: Two times a day (BID) | NASAL | 5 refills | Status: DC

## 2021-11-25 NOTE — Patient Instructions (Signed)
Chronic rhinitis/conjunctivitis Your skin testing was negative at today's visit Continue Flonase 2 sprays in each nostril once a day as needed for a stuffy nose Begin azelastine 2 sprays in each nostril twice a day as needed for runny nose Consider saline nasal rinses as needed for nasal symptoms. Use this before any medicated nasal sprays for best result Some over the counter eye drops include Pataday one drop in each eye once a day as needed for red, itchy eyes OR Zaditor one drop in each eye twice a day as needed for red itchy eyes.  Food allergy Your skin testing was negative to chocolate, garlic, shellfish mix, shrimp, and lobster. Continue to avoid shrimp, lobster, chocolate, vinegar, and garlic.   In case of an allergic reaction, take Benadryl 50 mg every 4 hours, and for life threatening reactions call 911.  Orders have been placed for blood work to help [place] evaluate your food allergies.  We will call you when these become available. Bring the vinegar based dressings that make you feel short of breath to your next appointment  Allergic contact dermatitis Continue to avoid personal care products with fragrances If your symptoms re-occur, begin a journal of events that occurred for up to 6 hours before your symptoms began including foods and beverages consumed, soaps or perfumes you had contact with, and medications.   Call the clinic if this treatment plan is not working well for you  Follow up in 3 months or sooner if needed.

## 2021-11-25 NOTE — Progress Notes (Signed)
[address] [place] [address] Dept: [phone]  FOLLOW UP NOTE  Patient ID: Sarah [name], female    DOB: 10/23/1941  Age: 80 y.o. MRN: [id number] Date of Office Visit: 11/25/2021  Assessment  Chief Complaint: Allergy Testing (1-59, chocolate causes stomach irritation mostly dark chocolate, and garlic causes her to break out. /) and Allergic Rhinitis  (No issues - worse in spring and summertime)  HPI Sarah [name] is a 80 year old female who presents to the clinic for follow-up visit.  She was last seen in this clinic on 11/16/2021 by Dr. Maudie Mercury for evaluation of chronic rhinitis, allergic contact dermatitis, and possible food allergy to chocolate, garlic, and shellfish.  At today's visit, she reports she is feeling well overall and has not had any antihistamines over the last 3 days.  Allergic rhinitis is reported as not well controlled with symptoms including nasal congestion, clear rhinorrhea, sneezing, and occasional postnasal drainage.  She reports her symptoms are year-round however are worse in the spring and summer.  She continues to avoid chocolate and garlic.  She reports that garlic makes her skin break out in a rash that lasts about 1 day every time she eats and.  She reports chocolate makes her vomit and feel sick to her stomach.  She continues to tolerate lactose-free milk with no adverse reaction.  She does report feeling stomach upset when eating boiled or roasted shrimp, however, she tolerates grilled shrimp with no adverse reaction.  She continues to consume crab, oyster, and scallops with no adverse reaction.  Her current medications are listed in the chart.   Drug Allergies:  Allergies  Allergen Reactions   Atorvastatin Other (See Comments)    Fatigue, leg cramps   Keppra [Levetiracetam]     jitteriness   Other     Band-Aid cause patient to swell   Oxycodone-Acetaminophen Nausea And Vomiting   Tape Swelling and Other (See Comments)    redness    Penicillins Hives    Did it involve swelling of the face/tongue/throat, SOB, or low BP? No Did it involve sudden or severe rash/hives, skin peeling, or any reaction on the inside of your mouth or nose? Yes Did you need to seek medical attention at a hospital or doctor's office? Yes When did it last happen?      10+ years If all above answers are NO, may proceed with cephalosporin use.    Voltaren [Diclofenac Sodium] Hives and Rash    Physical Exam: BP 118/76    Pulse 69    Temp 98.2 F (36.8 C)    Resp 18    Ht 5\' 5"  (1.651 m)    Wt 159 lb 12.8 oz (72.5 kg)    LMP  (LMP Unknown)    SpO2 99%    BMI 26.59 kg/m    Physical Exam Vitals reviewed.  Constitutional:      Appearance: Normal appearance.  HENT:     Head: Normocephalic and atraumatic.     Right Ear: Tympanic membrane normal.     Left Ear: Tympanic membrane normal.     Nose:     Comments: Bilateral nares slightly erythematous with clear nasal drainage noted. Pharynx normal. Ears normal. Eyes normal.    Mouth/Throat:     Pharynx: Oropharynx is clear.  Eyes:     Conjunctiva/sclera: Conjunctivae normal.  Cardiovascular:     Rate and Rhythm: Normal rate and regular rhythm.     Heart sounds: Normal heart sounds. No  murmur heard. Pulmonary:     Effort: Pulmonary effort is normal.     Breath sounds: Normal breath sounds.     Comments: Lungs clear to auscultation Musculoskeletal:        General: Normal range of motion.     Cervical back: Normal range of motion and neck supple.  Skin:    General: Skin is warm and dry.  Neurological:     Mental Status: She is alert and oriented to person, place, and time.  Psychiatric:        Mood and Affect: Mood normal.        Behavior: Behavior normal.        Thought Content: Thought content normal.        Judgment: Judgment normal.    Diagnostics: Percutaneous environmental allergen panel negative with adequate controls.  Intradermal environmental panel was negative with negative  control.   Selected foods were negative to shellfish mix, shrimp, lobster, garlic, and chocolate with adequate controls.   Assessment and Plan: 1. Chronic rhinitis   2. Other adverse food reactions, not elsewhere classified, subsequent encounter   3. Allergic contact dermatitis, unspecified trigger     Meds ordered this encounter  Medications   azelastine (ASTELIN) 0.1 % nasal spray    Sig: Place 2 sprays into both nostrils 2 (two) times daily.    Dispense:  30 mL    Refill:  5    Patient Instructions  Chronic rhinitis/conjunctivitis Your skin testing was negative at today's visit Continue Flonase 2 sprays in each nostril once a day as needed for a stuffy nose Begin azelastine 2 sprays in each nostril twice a day as needed for runny nose Consider saline nasal rinses as needed for nasal symptoms. Use this before any medicated nasal sprays for best result Some over the counter eye drops include Pataday one drop in each eye once a day as needed for red, itchy eyes OR Zaditor one drop in each eye twice a day as needed for red itchy eyes.  Food allergy Your skin testing was negative to chocolate, garlic, shellfish mix, shrimp, and lobster. Continue to avoid shrimp, lobster, chocolate, vinegar, and garlic.   In case of an allergic reaction, take Benadryl 50 mg every 4 hours, and for life threatening reactions call 911.  Orders have been placed for blood work to help [place] evaluate your food allergies.  We will call you when these become available. Bring the vinegar based dressings that make you feel short of breath to your next appointment  Allergic contact dermatitis Continue to avoid personal care products with fragrances If your symptoms re-occur, begin a journal of events that occurred for up to 6 hours before your symptoms began including foods and beverages consumed, soaps or perfumes you had contact with, and medications.   Call the clinic if this treatment plan is not working well  for you  Follow up in 3 months or sooner if needed.   Return in about 3 months (around 02/23/2022), or if symptoms worsen or fail to improve.    Thank you for the opportunity to care for this patient.  Please do not hesitate to contact me with questions.  Gareth Morgan, FNP [facility]

## 2021-12-30 ENCOUNTER — Other Ambulatory Visit: Payer: Self-pay | Admitting: *Deleted

## 2021-12-30 MED ORDER — OXCARBAZEPINE 150 MG PO TABS: 150.0000 mg | ORAL_TABLET | Freq: Two times a day (BID) | ORAL | 1 refills | Status: DC

## 2022-06-25 ENCOUNTER — Other Ambulatory Visit: Payer: Self-pay | Admitting: Neurology

## 2022-07-18 ENCOUNTER — Other Ambulatory Visit: Payer: Self-pay | Admitting: Urology

## 2022-07-18 NOTE — Patient Instructions (Addendum)
DUE TO COVID-19 ONLY TWO VISITORS  (aged 80 and older)  ARE ALLOWED TO COME WITH YOU AND STAY IN THE WAITING ROOM ONLY DURING PRE OP AND PROCEDURE.   **NO VISITORS ARE ALLOWED IN THE SHORT STAY AREA OR RECOVERY ROOM!!**  IF YOU WILL BE ADMITTED INTO THE HOSPITAL YOU ARE ALLOWED ONLY FOUR SUPPORT PEOPLE DURING VISITATION HOURS ONLY (7 AM -8PM)   The support person(s) must pass our screening, gel in and out, and wear a mask at all times, including in the patient's room. Patients must also wear a mask when staff or their support person are in the room. Visitors GUEST BADGE MUST BE WORN VISIBLY  One adult visitor may remain with you overnight and MUST be in the room by 8 P.M.     Your procedure is scheduled on: 07/26/22   Report to [facility] Main Entrance    Report to admitting at : 10:30 AM   Call this number if you have problems the morning of surgery [phone]   Do not eat food :After Midnight.   After Midnight you may have the following liquids until: 9:30 AM DAY OF SURGERY  Water Black Coffee (sugar ok, NO MILK/CREAM OR CREAMERS)  Tea (sugar ok, NO MILK/CREAM OR CREAMERS) regular and decaf                             Plain Jell-O (NO RED)                                           Fruit ices (not with fruit pulp, NO RED)                                     Popsicles (NO RED)                                                                  Juice: apple, WHITE grape, WHITE cranberry Sports drinks like Gatorade (NO RED)              FOLLOW BOWEL PREP AND ANY ADDITIONAL PRE OP INSTRUCTIONS YOU RECEIVED FROM YOUR SURGEON'S OFFICE!!!   Oral Hygiene is also important to reduce your risk of infection.                                    Remember - BRUSH YOUR TEETH THE MORNING OF SURGERY WITH YOUR REGULAR TOOTHPASTE   Do NOT smoke after Midnight   Take these medicines the morning of surgery with A SIP OF WATER: oxcarbazepine,estradiol,synthroid.Tylenol as needed.  DO NOT  TAKE ANY ORAL DIABETIC MEDICATIONS DAY OF YOUR SURGERY  Bring CPAP mask and tubing day of surgery.                              You may not have any metal on your body including hair pins, jewelry, and body piercing  Do not wear make-up, lotions, powders, perfumes/cologne, or deodorant  Do not wear nail polish including gel and S&S, artificial/acrylic nails, or any other type of covering on natural nails including finger and toenails. If you have artificial nails, gel coating, etc. that needs to be removed by a nail salon please have this removed prior to surgery or surgery may need to be canceled/ delayed if the surgeon/ anesthesia feels like they are unable to be safely monitored.   Do not shave  48 hours prior to surgery.    Do not bring valuables to the hospital. [place].   Contacts, dentures or bridgework may not be worn into surgery.   Bring small overnight bag day of surgery.   DO NOT [place]. PHARMACY WILL DISPENSE MEDICATIONS LISTED ON YOUR MEDICATION LIST TO YOU DURING YOUR ADMISSION [place]!    Patients discharged on the day of surgery will not be allowed to drive home.  Someone NEEDS to stay with you for the first 24 hours after anesthesia.   Special Instructions: Bring a copy of your healthcare power of attorney and living will documents         the day of surgery if you haven't scanned them before.              Please read over the following fact sheets you were given: IF YOU HAVE QUESTIONS ABOUT YOUR PRE-OP INSTRUCTIONS PLEASE CALL [phone]     [facility] - Preparing for Surgery Before surgery, you can play an important role.  Because skin is not sterile, your skin needs to be as free of germs as possible.  You can reduce the number of germs on your skin by washing with CHG (chlorahexidine gluconate) soap before surgery.  CHG is an antiseptic cleaner which kills  germs and bonds with the skin to continue killing germs even after washing. Please DO NOT use if you have an allergy to CHG or antibacterial soaps.  If your skin becomes reddened/irritated stop using the CHG and inform your nurse when you arrive at Short Stay. Do not shave (including legs and underarms) for at least 48 hours prior to the first CHG shower.  You may shave your face/neck. Please follow these instructions carefully:  1.  Shower with CHG Soap the night before surgery and the  morning of Surgery.  2.  If you choose to wash your hair, wash your hair first as usual with your  normal  shampoo.  3.  After you shampoo, rinse your hair and body thoroughly to remove the  shampoo.                           4.  Use CHG as you would any other liquid soap.  You can apply chg directly  to the skin and wash                       Gently with a scrungie or clean washcloth.  5.  Apply the CHG Soap to your body ONLY FROM THE NECK DOWN.   Do not use on face/ open                           Wound or open sores. Avoid contact with  eyes, ears mouth and genitals (private parts).                       Wash face,  Genitals (private parts) with your normal soap.             6.  Wash thoroughly, paying special attention to the area where your surgery  will be performed.  7.  Thoroughly rinse your body with warm water from the neck down.  8.  DO NOT shower/wash with your normal soap after using and rinsing off  the CHG Soap.                9.  Pat yourself dry with a clean towel.            10.  Wear clean pajamas.            11.  Place clean sheets on your bed the night of your first shower and do not  sleep with pets. Day of Surgery : Do not apply any lotions/deodorants the morning of surgery.  Please wear clean clothes to the hospital/surgery center.  FAILURE TO FOLLOW THESE INSTRUCTIONS MAY RESULT IN THE CANCELLATION OF YOUR SURGERY PATIENT SIGNATURE_________________________________  NURSE  SIGNATURE__________________________________  ________________________________________________________________________

## 2022-07-20 ENCOUNTER — Other Ambulatory Visit: Payer: Self-pay

## 2022-07-20 ENCOUNTER — Encounter (HOSPITAL_COMMUNITY): Payer: Self-pay

## 2022-07-20 ENCOUNTER — Encounter (HOSPITAL_COMMUNITY)
Admission: RE | Admit: 2022-07-20 | Discharge: 2022-07-20 | Disposition: A | Discharge status: Home / Self Care | Payer: Medicare Other | Source: Ambulatory Visit | Attending: Urology | Admitting: Urology

## 2022-07-20 VITALS — BP 111/56 | HR 62 | Temp 97.7°F | Ht 67.0 in | Wt 151.0 lb

## 2022-07-20 DIAGNOSIS — I10 Essential (primary) hypertension: Secondary | ICD-10-CM | POA: Insufficient documentation

## 2022-07-20 DIAGNOSIS — Z01818 Encounter for other preprocedural examination: Secondary | ICD-10-CM | POA: Diagnosis present

## 2022-07-20 HISTORY — DX: Malignant (primary) neoplasm, unspecified: C80.1

## 2022-07-20 LAB — CBC
HCT: 37.7 % (ref 36.0–46.0)
Hemoglobin: 12.5 g/dL (ref 12.0–15.0)
MCH: 30.4 pg (ref 26.0–34.0)
MCHC: 33.2 g/dL (ref 30.0–36.0)
MCV: 91.7 fL (ref 80.0–100.0)
Platelets: 189 10*3/uL (ref 150–400)
RBC: 4.11 MIL/uL (ref 3.87–5.11)
RDW: 12.2 % (ref 11.5–15.5)
WBC: 6.1 10*3/uL (ref 4.0–10.5)
nRBC: 0 % (ref 0.0–0.2)

## 2022-07-20 LAB — BASIC METABOLIC PANEL
Anion gap: 4 — ABNORMAL LOW (ref 5–15)
BUN: 11 mg/dL (ref 8–23)
CO2: 27 mmol/L (ref 22–32)
Calcium: 9.1 mg/dL (ref 8.9–10.3)
Chloride: 104 mmol/L (ref 98–111)
Creatinine, Ser: 0.58 mg/dL (ref 0.44–1.00)
GFR, Estimated: >60 mL/min (ref >60)
Glucose, Bld: 94 mg/dL (ref 70–99)
Potassium: 4 mmol/L (ref 3.5–5.1)
Sodium: 135 mmol/L (ref 135–145)

## 2022-07-20 NOTE — Progress Notes (Signed)
For Short Stay: [place] appointment date: Date of COVID positive in last 7 days:  Bowel Prep reminder:   For Anesthesia: PCP - Dr. Kathryne Eriksson Cardiologist -   Chest x-ray -  EKG -  Stress Test -  ECHO -09/25/16  Cardiac Cath - 09/26/22 Pacemaker/ICD device last checked: Pacemaker orders received: Device Rep notified:  Spinal Cord Stimulator:  Sleep Study -  CPAP -   Fasting Blood Sugar -  Checks Blood Sugar _____ times a day Date and result of last Hgb A1c-  Blood Thinner Instructions: Aspirin Instructions: Last Dose:  Activity level: Can go up a flight of stairs and activities of daily living without stopping and without chest pain and/or shortness of breath   Able to exercise without chest pain and/or shortness of breath   Unable to go up a flight of stairs without chest pain and/or shortness of breath     Anesthesia review:   Patient denies shortness of breath, fever, cough and chest pain at PAT appointment   Patient verbalized understanding of instructions that were given to them at the PAT appointment. Patient was also instructed that they will need to review over the PAT instructions again at home before surgery.

## 2022-07-25 NOTE — H&P (Signed)
Patient is a 81 year old white female seen today for evaluation of recurrent UTI. The patient states over the last 6 months he has probably had for 5 recurrent urinary tract infections. Her typical presenting symptoms are urgency dysuria and sometimes blood in the urine. Antibiotics seem to resolve the infection fairly quickly. She has not run fever had flank pain and no prior history of stones. Currently asymptomatic. Patient does have what sounds like urge type incontinence and wears a pad 1 to 3/day. She stays dry at night. Review of previous records showed E. coli on all the cultures.  Voided urine today shows 10-20 WBCs few bacteria, postvoid residual is 0 cc  -03/17/22-patient with history of recurrent UTI as above. Here for 28-monthfollow-up. In the interim patient has had no recurrent urinary tract infections or issues.  Micro urinalysis today shows 6-10 WBCs and a few bacteria on voided specimen.  -06/23/22-patient with history of recurrent UTI as above. She presents today with painful gross hematuria. Denies any fever nausea vomiting or flank pain.  Mark urinalysis shows numerous WBCs and bacteria  -07/15/22-patient with recent gross hematuria and E. coli UTI. This was treated with Cipro. She has had CT scan in the interim which shows some thickening in the posterior bladder wall concerning for possible malignancy. See report below here for cystoscopy to assess bladder. In the interim  Micro urinalysis  Cystoscopy is performed today and shows: Probable 2 to 3 cm exophytic bladder tumor emanating from the right floor of the bladder superior lateral to the right ureteral orifice. There are several smaller erythematous areas which look appear inflammatory throughout the bladder and may be a result of having had recent cystitis. Ureteral orifice ease were not involved.   CLINICAL DATA: Gross hematuria   EXAM:  CT ABDOMEN AND PELVIS WITHOUT AND WITH CONTRAST   TECHNIQUE:  Multidetector CT imaging  of the abdomen and pelvis was performed  following the standard protocol before and following the bolus  administration of intravenous contrast.   RADIATION DOSE REDUCTION: This exam was performed according to the  departmental dose-optimization program which includes automated  exposure control, adjustment of the mA and/or kV according to  patient size and/or use of iterative reconstruction technique.   CONTRAST: 125 cc Omnipaque 300   COMPARISON: None Available.   FINDINGS:  Lower chest: Coarse peripheral interstitial accentuation in the lung  bases. Descending thoracic aortic and right coronary artery  atherosclerotic vascular calcification. Small type 1 hiatal hernia.   Hepatobiliary: Punctate calcifications compatible with old  granulomatous disease. Mildly contracted gallbladder. Suspected  depending gallstones in the gallbladder for example on image 47  series 601.   Pancreas: Unremarkable   Spleen: Unremarkable   Adrenals/Urinary Tract: Adrenal glands unremarkable. No significant  abnormal renal parenchymal enhancement.   In the right posterior urinary bladder lateral to the right UVJ, an  irregularly marginated 2.1 by 1.8 by 1.7 cm enhancing mass is  present with frondlike microlobulated border, high suspicion for  urothelial tumor. No other significant abnormal urothelial filling  defect observed.   Stomach/Bowel: Wall thickening in a 14 cm segment of the ascending  colon is probably most attributable to nondistention. Prominence of  stool in the rest of the colon.   Vascular/Lymphatic: Atherosclerosis is present, including aortoiliac  atherosclerotic disease. No pathologic adenopathy is observed.   Reproductive: Uterus absent. Adnexa unremarkable.   Other: No supplemental non-categorized findings.   Musculoskeletal: Posterolateral rod and pedicle screw fixation at 4  mm degenerative retrolisthesis  L1-2.   IMPRESSION:  1. 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 No adenopathy or  findings of metastatic spread.  2. Wall thickening in a 14 cm segment of the ascending colon, likely  most attributable to nondistention. Prominence of stool in the rest  of the colon favors constipation.  3. Coarse peripheral interstitial accentuation in the lung bases.  4. Small type 1 hiatal hernia.  5. Aortic Atherosclerosis (ICD10-I70.0). Coronary atherosclerosis.  Systemic atherosclerosis.  6. Cholelithiasis.  7. Postoperative findings in the lumbar spine.    Electronically Signed  By: Van Clines M.D.  On: 06/30/2022 09:47       ALLERGIES: Cephalexin - Diarrhea oxycodone - Nausea, Vomiting penicillin - Hives, Skin Rash    MEDICATIONS: Levothyroxine Sodium 75 mcg tablet  Calcium 600 + Vit D  Estradiol 1 mg tablet  Fluticasone Propionate 50 mcg/actuation spray, suspension  Iron  Oxcarbazepine 150 mg tablet  Vitamin B Complex  Vitamin B12     GU PSH: Locm 300-'399Mg'$ /Ml Iodine,1Ml - 06/29/2022       PSH Notes: Neck surgery x 2  Back surgery  Removal of Basal Cell Carcinoma   NON-GU PSH: Carpal tunnel surgery Knee Arthroscopy/surgery     GU PMH: Gross hematuria - 06/29/2022, - 06/23/2022 Flank Pain - 06/23/2022 Personal Hx Urinary Tract Infections - 03/17/2022, - 12/27/2021    NON-GU PMH: Pyuria/other UA findings - 03/17/2022, - 12/27/2021 Anxiety Arthritis Seizure disorder    FAMILY HISTORY: Alzheimer's Disease - Father, Sister Lung Cancer - Sister Parkinson's Disease - Brother, Mother Strokes - Brother   SOCIAL HISTORY: Marital Status: Married Current Smoking Status: Patient has never smoked.   Tobacco Use Assessment Completed: Used Tobacco in last 30 days? Has never drank.  Does not drink caffeine.    REVIEW OF SYSTEMS:    GU Review Female:   Patient denies burning /pain with urination, have to strain to urinate, hard to postpone urination,  frequent urination, leakage of urine, get up at night to urinate, trouble starting your stream, stream starts and stops, and being pregnant.  Gastrointestinal (Upper):   Patient denies nausea, vomiting, and indigestion/ heartburn.  Gastrointestinal (Lower):   Patient denies diarrhea and constipation.  Constitutional:   Patient denies fever, night sweats, weight loss, and fatigue.  Skin:   Patient denies skin rash/ lesion and itching.  Eyes:   Patient denies blurred vision and double vision.  Ears/ Nose/ Throat:   Patient denies sore throat and sinus problems.  Hematologic/Lymphatic:   Patient denies swollen glands and easy bruising.  Cardiovascular:   Patient denies leg swelling and chest pains.  Respiratory:   Patient denies cough and shortness of breath.  Endocrine:   Patient denies excessive thirst.  Musculoskeletal:   Patient denies back pain and joint pain.  Neurological:   Patient denies headaches and dizziness.  Psychologic:   Patient denies depression and anxiety.   VITAL SIGNS: None   PAST DATA REVIEW: None   PROCEDURES:         Flexible Cystoscopy - 52000  Risks, benefits, and some of the potential complications of the procedure were discussed at length with the patient including infection, bleeding, voiding discomfort, urinary retention, fever, chills, sepsis, and others. All questions were answered. Informed consent was obtained. Antibiotic prophylaxis was given. Sterile technique and intraurethral analgesia were used.  Meatus:  Normal size. Normal location. Normal condition.  Urethra:  No hypermobility. No leakage.  Ureteral Orifices:  Normal location. Normal size. Normal shape. Effluxed clear urine.  Bladder:  Cystoscopy is performed today and shows: Probable 2 to 3 cm exophytic bladder tumor emanating from the right floor of the bladder superior lateral to the right ureteral orifice. There are several smaller erythematous areas which look appear inflammatory throughout the  bladder and may be a result of having had recent cystitis. Ureteral orifice ease were not involved.      The lower urinary tract was carefully examined. The procedure was well-tolerated and without complications. Antibiotic instructions were given. Instructions were given to call the office immediately for bloody urine, difficulty urinating, urinary retention, painful or frequent urination, fever, chills, nausea, vomiting or other illness. The patient stated that she understood these instructions and would comply with them.         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 She does have some areas which likely are inflammatory as well throughout the bladder. Plan to place her on Cipro 500 mg twice daily for 7 days for this. We discussed treatment options for her bladder tumor and recommended cystoscopy bilateral retrogrades and TURBT. We will schedule accordingly in the near future. Risk and benefits discussed as outlined below. Patient agreeable to schedule.  TURBT consent: I have discussed  with the patient the risks, benefits of TURBT which include but are not limited to: Bleeding, infection, damage to the bladder with potential perforation of the bladder, damage to surrounding organs, possible need for further procedures including open repair and catheterization, possibility of nonhealing area within the bladder, urgency, frequency which may be refractory to medications. I pointed out that in some occasions after resection of the bladder tumor, mitomycin-C chemotherapy may be instilled into the bladder. The risks associated with this therapy include but are not limited to: Refractory or new onset urgency, frequency, dysuria, infrequently severe systemic side effects secondary to mitomycin-C. After full discussion of the risks, benefits and alternatives, the patient has consented to the above procedure and desires to proceed.

## 2022-07-26 ENCOUNTER — Other Ambulatory Visit: Payer: Self-pay

## 2022-07-26 ENCOUNTER — Encounter (HOSPITAL_COMMUNITY)
Admission: RE | Disposition: A | Discharge status: Home / Self Care | Payer: Self-pay | Source: Ambulatory Visit | Attending: Urology

## 2022-07-26 ENCOUNTER — Encounter (HOSPITAL_COMMUNITY): Payer: Self-pay | Admitting: Urology

## 2022-07-26 ENCOUNTER — Ambulatory Visit (HOSPITAL_COMMUNITY): Payer: Medicare Other

## 2022-07-26 ENCOUNTER — Ambulatory Visit (HOSPITAL_BASED_OUTPATIENT_CLINIC_OR_DEPARTMENT_OTHER): Payer: Medicare Other | Admitting: Certified Registered"

## 2022-07-26 ENCOUNTER — Ambulatory Visit (HOSPITAL_COMMUNITY): Payer: Medicare Other | Admitting: Certified Registered"

## 2022-07-26 ENCOUNTER — Ambulatory Visit (HOSPITAL_COMMUNITY)
Admission: RE | Admit: 2022-07-26 | Discharge: 2022-07-26 | Disposition: A | Discharge status: Home / Self Care | Payer: Medicare Other | Source: Ambulatory Visit | Attending: Urology | Admitting: Urology

## 2022-07-26 DIAGNOSIS — E039 Hypothyroidism, unspecified: Secondary | ICD-10-CM | POA: Diagnosis not present

## 2022-07-26 DIAGNOSIS — M1990 Unspecified osteoarthritis, unspecified site: Secondary | ICD-10-CM | POA: Diagnosis not present

## 2022-07-26 DIAGNOSIS — R310 Gross hematuria: Secondary | ICD-10-CM | POA: Diagnosis not present

## 2022-07-26 DIAGNOSIS — I10 Essential (primary) hypertension: Secondary | ICD-10-CM | POA: Diagnosis not present

## 2022-07-26 DIAGNOSIS — C679 Malignant neoplasm of bladder, unspecified: Secondary | ICD-10-CM | POA: Insufficient documentation

## 2022-07-26 DIAGNOSIS — Z87440 Personal history of urinary (tract) infections: Secondary | ICD-10-CM | POA: Diagnosis not present

## 2022-07-26 HISTORY — PX: CYSTOSCOPY W/ RETROGRADES: SHX1426

## 2022-07-26 HISTORY — PX: TRANSURETHRAL RESECTION OF BLADDER TUMOR: SHX2575

## 2022-07-26 SURGERY — TURBT (TRANSURETHRAL RESECTION OF BLADDER TUMOR)
Anesthesia: General

## 2022-07-26 MED ORDER — PROMETHAZINE HCL 25 MG/ML IJ SOLN
INTRAMUSCULAR | Status: AC
  Filled 2022-07-26: qty 1

## 2022-07-26 MED ORDER — PROPOFOL 10 MG/ML IV BOLUS
INTRAVENOUS | Status: DC | PRN
  Administered 2022-07-26: 150 mg via INTRAVENOUS

## 2022-07-26 MED ORDER — PHENYLEPHRINE HCL-NACL 20-0.9 MG/250ML-% IV SOLN
INTRAVENOUS | Status: AC
  Filled 2022-07-26: qty 250

## 2022-07-26 MED ORDER — PROMETHAZINE HCL 25 MG/ML IJ SOLN
6.2500 mg | INTRAMUSCULAR | Status: DC | PRN
  Administered 2022-07-26: 6.25 mg via INTRAVENOUS

## 2022-07-26 MED ORDER — DEXAMETHASONE SODIUM PHOSPHATE 10 MG/ML IJ SOLN
INTRAMUSCULAR | Status: AC
  Filled 2022-07-26: qty 1

## 2022-07-26 MED ORDER — HYDROMORPHONE HCL 1 MG/ML IJ SOLN: 0.2500 mg | INTRAMUSCULAR | Status: DC | PRN

## 2022-07-26 MED ORDER — EPHEDRINE SULFATE (PRESSORS) 50 MG/ML IJ SOLN
INTRAMUSCULAR | Status: DC | PRN
  Administered 2022-07-26: 10 mg via INTRAVENOUS

## 2022-07-26 MED ORDER — ORAL CARE MOUTH RINSE: 15.0000 mL | Freq: Once | OROMUCOSAL | Status: AC

## 2022-07-26 MED ORDER — LIDOCAINE HCL (CARDIAC) PF 100 MG/5ML IV SOSY
PREFILLED_SYRINGE | INTRAVENOUS | Status: DC | PRN
  Administered 2022-07-26: 60 mg via INTRAVENOUS

## 2022-07-26 MED ORDER — FENTANYL CITRATE (PF) 100 MCG/2ML IJ SOLN
INTRAMUSCULAR | Status: DC | PRN
  Administered 2022-07-26 (×2): 50 ug via INTRAVENOUS

## 2022-07-26 MED ORDER — SODIUM CHLORIDE 0.9 % IR SOLN
Status: DC | PRN
  Administered 2022-07-26: 1000 mL

## 2022-07-26 MED ORDER — FENTANYL CITRATE (PF) 100 MCG/2ML IJ SOLN
INTRAMUSCULAR | Status: AC
  Filled 2022-07-26: qty 2

## 2022-07-26 MED ORDER — PHENYLEPHRINE HCL (PRESSORS) 10 MG/ML IV SOLN
INTRAVENOUS | Status: DC | PRN
  Administered 2022-07-26 (×2): 160 ug via INTRAVENOUS

## 2022-07-26 MED ORDER — LACTATED RINGERS IV SOLN: INTRAVENOUS | Status: DC

## 2022-07-26 MED ORDER — DEXAMETHASONE SODIUM PHOSPHATE 4 MG/ML IJ SOLN
INTRAMUSCULAR | Status: DC | PRN
  Administered 2022-07-26: 8 mg via INTRAVENOUS

## 2022-07-26 MED ORDER — ONDANSETRON HCL 4 MG/2ML IJ SOLN
INTRAMUSCULAR | Status: DC | PRN
  Administered 2022-07-26: 4 mg via INTRAVENOUS

## 2022-07-26 MED ORDER — CEFAZOLIN SODIUM-DEXTROSE 2-4 GM/100ML-% IV SOLN
2.0000 g | INTRAVENOUS | Status: AC
  Administered 2022-07-26: 2 g via INTRAVENOUS
  Filled 2022-07-26: qty 100

## 2022-07-26 MED ORDER — PROPOFOL 10 MG/ML IV BOLUS
INTRAVENOUS | Status: AC
  Filled 2022-07-26: qty 20

## 2022-07-26 MED ORDER — TRAMADOL HCL 50 MG PO TABS: 50.0000 mg | ORAL_TABLET | Freq: Four times a day (QID) | ORAL | 0 refills | Status: DC | PRN

## 2022-07-26 MED ORDER — EPHEDRINE 5 MG/ML INJ
INTRAVENOUS | Status: AC
Start: 2022-07-26 — End: ?
  Filled 2022-07-26: qty 5

## 2022-07-26 MED ORDER — AMISULPRIDE (ANTIEMETIC) 5 MG/2ML IV SOLN: 10.0000 mg | Freq: Once | INTRAVENOUS | Status: DC | PRN

## 2022-07-26 MED ORDER — CHLORHEXIDINE GLUCONATE 0.12 % MT SOLN
15.0000 mL | Freq: Once | OROMUCOSAL | Status: AC
  Administered 2022-07-26: 15 mL via OROMUCOSAL

## 2022-07-26 MED ORDER — PHENYLEPHRINE 80 MCG/ML (10ML) SYRINGE FOR IV PUSH (FOR BLOOD PRESSURE SUPPORT)
PREFILLED_SYRINGE | INTRAVENOUS | Status: AC
  Filled 2022-07-26: qty 10

## 2022-07-26 MED ORDER — IOPAMIDOL (ISOVUE-300) INJECTION 61%
INTRAVENOUS | Status: DC | PRN
  Administered 2022-07-26: 30 mL via URETHRAL

## 2022-07-26 MED ORDER — ONDANSETRON HCL 4 MG/2ML IJ SOLN
INTRAMUSCULAR | Status: AC
  Filled 2022-07-26: qty 2

## 2022-07-26 SURGICAL SUPPLY — 26 items
BAG COUNTER SPONGE SURGICOUNT (BAG) IMPLANT
BAG DRN RND TRDRP ANRFLXCHMBR (UROLOGICAL SUPPLIES) ×4
BAG SPNG CNTER NS LX DISP (BAG) ×2
BAG URINE DRAIN 2000ML AR STRL (UROLOGICAL SUPPLIES) IMPLANT
BAG URO CATCHER STRL LF (MISCELLANEOUS) ×2 IMPLANT
BULB IRRIG PATHFIND (MISCELLANEOUS) IMPLANT
CATH FOLEY 2WAY SLVR  5CC 18FR (CATHETERS) ×2
CATH FOLEY 2WAY SLVR 5CC 18FR (CATHETERS) IMPLANT
CATH URETL OPEN END 6FR 70 (CATHETERS) IMPLANT
CLOTH BEACON ORANGE TIMEOUT ST (SAFETY) ×2 IMPLANT
DRAPE FOOT SWITCH (DRAPES) ×2 IMPLANT
ELECT REM PT RETURN 15FT ADLT (MISCELLANEOUS) IMPLANT
GLOVE SURG LX 7.5 STRW (GLOVE) ×2
GLOVE SURG LX STRL 7.5 STRW (GLOVE) ×2 IMPLANT
GOWN STRL REUS W/ TWL XL LVL3 (GOWN DISPOSABLE) ×2 IMPLANT
GOWN STRL REUS W/TWL XL LVL3 (GOWN DISPOSABLE) ×2
GUIDEWIRE STR DUAL SENSOR (WIRE) ×2 IMPLANT
KIT TURNOVER KIT A (KITS) IMPLANT
LOOP CUT BIPOLAR 24F LRG (ELECTROSURGICAL) IMPLANT
MANIFOLD NEPTUNE II (INSTRUMENTS) ×2 IMPLANT
NS IRRIG 1000ML POUR BTL (IV SOLUTION) IMPLANT
PACK CYSTO (CUSTOM PROCEDURE TRAY) ×2 IMPLANT
SYR TOOMEY IRRIG 70ML (MISCELLANEOUS) ×2
SYRINGE TOOMEY IRRIG 70ML (MISCELLANEOUS) IMPLANT
TUBING CONNECTING 10 (TUBING) ×2 IMPLANT
TUBING UROLOGY SET (TUBING) ×2 IMPLANT

## 2022-07-26 NOTE — Anesthesia Preprocedure Evaluation (Signed)
Anesthesia Evaluation  Patient identified by MRN, date of birth, ID band Patient awake    Reviewed: Allergy & Precautions, NPO status , Patient's Chart, lab work & pertinent test results  Airway Mallampati: II  TM Distance: >3 FB Neck ROM: Full    Dental no notable dental hx.    Pulmonary neg pulmonary ROS,    Pulmonary exam normal breath sounds clear to auscultation       Cardiovascular hypertension, Pt. on medications Normal cardiovascular exam Rhythm:Regular Rate:Normal     Neuro/Psych  Headaches, negative psych ROS   GI/Hepatic negative GI ROS, Neg liver ROS,   Endo/Other  Hypothyroidism   Renal/GU negative Renal ROS  negative genitourinary   Musculoskeletal  (+) Arthritis , Osteoarthritis,    Abdominal   Peds negative pediatric ROS (+)  Hematology negative hematology ROS (+)   Anesthesia Other Findings Bladder Tumor  Reproductive/Obstetrics negative OB ROS                             Anesthesia Physical  Anesthesia Plan  ASA: 3  Anesthesia Plan: General   Post-op Pain Management:    Induction: Intravenous  PONV Risk Score and Plan: 3 and Ondansetron, Dexamethasone, Midazolam and Treatment may vary due to age or medical condition  Airway Management Planned: LMA  Additional Equipment:   Intra-op Plan:   Post-operative Plan: Extubation in OR  Informed Consent: I have reviewed the patients History and Physical, chart, labs and discussed the procedure including the risks, benefits and alternatives for the proposed anesthesia with the patient or authorized representative who has indicated his/her understanding and acceptance.     Dental advisory given  Plan Discussed with: CRNA and Surgeon  Anesthesia Plan Comments:         Anesthesia Quick Evaluation

## 2022-07-26 NOTE — Transfer of Care (Signed)
Immediate Anesthesia Transfer of Care Note  Patient: Sarah [name]  Procedure(s) Performed: TRANSURETHRAL RESECTION OF BLADDER TUMOR (TURBT) CYSTOSCOPY WITH RETROGRADE PYELOGRAM (Bilateral)  Patient Location: PACU  Anesthesia Type:General  Level of Consciousness: awake  Airway & Oxygen Therapy: Patient Spontanous Breathing  Post-op Assessment: Report given to RN  Post vital signs: stable  Last Vitals:  Vitals Value Taken Time  BP 122/55 07/26/22 1351  Temp    Pulse 71 07/26/22 1353  Resp 14 07/26/22 1353  SpO2 100 % 07/26/22 1353  Vitals shown include unvalidated device data.  Last Pain:  Vitals:   07/26/22 1030  TempSrc: Oral  PainSc: 0-No pain         Complications: No notable events documented.

## 2022-07-26 NOTE — Interval H&P Note (Signed)
History and Physical Interval Note:  07/26/2022 12:12 PM  Sarah [name]  has presented today for surgery, with the diagnosis of BLADDER CANCER.  The various methods of treatment have been discussed with the patient and family. After consideration of risks, benefits and other options for treatment, the patient has consented to  Procedure(s): TRANSURETHRAL RESECTION OF BLADDER TUMOR (TURBT) (N/A) CYSTOSCOPY WITH RETROGRADE PYELOGRAM (Bilateral) as a surgical intervention.  The patient's history has been reviewed, patient examined, no change in status, stable for surgery.  I have reviewed the patient's chart and labs.  Questions were answered to the patient's satisfaction.     Remi Haggard

## 2022-07-26 NOTE — Op Note (Signed)
Preoperative diagnosis:  1.  Bladder cancer  Postoperative diagnosis: 1.  Bladder cancer  Procedure(s): 1.  Cystoscopy, bilateral retrograde with intraoperative interpretation, transurethral section of bladder tumor (medium sized)  Surgeon: Dr. Harold Barban  Anesthesia: General  Complications: None  EBL: Minimal  Specimens: Bladder tumor  Disposition of specimens: To pathology  Intraoperative findings: Approximate 3 to 4 cm exophytic tumor emanating from the right posterior lateral bladder wall superior and lateral to the right ureteral orifice.  Ureteral orifice ease were not involved.  Bilateral retrogrades were normal.  Lesion completely resected with 1 area of fat identified.  67 French Foley placed at termination of the case  Indication: 81 year old white female presented with painless gross hematuria.  Found to have large bladder Tumor on the right posterior lateral bladder wall presents at this time to get a cystoscopy retrograde and TURBT  Description of procedure:  After obtaining informed consent for the patient she was taken major cystoscopy suite placed under general anesthesia.  Placed in the dorsolithotomy position genitalia prepped and draped usual sterile fashion.  Proper pause and timeout was performed.  57 French cystoscope was advanced in the bladder without difficulty.  Patient was found to have exophytic bladder tumor emanating from the right posterior lateral bladder wall superior lateral to the right ureteral orifice disease.  There was some what appeared to be inflammatory areas posterior bladder wall as well but were not suspicious for carcinoma.  5 French open tip catheter was utilized to cannulate both ureteral orifice ease.  Retrograde pyelograms were performed and filled prompt filling of both upper tracts without evidence of filling defect or hydronephrosis.  Both upper tracts emptied out promptly upon removal removal of the retrograde  catheter.  Attention was then directed towards the bladder tumor resection.  The 10 French continuous-flow resectoscope was placed utilizing the direct visualized obturator without difficulty.  Utilizing the bipolar saline resectoscope the tumor was completely resected into muscle.  There was 1 area of fat noted at the base of the lesion.  All areas were cauterized and good hemostasis was noted.  Bladder was emptied and the area reinspected for hemostasis.  Because of the deep area of biopsy was felt a Foley catheter be placed for couple days to allow healing.  92 French Foley was placed in the urine was clear at termination of the case.  Procedure terminated she was awakened from anesthesia and taken back to the recovery room in stable condition.  No immediate complication from the procedure.

## 2022-07-26 NOTE — Anesthesia Procedure Notes (Signed)
Procedure Name: LMA Insertion Date/Time: 07/26/2022 12:36 PM  Performed by: Kimmie Berggren, Forest Gleason, CRNAPre-anesthesia Checklist: Patient identified, Emergency Drugs available, Suction available, Patient being monitored and Timeout performed Patient Re-evaluated:Patient Re-evaluated prior to induction Oxygen Delivery Method: Circle system utilized Preoxygenation: Pre-oxygenation with 100% oxygen Induction Type: IV induction Ventilation: Mask ventilation without difficulty LMA: LMA inserted and LMA with gastric port inserted LMA Size: 4.0 Number of attempts: 1 Dental Injury: Teeth and Oropharynx as per pre-operative assessment

## 2022-07-26 NOTE — Anesthesia Postprocedure Evaluation (Signed)
Anesthesia Post Note  Patient: Sarah [name]  Procedure(s) Performed: TRANSURETHRAL RESECTION OF BLADDER TUMOR (TURBT) CYSTOSCOPY WITH RETROGRADE PYELOGRAM (Bilateral)     Patient location during evaluation: PACU Anesthesia Type: General Level of consciousness: awake and alert Pain management: pain level controlled Vital Signs Assessment: post-procedure vital signs reviewed and stable Respiratory status: spontaneous breathing, nonlabored ventilation and respiratory function stable Cardiovascular status: blood pressure returned to baseline and stable Postop Assessment: no apparent nausea or vomiting Anesthetic complications: no   No notable events documented.  Last Vitals:  Vitals:   07/26/22 1452 07/26/22 1502  BP: 139/66 138/71  Pulse: 73 (!) 56  Resp: 16 16  Temp: (!) 36.4 C (!) 36.3 C  SpO2: 97% 100%    Last Pain:  Vitals:   07/26/22 1502  TempSrc:   PainSc: Orange

## 2022-07-27 ENCOUNTER — Encounter (HOSPITAL_COMMUNITY): Payer: Self-pay | Admitting: Urology

## 2022-07-27 LAB — SURGICAL PATHOLOGY

## 2022-08-10 ENCOUNTER — Encounter: Payer: Self-pay | Admitting: Neurology

## 2022-08-10 ENCOUNTER — Ambulatory Visit: Payer: Medicare Other | Admitting: Neurology

## 2022-08-10 VITALS — BP 145/68 | HR 65 | Ht 67.0 in | Wt 152.0 lb

## 2022-08-10 DIAGNOSIS — R55 Syncope and collapse: Secondary | ICD-10-CM

## 2022-08-10 MED ORDER — OXCARBAZEPINE 150 MG PO TABS: 150.0000 mg | ORAL_TABLET | Freq: Two times a day (BID) | ORAL | 3 refills | Status: DC

## 2022-08-10 NOTE — Patient Instructions (Signed)
Continue on the Trileptal  I reviewed your recent labs Call for any issues See you back in 1 year

## 2022-08-10 NOTE — Progress Notes (Signed)
Patient: Sarah [name] Date of Birth: [birth year]  Reason for Visit: Follow up History from: Patient Primary Neurologist: Dr. Brett Fairy  ASSESSMENT AND PLAN 81 y.o. year old female   66.  Recurrent syncope -No further syncopal events since starting Trileptal -I reviewed recent labs -Continue Trileptal 150 mg twice a day -Call for any spells, otherwise follow-up in 1 year  HISTORY OF PRESENT ILLNESS: Today 08/10/22 Sarah [name] is here today for follow-up. Remains on Trileptal 150 mg twice daily. No more spells of syncope. Denies any side effects. Has been found to have bladder cancer. Had tumors removed 2 weeks ago, is going to undergo some form of chemotherapy. She feels good. Lives with her husband, he is blind. Labs from 07/20/22 show sodium level 135, creatinine 0.58, CBC was unremarkable.  Trileptal level was 4 in Feb 2022.  HISTORY  08/06/21 Dr. Jannifer Franklin: Ms. Derringer is a 81 year old right-handed white female with a history of recurrent episodes of syncope.  The patient is on a very low-dose of Trileptal taking 150 milligrams twice daily.  She has not had any recurrence of symptoms since last seen.  The blood levels are quite low with the Trileptal but it seems to be effective for her.  The patient is being treated for presumed seizures as syncopal episodes on cardiac monitor have not shown any cardiac rhythm abnormalities.  The patient gets about 4 hours of sleep at night but this is adequate for her, she has always done this.  She has good energy level during the day.  She returns for an evaluation at this time.  She is operating a motor vehicle.  REVIEW OF SYSTEMS: Out of a complete 14 system review of symptoms, the patient complains only of the following symptoms, and all other reviewed systems are negative.  See HPI  ALLERGIES: Allergies  Allergen Reactions   Atorvastatin Other (See Comments)    Fatigue, leg cramps   Keppra [Levetiracetam]     jitteriness   Other      Band-Aid cause patient to swell   Oxycodone-Acetaminophen Nausea And Vomiting   Tape Swelling and Other (See Comments)    redness   Penicillins Hives    Did it involve swelling of the face/tongue/throat, SOB, or low BP? No Did it involve sudden or severe rash/hives, skin peeling, or any reaction on the inside of your mouth or nose? Yes Did you need to seek medical attention at a hospital or doctor's office? Yes When did it last happen?      10+ years If all above answers are "NO", may proceed with cephalosporin use. Tolerated Ancef without difficulty on 26 Jul 2022   Voltaren [Diclofenac Sodium] Hives and Rash    HOME MEDICATIONS: Outpatient Medications Prior to Visit  Medication Sig Dispense Refill   acetaminophen (TYLENOL) 500 MG tablet Take 500-1,000 mg by mouth every 6 (six) hours as needed for moderate pain or headache.     Artificial Tear Solution (SOOTHE XP OP) Place 1 drop into both eyes daily as needed (dry eyes).     b complex vitamins tablet Take 1 tablet by mouth daily.     Calcium Citrate-Vitamin D (CALCIUM + D PO) Take 1 tablet by mouth daily.     Carboxymethylcellul-Glycerin (LUBRICATING EYE DROPS OP) Place 1 drop into both eyes daily as needed (dry eyes).     cyanocobalamin (VITAMIN B12) 500 MCG tablet Take 500 mcg by mouth daily.     estradiol (ESTRACE) 1 MG tablet  Take 0.5 mg by mouth daily with breakfast.      Ferrous Gluconate (IRON) 240 (27 Fe) MG TABS Take 240 mg by mouth daily.     fluticasone (FLONASE) 50 MCG/ACT nasal spray Place 1 spray into both nostrils as needed for allergies.      levothyroxine (SYNTHROID, LEVOTHROID) 75 MCG tablet Take 75 mcg by mouth every morning.      montelukast (SINGULAIR) 10 MG tablet Take 10 mg by mouth daily.     traMADol (ULTRAM) 50 MG tablet Take 1 tablet (50 mg total) by mouth every 6 (six) hours as needed. 20 tablet 0   OXcarbazepine (TRILEPTAL) 150 MG tablet TAKE ONE TABLET BY MOUTH TWICE A DAY 180 tablet 1   No  facility-administered medications prior to visit.    PAST MEDICAL HISTORY: Past Medical History:  Diagnosis Date   Anemia    Cancer ([place])    Degenerative arthritis    Headache(784.0)    sinus   Hypertension    Hypothyroidism    Obesity    Syncope, near 1 year ago    PAST SURGICAL HISTORY: Past Surgical History:  Procedure Laterality Date   ABDOMINAL HYSTERECTOMY     partial   arthroscopic knee surgery  July 03, 2013   CARDIAC CATHETERIZATION N/A 09/26/2016   Procedure: Left Heart Cath and Coronary Angiography;  Surgeon: Jettie Booze, MD;  Location: [facility];  Service: Cardiovascular;  Laterality: N/A;   CARPAL TUNNEL RELEASE Bilateral    CATARACT EXTRACTION, BILATERAL     CERVICAL SPINE SURGERY     COLONOSCOPY WITH PROPOFOL N/A 12/20/2019   Procedure: COLONOSCOPY WITH PROPOFOL;  Surgeon: Carol Ada, MD;  Location: [facility];  Service: Endoscopy;  Laterality: N/A;   cyst removed from eyebrow  4-5 yrs ago   CYSTOSCOPY W/ RETROGRADES Bilateral 07/26/2022   Procedure: CYSTOSCOPY WITH RETROGRADE PYELOGRAM;  Surgeon: Remi Haggard, MD;  Location: [facility];  Service: Urology;  Laterality: Bilateral;   HEMOSTASIS CLIP PLACEMENT  12/20/2019   Procedure: HEMOSTASIS CLIP PLACEMENT;  Surgeon: Carol Ada, MD;  Location: [facility];  Service: Endoscopy;;   LUMBAR SPINE SURGERY     POLYPECTOMY  12/20/2019   Procedure: POLYPECTOMY;  Surgeon: Carol Ada, MD;  [facility]: [facility];  Service: Endoscopy;;   SUBMUCOSAL LIFTING INJECTION  12/20/2019   Procedure: SUBMUCOSAL LIFTING INJECTION;  Surgeon: Carol Ada, MD;  Location: [facility];  Service: Endoscopy;;   TONSILLECTOMY  age 32 or 83   TOTAL KNEE ARTHROPLASTY Right 12/18/2013   Procedure: RIGHT TOTAL KNEE ARTHROPLASTY;  Surgeon: Tobi Bastos, MD;  Location: [facility];  Service: Orthopedics;  Laterality: Right;   TRANSURETHRAL RESECTION OF BLADDER TUMOR N/A 07/26/2022   Procedure: TRANSURETHRAL RESECTION  OF BLADDER TUMOR (TURBT);  Surgeon: Remi Haggard, MD;  Location: [facility];  Service: Urology;  Laterality: N/A;    FAMILY HISTORY: Family History  Problem Relation Age of Onset   Alzheimer's disease Sister    Cancer - Lung Sister    Heart disease Brother    Heart disease Brother    Heart disease Brother    Cancer Brother        Bladder cancer    SOCIAL HISTORY: Social History   Socioeconomic History   Marital status: Married    Spouse name: Not on file   Number of children: 3   Years of education: 12   Highest education level: Not on file  Occupational History   Not on file  Tobacco Use   Smoking status: Never   Smokeless tobacco: Never  Vaping Use   Vaping Use: Never used  Substance and Sexual Activity   Alcohol use: No   Drug use: No   Sexual activity: Not on file  Other Topics Concern   Not on file  Social History Narrative   Not on file   Social Determinants of Health   Financial Resource Strain: Not on file  Food Insecurity: Not on file  Transportation Needs: Not on file  Physical Activity: Not on file  Stress: Not on file  Social Connections: Not on file  Intimate Partner Violence: Not on file    PHYSICAL EXAM  Vitals:   08/10/22 1515  BP: (!) 145/68  Pulse: 65  Weight: 152 lb (68.9 kg)  Height: '5\' 7"'$  (1.702 m)   Body mass index is 23.81 kg/m.  Generalized: Well developed, in no acute distress  Neurological examination  Mentation: Alert oriented to time, place, history taking. Follows all commands speech and language fluent Cranial nerve II-XII: Pupils were equal round reactive to light. Extraocular movements were full, visual field were full on confrontational test. Facial sensation and strength were normal. Head turning and shoulder shrug  were normal and symmetric. Motor: The motor testing reveals 5 over 5 strength of all 4 extremities. Good symmetric motor tone is noted throughout.  Sensory: Sensory testing is intact to soft touch on  all 4 extremities. No evidence of extinction is noted.  Coordination: Cerebellar testing reveals good finger-nose-finger and heel-to-shin bilaterally.  Gait and station: Gait is slightly wide-based, but steady and independent Reflexes: Deep tendon reflexes are symmetric and normal bilaterally.   DIAGNOSTIC DATA (LABS, IMAGING, TESTING) - I reviewed patient records, labs, notes, testing and imaging myself where available.  Lab Results  Component Value Date   WBC 6.1 07/20/2022   HGB 12.5 07/20/2022   HCT 37.7 07/20/2022   MCV 91.7 07/20/2022   PLT 189 07/20/2022      Component Value Date/Time   NA 135 07/20/2022 1430   NA 140 01/06/2021 1447   K 4.0 07/20/2022 1430   CL 104 07/20/2022 1430   CO2 27 07/20/2022 1430   GLUCOSE 94 07/20/2022 1430   BUN 11 07/20/2022 1430   BUN 11 01/06/2021 1447   CREATININE 0.58 07/20/2022 1430   CALCIUM 9.1 07/20/2022 1430   PROT 6.6 01/06/2021 1447   ALBUMIN 3.8 01/06/2021 1447   AST 19 01/06/2021 1447   ALT 13 01/06/2021 1447   ALKPHOS 80 01/06/2021 1447   BILITOT <0.2 01/06/2021 1447   GFRNONAA >60 07/20/2022 1430   GFRAA 94 01/06/2021 1447   Lab Results  Component Value Date   CHOL 166 09/24/2016   HDL 54 09/24/2016   LDLCALC 103 (H) 09/24/2016   TRIG 46 09/24/2016   CHOLHDL 3.1 09/24/2016   Lab Results  Component Value Date   HGBA1C 5.3 09/24/2016   No results found for: "VITAMINB12" Lab Results  Component Value Date   TSH 1.205 09/24/2016    Butler Denmark, AGNP-C, DNP 08/10/2022, 3:38 PM [facility] [address] [place], [place] [address] [phone]

## 2022-12-02 ENCOUNTER — Encounter (HOSPITAL_COMMUNITY): Payer: Self-pay | Admitting: Emergency Medicine

## 2022-12-02 ENCOUNTER — Emergency Department (HOSPITAL_COMMUNITY): Payer: Medicare Other

## 2022-12-02 ENCOUNTER — Inpatient Hospital Stay (HOSPITAL_COMMUNITY)
Admission: EM | Admit: 2022-12-02 | Discharge: 2022-12-05 | DRG: 522 | Disposition: A | Discharge status: Home Health | Payer: Medicare Other | Attending: Internal Medicine | Admitting: Internal Medicine

## 2022-12-02 ENCOUNTER — Other Ambulatory Visit: Payer: Self-pay

## 2022-12-02 ENCOUNTER — Inpatient Hospital Stay (HOSPITAL_COMMUNITY): Payer: Medicare Other

## 2022-12-02 DIAGNOSIS — Z90710 Acquired absence of both cervix and uterus: Secondary | ICD-10-CM

## 2022-12-02 DIAGNOSIS — W109XXA Fall (on) (from) unspecified stairs and steps, initial encounter: Secondary | ICD-10-CM | POA: Diagnosis present

## 2022-12-02 DIAGNOSIS — E669 Obesity, unspecified: Secondary | ICD-10-CM | POA: Diagnosis present

## 2022-12-02 DIAGNOSIS — Z8673 Personal history of transient ischemic attack (TIA), and cerebral infarction without residual deficits: Secondary | ICD-10-CM

## 2022-12-02 DIAGNOSIS — Z880 Allergy status to penicillin: Secondary | ICD-10-CM | POA: Diagnosis not present

## 2022-12-02 DIAGNOSIS — E611 Iron deficiency: Secondary | ICD-10-CM | POA: Diagnosis present

## 2022-12-02 DIAGNOSIS — E871 Hypo-osmolality and hyponatremia: Secondary | ICD-10-CM | POA: Diagnosis present

## 2022-12-02 DIAGNOSIS — D72829 Elevated white blood cell count, unspecified: Secondary | ICD-10-CM | POA: Diagnosis present

## 2022-12-02 DIAGNOSIS — M25751 Osteophyte, right hip: Secondary | ICD-10-CM | POA: Diagnosis present

## 2022-12-02 DIAGNOSIS — Z79899 Other long term (current) drug therapy: Secondary | ICD-10-CM | POA: Diagnosis not present

## 2022-12-02 DIAGNOSIS — Z881 Allergy status to other antibiotic agents status: Secondary | ICD-10-CM | POA: Diagnosis not present

## 2022-12-02 DIAGNOSIS — Z96651 Presence of right artificial knee joint: Secondary | ICD-10-CM | POA: Diagnosis present

## 2022-12-02 DIAGNOSIS — I10 Essential (primary) hypertension: Secondary | ICD-10-CM | POA: Diagnosis present

## 2022-12-02 DIAGNOSIS — D62 Acute posthemorrhagic anemia: Secondary | ICD-10-CM | POA: Diagnosis not present

## 2022-12-02 DIAGNOSIS — E039 Hypothyroidism, unspecified: Secondary | ICD-10-CM | POA: Diagnosis present

## 2022-12-02 DIAGNOSIS — Z6823 Body mass index (BMI) 23.0-23.9, adult: Secondary | ICD-10-CM

## 2022-12-02 DIAGNOSIS — Z888 Allergy status to other drugs, medicaments and biological substances status: Secondary | ICD-10-CM | POA: Diagnosis not present

## 2022-12-02 DIAGNOSIS — M1990 Unspecified osteoarthritis, unspecified site: Secondary | ICD-10-CM | POA: Diagnosis present

## 2022-12-02 DIAGNOSIS — G40909 Epilepsy, unspecified, not intractable, without status epilepticus: Secondary | ICD-10-CM | POA: Diagnosis present

## 2022-12-02 DIAGNOSIS — S72011A Unspecified intracapsular fracture of right femur, initial encounter for closed fracture: Principal | ICD-10-CM | POA: Diagnosis present

## 2022-12-02 DIAGNOSIS — S72001A Fracture of unspecified part of neck of right femur, initial encounter for closed fracture: Principal | ICD-10-CM | POA: Diagnosis present

## 2022-12-02 DIAGNOSIS — Z885 Allergy status to narcotic agent status: Secondary | ICD-10-CM

## 2022-12-02 DIAGNOSIS — I2510 Atherosclerotic heart disease of native coronary artery without angina pectoris: Secondary | ICD-10-CM | POA: Diagnosis present

## 2022-12-02 DIAGNOSIS — M25572 Pain in left ankle and joints of left foot: Secondary | ICD-10-CM | POA: Diagnosis present

## 2022-12-02 DIAGNOSIS — R55 Syncope and collapse: Secondary | ICD-10-CM | POA: Diagnosis present

## 2022-12-02 DIAGNOSIS — Z79890 Hormone replacement therapy (postmenopausal): Secondary | ICD-10-CM | POA: Diagnosis not present

## 2022-12-02 DIAGNOSIS — Z91048 Other nonmedicinal substance allergy status: Secondary | ICD-10-CM | POA: Diagnosis not present

## 2022-12-02 DIAGNOSIS — Z8249 Family history of ischemic heart disease and other diseases of the circulatory system: Secondary | ICD-10-CM

## 2022-12-02 DIAGNOSIS — Z981 Arthrodesis status: Secondary | ICD-10-CM

## 2022-12-02 LAB — HEPATIC FUNCTION PANEL
ALT: 16 U/L (ref 0–44)
AST: 20 U/L (ref 15–41)
Albumin: 3.4 g/dL — ABNORMAL LOW (ref 3.5–5.0)
Alkaline Phosphatase: 59 U/L (ref 38–126)
Bilirubin, Direct: 0.1 mg/dL (ref 0.0–0.2)
Indirect Bilirubin: 0.7 mg/dL (ref 0.3–0.9)
Total Bilirubin: 0.8 mg/dL (ref 0.3–1.2)
Total Protein: 6.4 g/dL — ABNORMAL LOW (ref 6.5–8.1)

## 2022-12-02 LAB — BASIC METABOLIC PANEL
Anion gap: 7 (ref 5–15)
BUN: 10 mg/dL (ref 8–23)
CO2: 22 mmol/L (ref 22–32)
Calcium: 8.6 mg/dL — ABNORMAL LOW (ref 8.9–10.3)
Chloride: 102 mmol/L (ref 98–111)
Creatinine, Ser: 0.58 mg/dL (ref 0.44–1.00)
GFR, Estimated: >60 mL/min (ref >60)
Glucose, Bld: 115 mg/dL — ABNORMAL HIGH (ref 70–99)
Potassium: 4.1 mmol/L (ref 3.5–5.1)
Sodium: 131 mmol/L — ABNORMAL LOW (ref 135–145)

## 2022-12-02 LAB — CBC
HCT: 37.3 % (ref 36.0–46.0)
Hemoglobin: 12.5 g/dL (ref 12.0–15.0)
MCH: 30.5 pg (ref 26.0–34.0)
MCHC: 33.5 g/dL (ref 30.0–36.0)
MCV: 91 fL (ref 80.0–100.0)
Platelets: 160 10*3/uL (ref 150–400)
RBC: 4.1 MIL/uL (ref 3.87–5.11)
RDW: 11.8 % (ref 11.5–15.5)
WBC: 12.9 10*3/uL — ABNORMAL HIGH (ref 4.0–10.5)
nRBC: 0 % (ref 0.0–0.2)

## 2022-12-02 LAB — PROTIME-INR
INR: 1 (ref 0.8–1.2)
Prothrombin Time: 13.5 seconds (ref 11.4–15.2)

## 2022-12-02 LAB — TYPE AND SCREEN
ABO/RH(D): O POS
Antibody Screen: NEGATIVE

## 2022-12-02 LAB — VITAMIN D 25 HYDROXY (VIT D DEFICIENCY, FRACTURES): Vit D, 25-Hydroxy: 38.23 ng/mL (ref 30–100)

## 2022-12-02 MED ORDER — CHLORHEXIDINE GLUCONATE 4 % EX LIQD
60.0000 mL | Freq: Once | CUTANEOUS | Status: AC
  Administered 2022-12-03: 4 via TOPICAL
  Filled 2022-12-02: qty 60

## 2022-12-02 MED ORDER — SOOTHE XP OP SOLN: Freq: Every day | OPHTHALMIC | Status: DC | PRN

## 2022-12-02 MED ORDER — FLUTICASONE PROPIONATE 50 MCG/ACT NA SUSP: 1.0000 | Freq: Every day | NASAL | Status: DC | PRN

## 2022-12-02 MED ORDER — LEVOTHYROXINE SODIUM 75 MCG PO TABS
75.0000 ug | ORAL_TABLET | ORAL | Status: DC
  Administered 2022-12-03 – 2022-12-05 (×3): 75 ug via ORAL
  Filled 2022-12-02 (×3): qty 1

## 2022-12-02 MED ORDER — POVIDONE-IODINE 10 % EX SWAB
2.0000 | Freq: Once | CUTANEOUS | Status: AC
  Administered 2022-12-03: 2 via TOPICAL

## 2022-12-02 MED ORDER — SENNOSIDES-DOCUSATE SODIUM 8.6-50 MG PO TABS: 1.0000 | ORAL_TABLET | Freq: Every evening | ORAL | Status: DC | PRN

## 2022-12-02 MED ORDER — TRANEXAMIC ACID-NACL 1000-0.7 MG/100ML-% IV SOLN
1000.0000 mg | INTRAVENOUS | Status: AC
  Administered 2022-12-03: 1000 mg via INTRAVENOUS

## 2022-12-02 MED ORDER — SODIUM CHLORIDE 0.9 % IV SOLN: INTRAVENOUS | Status: AC

## 2022-12-02 MED ORDER — FENTANYL CITRATE PF 50 MCG/ML IJ SOSY
50.0000 ug | PREFILLED_SYRINGE | Freq: Once | INTRAMUSCULAR | Status: AC
  Administered 2022-12-02: 50 ug via INTRAVENOUS
  Filled 2022-12-02: qty 1

## 2022-12-02 MED ORDER — MORPHINE SULFATE (PF) 2 MG/ML IV SOLN
1.0000 mg | INTRAVENOUS | Status: DC | PRN
  Administered 2022-12-02 – 2022-12-03 (×3): 1 mg via INTRAVENOUS
  Filled 2022-12-02 (×3): qty 1

## 2022-12-02 MED ORDER — FERROUS SULFATE 325 (65 FE) MG PO TABS
325.0000 mg | ORAL_TABLET | Freq: Every day | ORAL | Status: DC
  Administered 2022-12-02 – 2022-12-05 (×4): 325 mg via ORAL
  Filled 2022-12-02 (×4): qty 1

## 2022-12-02 MED ORDER — VANCOMYCIN HCL IN DEXTROSE 1-5 GM/200ML-% IV SOLN
1000.0000 mg | INTRAVENOUS | Status: AC
  Administered 2022-12-03: 1000 mg via INTRAVENOUS

## 2022-12-02 MED ORDER — ONDANSETRON HCL 4 MG/2ML IJ SOLN
4.0000 mg | Freq: Once | INTRAMUSCULAR | Status: AC
  Administered 2022-12-02: 4 mg via INTRAVENOUS
  Filled 2022-12-02: qty 2

## 2022-12-02 MED ORDER — ESTRADIOL 0.5 MG PO TABS
0.5000 mg | ORAL_TABLET | Freq: Every day | ORAL | Status: DC
  Administered 2022-12-03 – 2022-12-05 (×3): 0.5 mg via ORAL
  Filled 2022-12-02 (×3): qty 1

## 2022-12-02 MED ORDER — OXCARBAZEPINE 150 MG PO TABS
150.0000 mg | ORAL_TABLET | Freq: Two times a day (BID) | ORAL | Status: DC
  Administered 2022-12-02 – 2022-12-05 (×6): 150 mg via ORAL
  Filled 2022-12-02 (×7): qty 1

## 2022-12-02 NOTE — H&P (View-Only) (Signed)
Reason for Consult:Right hip fx Referring Physician: Lajean Saver Time called: 1940 Time at bedside: [place] is an 81 y.o. female.  HPI: Sarah [name] was at home on her chair lift. She got off and somehow tripped on the stair and fell. She had immediate right hip pain and could not get up. She was brought to the ED where x-rays showed a right hip fx and orthopedic surgery was consulted. She lives at home with her blind husband and does not use any assistive devices to ambulate.  Past Medical History:  Diagnosis Date   Anemia    Cancer ([place])    Degenerative arthritis    Headache(784.0)    sinus   Hypertension    Hypothyroidism    Obesity    Syncope, near 1 year ago    Past Surgical History:  Procedure Laterality Date   ABDOMINAL HYSTERECTOMY     partial   arthroscopic knee surgery  July 03, 2013   CARDIAC CATHETERIZATION N/A 09/26/2016   Procedure: Left Heart Cath and Coronary Angiography;  Surgeon: Jettie Booze, MD;  Location: [facility];  Service: Cardiovascular;  Laterality: N/A;   CARPAL TUNNEL RELEASE Bilateral    CATARACT EXTRACTION, BILATERAL     CERVICAL SPINE SURGERY     COLONOSCOPY WITH PROPOFOL N/A 12/20/2019   Procedure: COLONOSCOPY WITH PROPOFOL;  Surgeon: Carol Ada, MD;  [facility];  Service: Endoscopy;  Laterality: N/A;   cyst removed from eyebrow  4-5 yrs ago   CYSTOSCOPY W/ RETROGRADES Bilateral 07/26/2022   Procedure: CYSTOSCOPY WITH RETROGRADE PYELOGRAM;  Surgeon: Remi Haggard, MD;  [facility];  Service: Urology;  Laterality: Bilateral;   HEMOSTASIS CLIP PLACEMENT  12/20/2019   Procedure: HEMOSTASIS CLIP PLACEMENT;  Surgeon: Carol Ada, MD;  [facility];  Service: Endoscopy;;   LUMBAR SPINE SURGERY     POLYPECTOMY  12/20/2019   Procedure: POLYPECTOMY;  Surgeon: Carol Ada, MD;  [facility];  Service: Endoscopy;;   SUBMUCOSAL LIFTING INJECTION  12/20/2019   Procedure: SUBMUCOSAL  LIFTING INJECTION;  Surgeon: Carol Ada, MD;  [facility];  Service: Endoscopy;;   TONSILLECTOMY  age 72 or 39   TOTAL KNEE ARTHROPLASTY Right 12/18/2013   Procedure: RIGHT TOTAL KNEE ARTHROPLASTY;  Surgeon: Tobi Bastos, MD;  [facility];  Service: Orthopedics;  Laterality: Right;   TRANSURETHRAL RESECTION OF BLADDER TUMOR N/A 07/26/2022   Procedure: TRANSURETHRAL RESECTION OF BLADDER TUMOR (TURBT);  Surgeon: Remi Haggard, MD;  [facility]: [facility];  Service: Urology;  Laterality: N/A;    Family History  Problem Relation Age of Onset   Alzheimer's disease Sister    Cancer - Lung Sister    Heart disease Brother    Heart disease Brother    Heart disease Brother    Cancer Brother        Bladder cancer    Social History:  reports that she has never smoked. She has never used smokeless tobacco. She reports that she does not drink alcohol and does not use drugs.  Allergies:  Allergies  Allergen Reactions   Atorvastatin Other (See Comments)    Fatigue, leg cramps   Keppra [Levetiracetam]     jitteriness   Other     Band-Aid cause patient to swell   Oxycodone-Acetaminophen Nausea And Vomiting   Tape Swelling and Other (See Comments)    redness   Penicillins Hives    Did it involve swelling of the face/tongue/throat, SOB,  or low BP? No Did it involve sudden or severe rash/hives, skin peeling, or any reaction on the inside of your mouth or nose? Yes Did you need to seek medical attention at a hospital or doctor's office? Yes When did it last happen?      10+ years If all above answers are "NO", may proceed with cephalosporin use. Tolerated Ancef without difficulty on 26 Jul 2022   Voltaren [Diclofenac Sodium] Hives and Rash    Medications: I have reviewed the patient's current medications.  No results found for this or any previous visit (from the past 48 hour(s)).  DG Chest 2 View  Result Date: 12/02/2022 CLINICAL DATA:  Chest pain.  Fall from chair  lift. EXAM: CHEST - 2 VIEW COMPARISON:  Two-view chest x-ray 09/23/2016 FINDINGS: Heart size is normal. 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 No superimposed airspace disease is present. No edema or effusion is present. Scarring at the lung apices is similar the prior study. IMPRESSION: 1. No acute cardiopulmonary disease. 2. 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 Electronically Signed   By: San Morelle M.D.   On: 12/02/2022 12:34   CT Cervical Spine Wo Contrast  Result Date: 12/02/2022 CLINICAL DATA:  Neck trauma (Age >= 65y); Head trauma, moderate-severe EXAM: CT HEAD WITHOUT CONTRAST CT CERVICAL SPINE WITHOUT CONTRAST TECHNIQUE: Multidetector CT imaging of the head and cervical spine was performed following the standard protocol without intravenous contrast. Multiplanar CT image reconstructions of the cervical spine were also generated. RADIATION DOSE REDUCTION: This exam was performed according to the departmental dose-optimization program which includes automated exposure control, adjustment of the mA and/or kV according to patient size and/or use of iterative reconstruction technique. COMPARISON:  04/17/2020 FINDINGS: CT HEAD FINDINGS Brain: No evidence of acute infarction, hemorrhage, hydrocephalus, extra-axial collection or mass lesion/mass effect. Scattered low-density changes within the periventricular and subcortical white matter compatible with chronic microvascular ischemic change. Mild diffuse cerebral volume loss. Vascular: Atherosclerotic calcifications involving the large vessels of the skull base. No unexpected hyperdense vessel. Skull: Normal. Negative for fracture or focal lesion. Sinuses/Orbits: No acute finding. Other: None. CT CERVICAL SPINE FINDINGS Alignment: Facet joints are aligned without dislocation or traumatic listhesis. Dens and lateral masses are aligned. Mild grade 1 anterolisthesis with focal kyphosis at C7-T1 is unchanged. Skull  base and vertebrae: Extensive interbody fusion throughout the cervical spine with hardware present at the C3-C5 levels and evidence of prior hardware removal at C6 and C7. Solid interbody fusion from C2 to C7. No evidence of fracture. No suspicious lytic or sclerotic bone lesion. Soft tissues and spinal canal: No prevertebral fluid or swelling. No visible canal hematoma. Disc levels: Interbody fusion of the cervical spine with posterior element hypertrophy. Upper chest: Patchy opacities at the bilateral lung apices with interlobular septal thickening. Other: None. IMPRESSION: 1. No acute intracranial abnormality. 2. No evidence of acute fracture or subluxation of the cervical spine. 3. Patchy opacities at the bilateral lung apices which with interlobular septal thickening, which may represent pulmonary edema or multifocal infection. Follow-up chest radiographs recommended to further assess. Electronically Signed   By: Davina Poke D.O.   On: 12/02/2022 11:47   CT Head Wo Contrast  Result Date: 12/02/2022 CLINICAL DATA:  Neck trauma (Age >= 65y); Head trauma, moderate-severe EXAM: CT HEAD WITHOUT CONTRAST CT CERVICAL SPINE WITHOUT CONTRAST TECHNIQUE: Multidetector CT imaging of the head and cervical spine was performed following the standard protocol without intravenous contrast. Multiplanar CT image reconstructions of the  cervical spine were also generated. RADIATION DOSE REDUCTION: This exam was performed according to the departmental dose-optimization program which includes automated exposure control, adjustment of the mA and/or kV according to patient size and/or use of iterative reconstruction technique. COMPARISON:  04/17/2020 FINDINGS: CT HEAD FINDINGS Brain: No evidence of acute infarction, hemorrhage, hydrocephalus, extra-axial collection or mass lesion/mass effect. Scattered low-density changes within the periventricular and subcortical white matter compatible with chronic microvascular ischemic  change. Mild diffuse cerebral volume loss. Vascular: Atherosclerotic calcifications involving the large vessels of the skull base. No unexpected hyperdense vessel. Skull: Normal. Negative for fracture or focal lesion. Sinuses/Orbits: No acute finding. Other: None. CT CERVICAL SPINE FINDINGS Alignment: Facet joints are aligned without dislocation or traumatic listhesis. Dens and lateral masses are aligned. Mild grade 1 anterolisthesis with focal kyphosis at C7-T1 is unchanged. Skull base and vertebrae: Extensive interbody fusion throughout the cervical spine with hardware present at the C3-C5 levels and evidence of prior hardware removal at C6 and C7. Solid interbody fusion from C2 to C7. No evidence of fracture. No suspicious lytic or sclerotic bone lesion. Soft tissues and spinal canal: No prevertebral fluid or swelling. No visible canal hematoma. Disc levels: Interbody fusion of the cervical spine with posterior element hypertrophy. Upper chest: Patchy opacities at the bilateral lung apices with interlobular septal thickening. Other: None. IMPRESSION: 1. No acute intracranial abnormality. 2. No evidence of acute fracture or subluxation of the cervical spine. 3. Patchy opacities at the bilateral lung apices which with interlobular septal thickening, which may represent pulmonary edema or multifocal infection. Follow-up chest radiographs recommended to further assess. Electronically Signed   By: Davina Poke D.O.   On: 12/02/2022 11:47   DG Hip Unilat W or Wo Pelvis 2-3 Views Right  Result Date: 12/02/2022 CLINICAL DATA:  Fall.  Right hip pain. EXAM: DG HIP (WITH OR WITHOUT PELVIS) 2-3V RIGHT COMPARISON:  CT abdomen and pelvis 06/29/2022 FINDINGS: There is shortening of the right femoral neck consistent with an impacted subcapital fracture without significant displacement. There is no dislocation. Mild hip joint space narrowing and marginal spurring are noted bilaterally. Sequelae of previous lumbar fusion  are partially visualized. IMPRESSION: Impacted right femoral neck fracture. Electronically Signed   By: Logan Bores M.D.   On: 12/02/2022 11:33   DG Ankle Complete Left  Result Date: 12/02/2022 CLINICAL DATA:  Status post fall EXAM: LEFT ANKLE COMPLETE - 3+ VIEW COMPARISON:  None Available. FINDINGS: There is ankle soft tissue swelling. No evidence of acute fracture. There is a well corticated bony fragment adjacent to the tip of the medial malleolus, consistent with chronic/prior injury. Plantar calcaneal spurring. Mild midfoot degenerative changes. IMPRESSION: Ankle soft tissue swelling without evidence of acute fracture. Evidence of old medial ankle injury. Electronically Signed   By: Maurine Simmering M.D.   On: 12/02/2022 11:33    Review of Systems  HENT:  Negative for ear discharge, ear pain, hearing loss and tinnitus.   Eyes:  Negative for photophobia and pain.  Respiratory:  Negative for cough and shortness of breath.   Cardiovascular:  Negative for chest pain.  Gastrointestinal:  Negative for abdominal pain, nausea and vomiting.  Genitourinary:  Negative for dysuria, flank pain, frequency and urgency.  Musculoskeletal:  Positive for arthralgias (Right hip, left ankle). Negative for back pain, myalgias and neck pain.  Neurological:  Negative for dizziness and headaches.  Hematological:  Does not bruise/bleed easily.  Psychiatric/Behavioral:  The patient is not nervous/anxious.    Blood pressure Marland Kitchen)  124/42, pulse 61, temperature 97.6 F (36.4 C), temperature source Oral, resp. rate 16, SpO2 98 %. Physical Exam Constitutional:      General: She is not in acute distress.    Appearance: She is well-developed. She is not diaphoretic.  HENT:     Head: Normocephalic and atraumatic.  Eyes:     General: No scleral icterus.       Right eye: No discharge.        Left eye: No discharge.     Conjunctiva/sclera: Conjunctivae normal.  Cardiovascular:     Rate and Rhythm: Normal rate and regular  rhythm.  Pulmonary:     Effort: Pulmonary effort is normal. No respiratory distress.  Musculoskeletal:     Cervical back: Normal range of motion.     Comments: RLE No traumatic wounds, ecchymosis, or rash  Nontender  No knee or ankle effusion  Knee stable to varus/ valgus and anterior/posterior stress  Sens DPN, SPN, TN intact  Motor EHL, ext, flex, evers 5/5  DP 2+, PT 1+, No significant edema  LLE No traumatic wounds, ecchymosis, or rash  Diffuse minimal ankle TTP  No knee or ankle effusion  Knee stable to varus/ valgus and anterior/posterior stress  Sens DPN, SPN, TN intact  Motor EHL, ext, flex, evers 5/5  DP 2+, PT 1+, No significant edema  Skin:    General: Skin is warm and dry.  Neurological:     Mental Status: She is alert.  Psychiatric:        Mood and Affect: Mood normal.        Behavior: Behavior normal.     Assessment/Plan: Left hip fx -- Will get CT for operative planning, fx may be amenable to cannulated hip pinning. Otherwise will need THA. Please keep NPO for now.    Lisette Abu, PA-C Orthopedic Surgery [phone] 12/02/2022, 1:06 PM

## 2022-12-02 NOTE — Consult Note (Signed)
Reason for Consult:Right hip fx Referring Physician: Lajean Saver Time called: [id number] Time at bedside: [name] is an 81 y.o. female.  HPI: Sarah [name] was at home on her chair lift. She got off and somehow tripped on the stair and fell. She had immediate right hip pain and could not get up. She was brought to the ED where x-rays showed a right hip fx and orthopedic surgery was consulted. She lives at home with her blind husband and does not use any assistive devices to ambulate.  Past Medical History:  Diagnosis Date   Anemia    Cancer ([place])    Degenerative arthritis    Headache(784.0)    sinus   Hypertension    Hypothyroidism    Obesity    Syncope, near 1 year ago    Past Surgical History:  Procedure Laterality Date   ABDOMINAL HYSTERECTOMY     partial   arthroscopic knee surgery  July 03, 2013   CARDIAC CATHETERIZATION N/A 09/26/2016   Procedure: Left Heart Cath and Coronary Angiography;  Surgeon: Jettie Booze, MD;  Location: [facility];  Service: Cardiovascular;  Laterality: N/A;   CARPAL TUNNEL RELEASE Bilateral    CATARACT EXTRACTION, BILATERAL     CERVICAL SPINE SURGERY     COLONOSCOPY WITH PROPOFOL N/A 12/20/2019   Procedure: COLONOSCOPY WITH PROPOFOL;  Surgeon: Carol Ada, MD;  Location: [facility];  Service: Endoscopy;  Laterality: N/A;   cyst removed from eyebrow  4-5 yrs ago   CYSTOSCOPY W/ RETROGRADES Bilateral 07/26/2022   Procedure: CYSTOSCOPY WITH RETROGRADE PYELOGRAM;  Surgeon: Remi Haggard, MD;  Location: [facility];  Service: Urology;  Laterality: Bilateral;   HEMOSTASIS CLIP PLACEMENT  12/20/2019   Procedure: HEMOSTASIS CLIP PLACEMENT;  Surgeon: Carol Ada, MD;  Location: [facility];  Service: Endoscopy;;   LUMBAR SPINE SURGERY     POLYPECTOMY  12/20/2019   Procedure: POLYPECTOMY;  Surgeon: Carol Ada, MD;  Location: [facility];  Service: Endoscopy;;   SUBMUCOSAL LIFTING INJECTION  12/20/2019   Procedure: SUBMUCOSAL  LIFTING INJECTION;  Surgeon: Carol Ada, MD;  Location: [facility];  Service: Endoscopy;;   TONSILLECTOMY  age 16 or 73   TOTAL KNEE ARTHROPLASTY Right 12/18/2013   Procedure: RIGHT TOTAL KNEE ARTHROPLASTY;  Surgeon: Tobi Bastos, MD;  Location: [facility];  Service: Orthopedics;  Laterality: Right;   TRANSURETHRAL RESECTION OF BLADDER TUMOR N/A 07/26/2022   Procedure: TRANSURETHRAL RESECTION OF BLADDER TUMOR (TURBT);  Surgeon: Remi Haggard, MD;  Location: [facility];  Service: Urology;  Laterality: N/A;    Family History  Problem Relation Age of Onset   Alzheimer's disease Sister    Cancer - Lung Sister    Heart disease Brother    Heart disease Brother    Heart disease Brother    Cancer Brother        Bladder cancer    Social History:  reports that she has never smoked. She has never used smokeless tobacco. She reports that she does not drink alcohol and does not use drugs.  Allergies:  Allergies  Allergen Reactions   Atorvastatin Other (See Comments)    Fatigue, leg cramps   Keppra [Levetiracetam]     jitteriness   Other     Band-Aid cause patient to swell   Oxycodone-Acetaminophen Nausea And Vomiting   Tape Swelling and Other (See Comments)    redness   Penicillins Hives    Did it involve swelling of the face/tongue/throat, SOB,  or low BP? No Did it involve sudden or severe rash/hives, skin peeling, or any reaction on the inside of your mouth or nose? Yes Did you need to seek medical attention at a hospital or doctor's office? Yes When did it last happen?      10+ years If all above answers are "NO", may proceed with cephalosporin use. Tolerated Ancef without difficulty on 26 Jul 2022   Voltaren [Diclofenac Sodium] Hives and Rash    Medications: I have reviewed the patient's current medications.  No results found for this or any previous visit (from the past 48 hour(s)).  DG Chest 2 View  Result Date: 12/02/2022 CLINICAL DATA:  Chest pain.  Fall from chair  lift. EXAM: CHEST - 2 VIEW COMPARISON:  Two-view chest x-ray 09/23/2016 FINDINGS: Heart size is normal. 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 No superimposed airspace disease is present. No edema or effusion is present. Scarring at the lung apices is similar the prior study. IMPRESSION: 1. No acute cardiopulmonary disease. 2. 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 Electronically Signed   By: San Morelle M.D.   On: 12/02/2022 12:34   CT Cervical Spine Wo Contrast  Result Date: 12/02/2022 CLINICAL DATA:  Neck trauma (Age >= 65y); Head trauma, moderate-severe EXAM: CT HEAD WITHOUT CONTRAST CT CERVICAL SPINE WITHOUT CONTRAST TECHNIQUE: Multidetector CT imaging of the head and cervical spine was performed following the standard protocol without intravenous contrast. Multiplanar CT image reconstructions of the cervical spine were also generated. RADIATION DOSE REDUCTION: This exam was performed according to the departmental dose-optimization program which includes automated exposure control, adjustment of the mA and/or kV according to patient size and/or use of iterative reconstruction technique. COMPARISON:  04/17/2020 FINDINGS: CT HEAD FINDINGS Brain: No evidence of acute infarction, hemorrhage, hydrocephalus, extra-axial collection or mass lesion/mass effect. Scattered low-density changes within the periventricular and subcortical white matter compatible with chronic microvascular ischemic change. Mild diffuse cerebral volume loss. Vascular: Atherosclerotic calcifications involving the large vessels of the skull base. No unexpected hyperdense vessel. Skull: Normal. Negative for fracture or focal lesion. Sinuses/Orbits: No acute finding. Other: None. CT CERVICAL SPINE FINDINGS Alignment: Facet joints are aligned without dislocation or traumatic listhesis. Dens and lateral masses are aligned. Mild grade 1 anterolisthesis with focal kyphosis at C7-T1 is unchanged. Skull  base and vertebrae: Extensive interbody fusion throughout the cervical spine with hardware present at the C3-C5 levels and evidence of prior hardware removal at C6 and C7. Solid interbody fusion from C2 to C7. No evidence of fracture. No suspicious lytic or sclerotic bone lesion. Soft tissues and spinal canal: No prevertebral fluid or swelling. No visible canal hematoma. Disc levels: Interbody fusion of the cervical spine with posterior element hypertrophy. Upper chest: Patchy opacities at the bilateral lung apices with interlobular septal thickening. Other: None. IMPRESSION: 1. No acute intracranial abnormality. 2. No evidence of acute fracture or subluxation of the cervical spine. 3. Patchy opacities at the bilateral lung apices which with interlobular septal thickening, which may represent pulmonary edema or multifocal infection. Follow-up chest radiographs recommended to further assess. Electronically Signed   By: Davina Poke D.O.   On: 12/02/2022 11:47   CT Head Wo Contrast  Result Date: 12/02/2022 CLINICAL DATA:  Neck trauma (Age >= 65y); Head trauma, moderate-severe EXAM: CT HEAD WITHOUT CONTRAST CT CERVICAL SPINE WITHOUT CONTRAST TECHNIQUE: Multidetector CT imaging of the head and cervical spine was performed following the standard protocol without intravenous contrast. Multiplanar CT image reconstructions of the  cervical spine were also generated. RADIATION DOSE REDUCTION: This exam was performed according to the departmental dose-optimization program which includes automated exposure control, adjustment of the mA and/or kV according to patient size and/or use of iterative reconstruction technique. COMPARISON:  04/17/2020 FINDINGS: CT HEAD FINDINGS Brain: No evidence of acute infarction, hemorrhage, hydrocephalus, extra-axial collection or mass lesion/mass effect. Scattered low-density changes within the periventricular and subcortical white matter compatible with chronic microvascular ischemic  change. Mild diffuse cerebral volume loss. Vascular: Atherosclerotic calcifications involving the large vessels of the skull base. No unexpected hyperdense vessel. Skull: Normal. Negative for fracture or focal lesion. Sinuses/Orbits: No acute finding. Other: None. CT CERVICAL SPINE FINDINGS Alignment: Facet joints are aligned without dislocation or traumatic listhesis. Dens and lateral masses are aligned. Mild grade 1 anterolisthesis with focal kyphosis at C7-T1 is unchanged. Skull base and vertebrae: Extensive interbody fusion throughout the cervical spine with hardware present at the C3-C5 levels and evidence of prior hardware removal at C6 and C7. Solid interbody fusion from C2 to C7. No evidence of fracture. No suspicious lytic or sclerotic bone lesion. Soft tissues and spinal canal: No prevertebral fluid or swelling. No visible canal hematoma. Disc levels: Interbody fusion of the cervical spine with posterior element hypertrophy. Upper chest: Patchy opacities at the bilateral lung apices with interlobular septal thickening. Other: None. IMPRESSION: 1. No acute intracranial abnormality. 2. No evidence of acute fracture or subluxation of the cervical spine. 3. Patchy opacities at the bilateral lung apices which with interlobular septal thickening, which may represent pulmonary edema or multifocal infection. Follow-up chest radiographs recommended to further assess. Electronically Signed   By: Davina Poke D.O.   On: 12/02/2022 11:47   DG Hip Unilat W or Wo Pelvis 2-3 Views Right  Result Date: 12/02/2022 CLINICAL DATA:  Fall.  Right hip pain. EXAM: DG HIP (WITH OR WITHOUT PELVIS) 2-3V RIGHT COMPARISON:  CT abdomen and pelvis 06/29/2022 FINDINGS: There is shortening of the right femoral neck consistent with an impacted subcapital fracture without significant displacement. There is no dislocation. Mild hip joint space narrowing and marginal spurring are noted bilaterally. Sequelae of previous lumbar fusion  are partially visualized. IMPRESSION: Impacted right femoral neck fracture. Electronically Signed   By: Logan Bores M.D.   On: 12/02/2022 11:33   DG Ankle Complete Left  Result Date: 12/02/2022 CLINICAL DATA:  Status post fall EXAM: LEFT ANKLE COMPLETE - 3+ VIEW COMPARISON:  None Available. FINDINGS: There is ankle soft tissue swelling. No evidence of acute fracture. There is a well corticated bony fragment adjacent to the tip of the medial malleolus, consistent with chronic/prior injury. Plantar calcaneal spurring. Mild midfoot degenerative changes. IMPRESSION: Ankle soft tissue swelling without evidence of acute fracture. Evidence of old medial ankle injury. Electronically Signed   By: Maurine Simmering M.D.   On: 12/02/2022 11:33    Review of Systems  HENT:  Negative for ear discharge, ear pain, hearing loss and tinnitus.   Eyes:  Negative for photophobia and pain.  Respiratory:  Negative for cough and shortness of breath.   Cardiovascular:  Negative for chest pain.  Gastrointestinal:  Negative for abdominal pain, nausea and vomiting.  Genitourinary:  Negative for dysuria, flank pain, frequency and urgency.  Musculoskeletal:  Positive for arthralgias (Right hip, left ankle). Negative for back pain, myalgias and neck pain.  Neurological:  Negative for dizziness and headaches.  Hematological:  Does not bruise/bleed easily.  Psychiatric/Behavioral:  The patient is not nervous/anxious.    Blood pressure Marland Kitchen)  124/42, pulse 61, temperature 97.6 F (36.4 C), temperature source Oral, resp. rate 16, SpO2 98 %. Physical Exam Constitutional:      General: She is not in acute distress.    Appearance: She is well-developed. She is not diaphoretic.  HENT:     Head: Normocephalic and atraumatic.  Eyes:     General: No scleral icterus.       Right eye: No discharge.        Left eye: No discharge.     Conjunctiva/sclera: Conjunctivae normal.  Cardiovascular:     Rate and Rhythm: Normal rate and regular  rhythm.  Pulmonary:     Effort: Pulmonary effort is normal. No respiratory distress.  Musculoskeletal:     Cervical back: Normal range of motion.     Comments: RLE No traumatic wounds, ecchymosis, or rash  Nontender  No knee or ankle effusion  Knee stable to varus/ valgus and anterior/posterior stress  Sens DPN, SPN, TN intact  Motor EHL, ext, flex, evers 5/5  DP 2+, PT 1+, No significant edema  LLE No traumatic wounds, ecchymosis, or rash  Diffuse minimal ankle TTP  No knee or ankle effusion  Knee stable to varus/ valgus and anterior/posterior stress  Sens DPN, SPN, TN intact  Motor EHL, ext, flex, evers 5/5  DP 2+, PT 1+, No significant edema  Skin:    General: Skin is warm and dry.  Neurological:     Mental Status: She is alert.  Psychiatric:        Mood and Affect: Mood normal.        Behavior: Behavior normal.     Assessment/Plan: Left hip fx -- Will get CT for operative planning, fx may be amenable to cannulated hip pinning. Otherwise will need THA. Please keep NPO for now.    Lisette Abu, PA-C Orthopedic Surgery [phone] 12/02/2022, 1:06 PM

## 2022-12-02 NOTE — Assessment & Plan Note (Signed)
Bmp normal in past No evidence of volume overload Gentle, time limited IVF If not better, would check urine studies Recent TSH wnl  Trend

## 2022-12-02 NOTE — Assessment & Plan Note (Signed)
Mild leukocytosis with no s/s of infection Likely reactive Gentle, time limited IVF Trend and follow fever curve

## 2022-12-02 NOTE — H&P (Signed)
History and Physical    Patient: Sarah [name] QGB:[id number] DOB: 1941-10-10 DOA: 12/02/2022 DOS: the patient was seen and examined on 12/02/2022 PCP: Christain Sacramento, MD  Patient coming from: Home - lives with her husband. Ambulates independently    Chief Complaint: fall  HPI: Sarah [name] is a 81 y.o. female with medical history significant of HTN, hypothyroidism, CAD, hx of TIA, recurrent syncope who presented after a fall today. She uses a chair lift to go to her basement and was taking boxes down the basement. The chair lift stops and there are 2 stairs to step down. When she got of the lift she fell down these stairs and onto her right hip.  She had immediate pain and could not get up.  Her husband eventually heard her and called EMS.    She has been feeling good. Denies any fever/chills, vision changes/headaches, chest pain or palpitations, shortness of breath or cough, abdominal pain, N/V/D, dysuria or leg swelling.   No smoking/drinking   ER Course:  vitals: afebrile, bp: 124/42, HR; 61, RR: 16, oxygen: 98%RA Pertinent labs: wbc: 12.9, sodium: 131 Hip xray: impacted right femoral neck fracture Left ankle xray: soft tissue swelling no fracture Ct head: negative CT cervical spine: no fracture CXR: no acute findings  In ED: ortho consulted, pain meds, TRH asked to admit.    Review of Systems: As mentioned in the history of present illness. All other systems reviewed and are negative. Past Medical History:  Diagnosis Date   Anemia    Cancer ([place])    Degenerative arthritis    Headache(784.0)    sinus   Hypertension    Hypothyroidism    Obesity    Syncope, near 1 year ago   Past Surgical History:  Procedure Laterality Date   ABDOMINAL HYSTERECTOMY     partial   arthroscopic knee surgery  July 03, 2013   CARDIAC CATHETERIZATION N/A 09/26/2016   Procedure: Left Heart Cath and Coronary Angiography;  Surgeon: Jettie Booze, MD;  Location: [facility];  Service: Cardiovascular;  Laterality: N/A;   CARPAL TUNNEL RELEASE Bilateral    CATARACT EXTRACTION, BILATERAL     CERVICAL SPINE SURGERY     COLONOSCOPY WITH PROPOFOL N/A 12/20/2019   Procedure: COLONOSCOPY WITH PROPOFOL;  Surgeon: Carol Ada, MD;  Location: [facility];  Service: Endoscopy;  Laterality: N/A;   cyst removed from eyebrow  4-5 yrs ago   CYSTOSCOPY W/ RETROGRADES Bilateral 07/26/2022   Procedure: CYSTOSCOPY WITH RETROGRADE PYELOGRAM;  Surgeon: Remi Haggard, MD;  Location: [facility];  Service: Urology;  Laterality: Bilateral;   HEMOSTASIS CLIP PLACEMENT  12/20/2019   Procedure: HEMOSTASIS CLIP PLACEMENT;  Surgeon: Carol Ada, MD;  Location: [facility];  Service: Endoscopy;;   LUMBAR SPINE SURGERY     POLYPECTOMY  12/20/2019   Procedure: POLYPECTOMY;  Surgeon: Carol Ada, MD;  Location: [facility];  Service: Endoscopy;;   SUBMUCOSAL LIFTING INJECTION  12/20/2019   Procedure: SUBMUCOSAL LIFTING INJECTION;  Surgeon: Carol Ada, MD;  Location: [facility];  Service: Endoscopy;;   TONSILLECTOMY  age 40 or 72   TOTAL KNEE ARTHROPLASTY Right 12/18/2013   Procedure: RIGHT TOTAL KNEE ARTHROPLASTY;  Surgeon: Tobi Bastos, MD;  Location: [facility];  Service: Orthopedics;  Laterality: Right;   TRANSURETHRAL RESECTION OF BLADDER TUMOR N/A 07/26/2022   Procedure: TRANSURETHRAL RESECTION OF BLADDER TUMOR (TURBT);  Surgeon: Remi Haggard, MD;  Location: [facility];  Service: Urology;  Laterality: N/A;  Social History:  reports that she has never smoked. She has never used smokeless tobacco. She reports that she does not drink alcohol and does not use drugs.  Allergies  Allergen Reactions   Atorvastatin Other (See Comments)    Fatigue, leg cramps   Keppra [Levetiracetam]     jitteriness   Other     Band-Aid cause patient to swell   Oxycodone-Acetaminophen Nausea And Vomiting   Tape Swelling and Other (See Comments)    redness   Penicillins Hives    Did it involve  swelling of the face/tongue/throat, SOB, or low BP? No Did it involve sudden or severe rash/hives, skin peeling, or any reaction on the inside of your mouth or nose? Yes Did you need to seek medical attention at a hospital or doctor's office? Yes When did it last happen?      10+ years If all above answers are "NO", may proceed with cephalosporin use. Tolerated Ancef without difficulty on 26 Jul 2022   Voltaren [Diclofenac Sodium] Hives and Rash    Family History  Problem Relation Age of Onset   Alzheimer's disease Sister    Cancer - Lung Sister    Heart disease Brother    Heart disease Brother    Heart disease Brother    Cancer Brother        Bladder cancer    Prior to Admission medications   Medication Sig Start Date End Date Taking? Authorizing Provider  Artificial Tear Solution (SOOTHE XP OP) Place 1 drop into both eyes daily as needed (dry eyes).   Yes [provider]  b complex vitamins tablet Take 1 tablet by mouth daily.   Yes [provider]  Calcium Citrate-Vitamin D (CALCIUM + D PO) Take 1 tablet by mouth daily.   Yes [provider]  cyanocobalamin (VITAMIN B12) 500 MCG tablet Take 500 mcg by mouth daily.   Yes [provider]  estradiol (ESTRACE) 1 MG tablet Take 0.5 mg by mouth daily with breakfast.  05/07/13  Yes [provider]  Ferrous Gluconate (IRON) 240 (27 Fe) MG TABS Take 240 mg by mouth daily.   Yes [provider]  fluticasone (FLONASE) 50 MCG/ACT nasal spray Place 1 spray into both nostrils as needed for allergies.    Yes [provider]  levothyroxine (SYNTHROID, LEVOTHROID) 75 MCG tablet Take 75 mcg by mouth every morning.  04/08/13  Yes [provider]  OXcarbazepine (TRILEPTAL) 150 MG tablet Take 1 tablet (150 mg total) by mouth 2 (two) times daily. 08/10/22  Yes Suzzanne Cloud, NP  traMADol (ULTRAM) 50 MG tablet Take 1 tablet (50 mg total) by mouth every 6 (six) hours as needed. Patient not  taking: Reported on 12/02/2022 07/26/22 07/26/23  Remi Haggard, MD    Physical Exam: Vitals:   12/02/22 1024 12/02/22 1037  BP:  (!) 124/42  Pulse:  61  Resp:  16  Temp:  97.6 F (36.4 C)  TempSrc:  Oral  SpO2: 98% 98%   General:  Appears calm and comfortable and is in NAD Eyes:  PERRL, EOMI, normal lids, iris ENT:  grossly normal hearing, lips & tongue, mmm; appropriate dentition Neck:  no LAD, masses or thyromegaly; no carotid bruits Cardiovascular:  RRR, no m/r/g. No LE edema.  Respiratory:   CTA bilaterally with no wheezes/rales/rhonchi.  Normal respiratory effort. Abdomen:  soft, NT, ND, NABS Back:   normal alignment, no CVAT Skin:  no rash or induration seen on limited  exam Musculoskeletal:  RLE: slightly shortened and abducted. TTP over superior/lateral aspect of femur. No bruising or edema. Pedal pulses 2+, sensation intact.  LLE: wnl, no edema, pedal pulse 2+, sensation intact.  Lower extremity:  No LE edema.  Limited foot exam with no ulcerations.  2+ distal pulses. Psychiatric:  grossly normal mood and affect, speech fluent and appropriate, AOx3 Neurologic:  CN 2-12 grossly intact, moves all extremities in coordinated fashion, sensation intact   Radiological Exams on Admission: Independently reviewed - see discussion in A/P where applicable  CT HIP RIGHT WO CONTRAST  Result Date: 12/02/2022 CLINICAL DATA:  Hip trauma. Fracture suspected. Fell onto right hip. EXAM: CT OF THE RIGHT HIP WITHOUT CONTRAST TECHNIQUE: Multidetector CT imaging of the right hip was performed according to the standard protocol. Multiplanar CT image reconstructions were also generated. RADIATION DOSE REDUCTION: This exam was performed according to the departmental dose-optimization program which includes automated exposure control, adjustment of the mA and/or kV according to patient size and/or use of iterative reconstruction technique. COMPARISON:  Pelvis and right hip radiographs 12/02/2022,  CT abdomen and pelvis 06/29/2022 FINDINGS: Bones/Joint/Cartilage There is an acute subcapital right femoral neck fracture with approximately 4 mm inferior medial and 6 mm anterior displacement of the distal fracture component with respect to the proximal fracture component. There is mild-to-moderate fracture impaction. The right femoral head remains appropriately located with respect to the right acetabulum. Moderate right femoroacetabular joint space narrowing. Mild-to-moderate superolateral right acetabular degenerative osteophytosis. Moderate pubic symphysis joint space narrowing, subchondral sclerosis, and peripheral osteophytosis. Partial visualization of lower lumbar spine fusion hardware. Mild right sacroiliac subchondral sclerosis, degenerative vacuum phenomenon, and anterior endplate osteophytosis. Ligaments Suboptimally assessed by CT. Muscles and Tendons Normal size and density of the regional musculature. No gross tendon tear is seen. Soft tissues Mild curvilinear density within the posterolateral right buttock/hip subcutaneous fat consistent with soft tissue contusion. No hematoma is seen. Mild atherosclerotic calcifications. IMPRESSION: 1. Acute subcapital right femoral neck fracture with mild displacement and mild-to-moderate fracture impaction. 2. Moderate right femoroacetabular osteoarthritis. Electronically Signed   By: Yvonne Kendall M.D.   On: 12/02/2022 13:36   DG Chest 2 View  Result Date: 12/02/2022 CLINICAL DATA:  Chest pain.  Fall from chair lift. EXAM: CHEST - 2 VIEW COMPARISON:  Two-view chest x-ray 09/23/2016 FINDINGS: Heart size is normal. 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 No superimposed airspace disease is present. No edema or effusion is present. Scarring at the lung apices is similar the prior study. IMPRESSION: 1. No acute cardiopulmonary disease. 2. 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 Electronically Signed   By: San Morelle  M.D.   On: 12/02/2022 12:34   CT Cervical Spine Wo Contrast  Result Date: 12/02/2022 CLINICAL DATA:  Neck trauma (Age >= 65y); Head trauma, moderate-severe EXAM: CT HEAD WITHOUT CONTRAST CT CERVICAL SPINE WITHOUT CONTRAST TECHNIQUE: Multidetector CT imaging of the head and cervical spine was performed following the standard protocol without intravenous contrast. Multiplanar CT image reconstructions of the cervical spine were also generated. RADIATION DOSE REDUCTION: This exam was performed according to the departmental dose-optimization program which includes automated exposure control, adjustment of the mA and/or kV according to patient size and/or use of iterative reconstruction technique. COMPARISON:  04/17/2020 FINDINGS: CT HEAD FINDINGS Brain: No evidence of acute infarction, hemorrhage, hydrocephalus, extra-axial collection or mass lesion/mass effect. Scattered low-density changes within the periventricular and subcortical white matter compatible with chronic microvascular ischemic change. Mild diffuse cerebral volume loss. Vascular:  Atherosclerotic calcifications involving the large vessels of the skull base. No unexpected hyperdense vessel. Skull: Normal. Negative for fracture or focal lesion. Sinuses/Orbits: No acute finding. Other: None. CT CERVICAL SPINE FINDINGS Alignment: Facet joints are aligned without dislocation or traumatic listhesis. Dens and lateral masses are aligned. Mild grade 1 anterolisthesis with focal kyphosis at C7-T1 is unchanged. Skull base and vertebrae: Extensive interbody fusion throughout the cervical spine with hardware present at the C3-C5 levels and evidence of prior hardware removal at C6 and C7. Solid interbody fusion from C2 to C7. No evidence of fracture. No suspicious lytic or sclerotic bone lesion. Soft tissues and spinal canal: No prevertebral fluid or swelling. No visible canal hematoma. Disc levels: Interbody fusion of the cervical spine with posterior element  hypertrophy. Upper chest: Patchy opacities at the bilateral lung apices with interlobular septal thickening. Other: None. IMPRESSION: 1. No acute intracranial abnormality. 2. No evidence of acute fracture or subluxation of the cervical spine. 3. Patchy opacities at the bilateral lung apices which with interlobular septal thickening, which may represent pulmonary edema or multifocal infection. Follow-up chest radiographs recommended to further assess. Electronically Signed   By: Davina Poke D.O.   On: 12/02/2022 11:47   CT Head Wo Contrast  Result Date: 12/02/2022 CLINICAL DATA:  Neck trauma (Age >= 65y); Head trauma, moderate-severe EXAM: CT HEAD WITHOUT CONTRAST CT CERVICAL SPINE WITHOUT CONTRAST TECHNIQUE: Multidetector CT imaging of the head and cervical spine was performed following the standard protocol without intravenous contrast. Multiplanar CT image reconstructions of the cervical spine were also generated. RADIATION DOSE REDUCTION: This exam was performed according to the departmental dose-optimization program which includes automated exposure control, adjustment of the mA and/or kV according to patient size and/or use of iterative reconstruction technique. COMPARISON:  04/17/2020 FINDINGS: CT HEAD FINDINGS Brain: No evidence of acute infarction, hemorrhage, hydrocephalus, extra-axial collection or mass lesion/mass effect. Scattered low-density changes within the periventricular and subcortical white matter compatible with chronic microvascular ischemic change. Mild diffuse cerebral volume loss. Vascular: Atherosclerotic calcifications involving the large vessels of the skull base. No unexpected hyperdense vessel. Skull: Normal. Negative for fracture or focal lesion. Sinuses/Orbits: No acute finding. Other: None. CT CERVICAL SPINE FINDINGS Alignment: Facet joints are aligned without dislocation or traumatic listhesis. Dens and lateral masses are aligned. Mild grade 1 anterolisthesis with focal  kyphosis at C7-T1 is unchanged. Skull base and vertebrae: Extensive interbody fusion throughout the cervical spine with hardware present at the C3-C5 levels and evidence of prior hardware removal at C6 and C7. Solid interbody fusion from C2 to C7. No evidence of fracture. No suspicious lytic or sclerotic bone lesion. Soft tissues and spinal canal: No prevertebral fluid or swelling. No visible canal hematoma. Disc levels: Interbody fusion of the cervical spine with posterior element hypertrophy. Upper chest: Patchy opacities at the bilateral lung apices with interlobular septal thickening. Other: None. IMPRESSION: 1. No acute intracranial abnormality. 2. No evidence of acute fracture or subluxation of the cervical spine. 3. Patchy opacities at the bilateral lung apices which with interlobular septal thickening, which may represent pulmonary edema or multifocal infection. Follow-up chest radiographs recommended to further assess. Electronically Signed   By: Davina Poke D.O.   On: 12/02/2022 11:47   DG Hip Unilat W or Wo Pelvis 2-3 Views Right  Result Date: 12/02/2022 CLINICAL DATA:  Fall.  Right hip pain. EXAM: DG HIP (WITH OR WITHOUT PELVIS) 2-3V RIGHT COMPARISON:  CT abdomen and pelvis 06/29/2022 FINDINGS: There is shortening of the right femoral  neck consistent with an impacted subcapital fracture without significant displacement. There is no dislocation. Mild hip joint space narrowing and marginal spurring are noted bilaterally. Sequelae of previous lumbar fusion are partially visualized. IMPRESSION: Impacted right femoral neck fracture. Electronically Signed   By: Logan Bores M.D.   On: 12/02/2022 11:33   DG Ankle Complete Left  Result Date: 12/02/2022 CLINICAL DATA:  Status post fall EXAM: LEFT ANKLE COMPLETE - 3+ VIEW COMPARISON:  None Available. FINDINGS: There is ankle soft tissue swelling. No evidence of acute fracture. There is a well corticated bony fragment adjacent to the tip of the  medial malleolus, consistent with chronic/prior injury. Plantar calcaneal spurring. Mild midfoot degenerative changes. IMPRESSION: Ankle soft tissue swelling without evidence of acute fracture. Evidence of old medial ankle injury. Electronically Signed   By: Maurine Simmering M.D.   On: 12/02/2022 11:33    EKG: pending   Labs on Admission: I have personally reviewed the available labs and imaging studies at the time of the admission.  Pertinent labs:   wbc: 12.9,  sodium: 131  Assessment and Plan: Principal Problem:   Closed displaced fracture of right femoral neck (HCC) Active Problems:   Leukocytosis   Hyponatremia   Essential hypertension   Hypothyroidism   Recurrent syncope    Assessment and Plan: * Closed displaced fracture of right femoral neck ([place]) 81 year old female presenting to ED s/p fall found to have impacted right femoral neck fracture -admit to med surg -ortho consulted with plans for surgery today or tomorrow (Dr. Lyla Glassing) -NPO -SCDs -NWB -hip fracture protocol utilized  -pain medication with morphine PRN -ice -vitamin D, routine labs    Leukocytosis Mild leukocytosis with no s/s of infection Likely reactive Gentle, time limited IVF Trend and follow fever curve  Hyponatremia Bmp normal in past No evidence of volume overload Gentle, time limited IVF If not better, would check urine studies Recent TSH wnl  Trend   Essential hypertension On no medication, continue to monitor Has been well controlled   Hypothyroidism Recent TSH wnl Continue home synthroid   Recurrent syncope Followed by neurology, cardiac w/u essentially negative On trileptal Bid for presumed seizures Continue trileptal, seizure precautions     Advance Care Planning:   Code Status: Full Code   Consults: ortho: Dr. Lyla Glassing   DVT Prophylaxis: SCD  Family Communication: son at bedside  Severity of Illness: The appropriate patient status for this patient is INPATIENT.  Inpatient status is judged to be reasonable and necessary in order to provide the required intensity of service to ensure the patient's safety. The patient's presenting symptoms, physical exam findings, and initial radiographic and laboratory data in the context of their chronic comorbidities is felt to place them at high risk for further clinical deterioration. Furthermore, it is not anticipated that the patient will be medically stable for discharge from the hospital within 2 midnights of admission.   * I certify that at the point of admission it is my clinical judgment that the patient will require inpatient hospital care spanning beyond 2 midnights from the point of admission due to high intensity of service, high risk for further deterioration and high frequency of surveillance required.*  Author: Orma Flaming, MD 12/02/2022 3:12 PM  For on call review [email or web address].

## 2022-12-02 NOTE — Assessment & Plan Note (Signed)
On no medication, continue to monitor Has been well controlled

## 2022-12-02 NOTE — Assessment & Plan Note (Signed)
81 year old female presenting to ED s/p fall found to have impacted right femoral neck fracture -admit to med surg -ortho consulted with plans for surgery today or tomorrow (Dr. Lyla Glassing) -NPO -SCDs -NWB -hip fracture protocol utilized  -pain medication with morphine PRN -ice -vitamin D, routine labs

## 2022-12-02 NOTE — Assessment & Plan Note (Addendum)
Recent TSH wnl Continue home synthroid

## 2022-12-02 NOTE — ED Triage Notes (Signed)
Pt arrives via EMS from home where she was stepping off her stair chair at bottom of stairs. Pt fell on right hip. EMS gave 100 mcg fentanyl and placed in ccollar.

## 2022-12-02 NOTE — ED Provider Notes (Signed)
[facility] EMERGENCY DEPARTMENT Provider Note   CSN: [id number] Arrival date & time: 12/02/22  1017     History  Chief Complaint  Patient presents with   Sarah [name] is a 81 y.o. female with medical history of anemia, degenerative arthritis, hypertension, hypothyroid.  Patient presents ED for evaluation of fall.  Patient reports that prior to arrival she was using her stair chair.  Patient reports that she got to the bottom of her stairs where she has 2 steps that she misstepped on her own.  Patient reports that she tripped and fell on these last 2 stairs and landed on concrete on her right hip.  Patient unsure of losing consciousness, denies hitting her head.  Patient arrives in c-collar, denies neck pain.  Patient complaining of right hip pain, left ankle pain.  Patient denies blood thinners.  Patient received 100 mcg of fentanyl via EMS.  On examination the patient is alert and oriented x 3.   Fall       Home Medications Prior to Admission medications   Medication Sig Start Date End Date Taking? Authorizing Provider  Artificial Tear Solution (SOOTHE XP OP) Place 1 drop into both eyes daily as needed (dry eyes).   Yes [provider]  b complex vitamins tablet Take 1 tablet by mouth daily.   Yes [provider]  Calcium Citrate-Vitamin D (CALCIUM + D PO) Take 1 tablet by mouth daily.   Yes [provider]  cyanocobalamin (VITAMIN B12) 500 MCG tablet Take 500 mcg by mouth daily.   Yes [provider]  estradiol (ESTRACE) 1 MG tablet Take 0.5 mg by mouth daily with breakfast.  05/07/13  Yes [provider]  Ferrous Gluconate (IRON) 240 (27 Fe) MG TABS Take 240 mg by mouth daily.   Yes [provider]  fluticasone (FLONASE) 50 MCG/ACT nasal spray Place 1 spray into both nostrils as needed for allergies.    Yes [provider]  levothyroxine (SYNTHROID, LEVOTHROID) 75 MCG tablet Take 75 mcg  by mouth every morning.  04/08/13  Yes [provider]  OXcarbazepine (TRILEPTAL) 150 MG tablet Take 1 tablet (150 mg total) by mouth 2 (two) times daily. 08/10/22  Yes Suzzanne Cloud, NP      Allergies    Atorvastatin, Keppra [levetiracetam], Other, Oxycodone-acetaminophen, Tape, Penicillins, and Voltaren [diclofenac sodium]    Review of Systems   Review of Systems  Musculoskeletal:  Positive for arthralgias and myalgias. Negative for back pain and neck pain.  Neurological:  Negative for syncope.  All other systems reviewed and are negative.   Physical Exam Updated Vital Signs BP (!) 124/42 (BP Location: Right Arm)   Pulse 61   Temp 97.6 F (36.4 C) (Oral)   Resp 16   LMP  (LMP Unknown)   SpO2 98%  Physical Exam Vitals and nursing note reviewed.  Constitutional:      General: She is not in acute distress.    Appearance: Normal appearance. She is not ill-appearing, toxic-appearing or diaphoretic.  HENT:     Head: Normocephalic and atraumatic.     Nose: Nose normal. No congestion.     Mouth/Throat:     Mouth: Mucous membranes are moist.     Pharynx: Oropharynx is clear.  Eyes:     Extraocular Movements: Extraocular movements intact.     Conjunctiva/sclera: Conjunctivae normal.     Pupils: Pupils are equal, round, and reactive to light.  Cardiovascular:  Rate and Rhythm: Normal rate and regular rhythm.  Pulmonary:     Effort: Pulmonary effort is normal.     Breath sounds: Normal breath sounds. No wheezing.  Abdominal:     General: Abdomen is flat. Bowel sounds are normal.     Palpations: Abdomen is soft.     Tenderness: There is no abdominal tenderness.  Musculoskeletal:     Cervical back: Normal range of motion and neck supple.     Right hip: Tenderness present. No deformity. Decreased range of motion.     Left ankle: Tenderness present. Decreased range of motion.     Comments: Patient right hip with tenderness to palpation however no obvious deformity.   Patient left ankle also shows slight soft tissue swelling however intact range of motion.  Skin:    Capillary Refill: Capillary refill takes less than 2 seconds.  Neurological:     Mental Status: She is alert and oriented to person, place, and time.     ED Results / Procedures / Treatments   Labs (all labs ordered are listed, but only abnormal results are displayed) Labs Reviewed  CBC - Abnormal; Notable for the following components:      Result Value   WBC 12.9 (*)    All other components within normal limits  BASIC METABOLIC PANEL - Abnormal; Notable for the following components:   Sodium 131 (*)    Glucose, Bld 115 (*)    Calcium 8.6 (*)    All other components within normal limits  HEPATIC FUNCTION PANEL  PROTIME-INR  URINALYSIS, ROUTINE W REFLEX MICROSCOPIC  TYPE AND SCREEN    EKG None  Radiology CT HIP RIGHT WO CONTRAST  Result Date: 12/02/2022 CLINICAL DATA:  Hip trauma. Fracture suspected. Fell onto right hip. EXAM: CT OF THE RIGHT HIP WITHOUT CONTRAST TECHNIQUE: Multidetector CT imaging of the right hip was performed according to the standard protocol. Multiplanar CT image reconstructions were also generated. RADIATION DOSE REDUCTION: This exam was performed according to the departmental dose-optimization program which includes automated exposure control, adjustment of the mA and/or kV according to patient size and/or use of iterative reconstruction technique. COMPARISON:  Pelvis and right hip radiographs 12/02/2022, CT abdomen and pelvis 06/29/2022 FINDINGS: Bones/Joint/Cartilage There is an acute subcapital right femoral neck fracture with approximately 4 mm inferior medial and 6 mm anterior displacement of the distal fracture component with respect to the proximal fracture component. There is mild-to-moderate fracture impaction. The right femoral head remains appropriately located with respect to the right acetabulum. Moderate right femoroacetabular joint space  narrowing. Mild-to-moderate superolateral right acetabular degenerative osteophytosis. Moderate pubic symphysis joint space narrowing, subchondral sclerosis, and peripheral osteophytosis. Partial visualization of lower lumbar spine fusion hardware. Mild right sacroiliac subchondral sclerosis, degenerative vacuum phenomenon, and anterior endplate osteophytosis. Ligaments Suboptimally assessed by CT. Muscles and Tendons Normal size and density of the regional musculature. No gross tendon tear is seen. Soft tissues Mild curvilinear density within the posterolateral right buttock/hip subcutaneous fat consistent with soft tissue contusion. No hematoma is seen. Mild atherosclerotic calcifications. IMPRESSION: 1. Acute subcapital right femoral neck fracture with mild displacement and mild-to-moderate fracture impaction. 2. Moderate right femoroacetabular osteoarthritis. Electronically Signed   By: Yvonne Kendall M.D.   On: 12/02/2022 13:36   DG Chest 2 View  Result Date: 12/02/2022 CLINICAL DATA:  Chest pain.  Fall from chair lift. EXAM: CHEST - 2 VIEW COMPARISON:  Two-view chest x-ray 09/23/2016 FINDINGS: Heart size is normal. Chronic interstitial coarsening and changes of COPD  present. No superimposed airspace disease is present. No edema or effusion is present. Scarring at the lung apices is similar the prior study. IMPRESSION: 1. No acute cardiopulmonary disease. 2. 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 Electronically Signed   By: San Morelle M.D.   On: 12/02/2022 12:34   CT Cervical Spine Wo Contrast  Result Date: 12/02/2022 CLINICAL DATA:  Neck trauma (Age >= 65y); Head trauma, moderate-severe EXAM: CT HEAD WITHOUT CONTRAST CT CERVICAL SPINE WITHOUT CONTRAST TECHNIQUE: Multidetector CT imaging of the head and cervical spine was performed following the standard protocol without intravenous contrast. Multiplanar CT image reconstructions of the cervical spine were also generated.  RADIATION DOSE REDUCTION: This exam was performed according to the departmental dose-optimization program which includes automated exposure control, adjustment of the mA and/or kV according to patient size and/or use of iterative reconstruction technique. COMPARISON:  04/17/2020 FINDINGS: CT HEAD FINDINGS Brain: No evidence of acute infarction, hemorrhage, hydrocephalus, extra-axial collection or mass lesion/mass effect. Scattered low-density changes within the periventricular and subcortical white matter compatible with chronic microvascular ischemic change. Mild diffuse cerebral volume loss. Vascular: Atherosclerotic calcifications involving the large vessels of the skull base. No unexpected hyperdense vessel. Skull: Normal. Negative for fracture or focal lesion. Sinuses/Orbits: No acute finding. Other: None. CT CERVICAL SPINE FINDINGS Alignment: Facet joints are aligned without dislocation or traumatic listhesis. Dens and lateral masses are aligned. Mild grade 1 anterolisthesis with focal kyphosis at C7-T1 is unchanged. Skull base and vertebrae: Extensive interbody fusion throughout the cervical spine with hardware present at the C3-C5 levels and evidence of prior hardware removal at C6 and C7. Solid interbody fusion from C2 to C7. No evidence of fracture. No suspicious lytic or sclerotic bone lesion. Soft tissues and spinal canal: No prevertebral fluid or swelling. No visible canal hematoma. Disc levels: Interbody fusion of the cervical spine with posterior element hypertrophy. Upper chest: Patchy opacities at the bilateral lung apices with interlobular septal thickening. Other: None. IMPRESSION: 1. No acute intracranial abnormality. 2. No evidence of acute fracture or subluxation of the cervical spine. 3. Patchy opacities at the bilateral lung apices which with interlobular septal thickening, which may represent pulmonary edema or multifocal infection. Follow-up chest radiographs recommended to further assess.  Electronically Signed   By: Davina Poke D.O.   On: 12/02/2022 11:47   CT Head Wo Contrast  Result Date: 12/02/2022 CLINICAL DATA:  Neck trauma (Age >= 65y); Head trauma, moderate-severe EXAM: CT HEAD WITHOUT CONTRAST CT CERVICAL SPINE WITHOUT CONTRAST TECHNIQUE: Multidetector CT imaging of the head and cervical spine was performed following the standard protocol without intravenous contrast. Multiplanar CT image reconstructions of the cervical spine were also generated. RADIATION DOSE REDUCTION: This exam was performed according to the departmental dose-optimization program which includes automated exposure control, adjustment of the mA and/or kV according to patient size and/or use of iterative reconstruction technique. COMPARISON:  04/17/2020 FINDINGS: CT HEAD FINDINGS Brain: No evidence of acute infarction, hemorrhage, hydrocephalus, extra-axial collection or mass lesion/mass effect. Scattered low-density changes within the periventricular and subcortical white matter compatible with chronic microvascular ischemic change. Mild diffuse cerebral volume loss. Vascular: Atherosclerotic calcifications involving the large vessels of the skull base. No unexpected hyperdense vessel. Skull: Normal. Negative for fracture or focal lesion. Sinuses/Orbits: No acute finding. Other: None. CT CERVICAL SPINE FINDINGS Alignment: Facet joints are aligned without dislocation or traumatic listhesis. Dens and lateral masses are aligned. Mild grade 1 anterolisthesis with focal kyphosis at C7-T1 is unchanged. Skull base and vertebrae: Extensive  interbody fusion throughout the cervical spine with hardware present at the C3-C5 levels and evidence of prior hardware removal at C6 and C7. Solid interbody fusion from C2 to C7. No evidence of fracture. No suspicious lytic or sclerotic bone lesion. Soft tissues and spinal canal: No prevertebral fluid or swelling. No visible canal hematoma. Disc levels: Interbody fusion of the cervical  spine with posterior element hypertrophy. Upper chest: Patchy opacities at the bilateral lung apices with interlobular septal thickening. Other: None. IMPRESSION: 1. No acute intracranial abnormality. 2. No evidence of acute fracture or subluxation of the cervical spine. 3. Patchy opacities at the bilateral lung apices which with interlobular septal thickening, which may represent pulmonary edema or multifocal infection. Follow-up chest radiographs recommended to further assess. Electronically Signed   By: Davina Poke D.O.   On: 12/02/2022 11:47   DG Hip Unilat W or Wo Pelvis 2-3 Views Right  Result Date: 12/02/2022 CLINICAL DATA:  Fall.  Right hip pain. EXAM: DG HIP (WITH OR WITHOUT PELVIS) 2-3V RIGHT COMPARISON:  CT abdomen and pelvis 06/29/2022 FINDINGS: There is shortening of the right femoral neck consistent with an impacted subcapital fracture without significant displacement. There is no dislocation. Mild hip joint space narrowing and marginal spurring are noted bilaterally. Sequelae of previous lumbar fusion are partially visualized. IMPRESSION: Impacted right femoral neck fracture. Electronically Signed   By: Logan Bores M.D.   On: 12/02/2022 11:33   DG Ankle Complete Left  Result Date: 12/02/2022 CLINICAL DATA:  Status post fall EXAM: LEFT ANKLE COMPLETE - 3+ VIEW COMPARISON:  None Available. FINDINGS: There is ankle soft tissue swelling. No evidence of acute fracture. There is a well corticated bony fragment adjacent to the tip of the medial malleolus, consistent with chronic/prior injury. Plantar calcaneal spurring. Mild midfoot degenerative changes. IMPRESSION: Ankle soft tissue swelling without evidence of acute fracture. Evidence of old medial ankle injury. Electronically Signed   By: Maurine Simmering M.D.   On: 12/02/2022 11:33    Procedures Procedures   Medications Ordered in ED Medications  fentaNYL (SUBLIMAZE) injection 50 mcg (has no administration in time range)  estradiol  (ESTRACE) tablet 0.5 mg (has no administration in time range)  levothyroxine (SYNTHROID) tablet 75 mcg (has no administration in time range)  ferrous sulfate tablet 325 mg (has no administration in time range)  OXcarbazepine (TRILEPTAL) tablet 150 mg (has no administration in time range)  fluticasone (FLONASE) 50 MCG/ACT nasal spray 1 spray (has no administration in time range)  Soothe XP SOLN (has no administration in time range)    ED Course/ Medical Decision Making/ A&P                           Medical Decision Making  81 year old female presents to ED for evaluation of right hip injury.  Please see HPI for further details.  On my examination the patient is afebrile and nontachycardic.  Patient lung sounds clear bilaterally, not hypoxic on room air.  Patient abdomen soft and compressible throughout.  Patient arrives in c-collar.  Patient also complaining of left ankle pain, does appear to have soft tissue swelling however intact range of motion.  Patient right hip tender to palpation.  Unable to further assess as the patient is in a hallway bed.  Patient workup will include plain film imaging of right hip, plain film imaging of left ankle, CT head, CT cervical spine.  CT head and CT cervical spine do not show any  intracranial or cervical spine abnormalities or pathology.  There is noted multifocal patchy opacities at bilateral lung bases concerning for infectious process.  Plain film imaging of chest was ordered at this time.  Plain film imaging of chest does not show any consolidations or effusions consistent with CAP.  Patient plain film imaging of left ankle shows soft tissue swelling however no definitive fracture.  Patient plain film imaging of right hip shows an impacted right femoral neck fracture.  At this time, orthopedics was consulted.  Hilbert Odor, PA-C has stated that the patient will need to be admitted for operative management.  Legrand Como has requested the patient be n.p.o.  after midnight.  Basic labs ordered at this time.  Patient with slight leukocytosis to 12.9.  Patient BMP shows slight hyponatremia at 131.  50 mcg of fentanyl provided for pain control this time.  Dr. Rogers Blocker, [facility] hospitalist team, has returned my call and agreed to admit the patient.  Patient stable at the time of admission.  Final Clinical Impression(s) / ED Diagnoses Final diagnoses:  Closed fracture of neck of right femur, initial encounter [facility])    Rx / [facility] Orders ED Discharge Orders     None         Azucena Cecil, PA-C 12/02/22 1440    Lajean Saver, MD 12/03/22 1455

## 2022-12-02 NOTE — Assessment & Plan Note (Signed)
Followed by neurology, cardiac w/u essentially negative On trileptal Bid for presumed seizures Continue trileptal, seizure precautions

## 2022-12-03 ENCOUNTER — Inpatient Hospital Stay (HOSPITAL_COMMUNITY): Payer: Medicare Other

## 2022-12-03 ENCOUNTER — Other Ambulatory Visit: Payer: Self-pay

## 2022-12-03 ENCOUNTER — Inpatient Hospital Stay (HOSPITAL_COMMUNITY): Payer: Medicare Other | Admitting: Certified Registered"

## 2022-12-03 ENCOUNTER — Encounter (HOSPITAL_COMMUNITY): Payer: Self-pay | Admitting: Family Medicine

## 2022-12-03 ENCOUNTER — Encounter (HOSPITAL_COMMUNITY)
Admission: EM | Disposition: A | Discharge status: Home Health | Payer: Self-pay | Source: Home / Self Care | Attending: Internal Medicine

## 2022-12-03 DIAGNOSIS — S72001A Fracture of unspecified part of neck of right femur, initial encounter for closed fracture: Secondary | ICD-10-CM

## 2022-12-03 DIAGNOSIS — I10 Essential (primary) hypertension: Secondary | ICD-10-CM | POA: Diagnosis not present

## 2022-12-03 DIAGNOSIS — M1990 Unspecified osteoarthritis, unspecified site: Secondary | ICD-10-CM | POA: Diagnosis not present

## 2022-12-03 DIAGNOSIS — E039 Hypothyroidism, unspecified: Secondary | ICD-10-CM | POA: Diagnosis not present

## 2022-12-03 HISTORY — PX: TOTAL HIP ARTHROPLASTY: SHX124

## 2022-12-03 LAB — CBC
HCT: 37.2 % (ref 36.0–46.0)
Hemoglobin: 12.6 g/dL (ref 12.0–15.0)
MCH: 30.1 pg (ref 26.0–34.0)
MCHC: 33.9 g/dL (ref 30.0–36.0)
MCV: 88.8 fL (ref 80.0–100.0)
Platelets: 168 10*3/uL (ref 150–400)
RBC: 4.19 MIL/uL (ref 3.87–5.11)
RDW: 11.9 % (ref 11.5–15.5)
WBC: 5.8 10*3/uL (ref 4.0–10.5)
nRBC: 0 % (ref 0.0–0.2)

## 2022-12-03 LAB — BASIC METABOLIC PANEL
Anion gap: 8 (ref 5–15)
BUN: 5 mg/dL — ABNORMAL LOW (ref 8–23)
CO2: 27 mmol/L (ref 22–32)
Calcium: 9.3 mg/dL (ref 8.9–10.3)
Chloride: 103 mmol/L (ref 98–111)
Creatinine, Ser: 0.7 mg/dL (ref 0.44–1.00)
GFR, Estimated: >60 mL/min (ref >60)
Glucose, Bld: 108 mg/dL — ABNORMAL HIGH (ref 70–99)
Potassium: 4.5 mmol/L (ref 3.5–5.1)
Sodium: 138 mmol/L (ref 135–145)

## 2022-12-03 LAB — SURGICAL PCR SCREEN
MRSA, PCR: NEGATIVE
Staphylococcus aureus: NEGATIVE

## 2022-12-03 SURGERY — ARTHROPLASTY, HIP, TOTAL, ANTERIOR APPROACH
Anesthesia: General | Site: Hip | Laterality: Right

## 2022-12-03 MED ORDER — FENTANYL CITRATE (PF) 100 MCG/2ML IJ SOLN
25.0000 ug | INTRAMUSCULAR | Status: DC | PRN
  Administered 2022-12-03 (×2): 50 ug via INTRAVENOUS

## 2022-12-03 MED ORDER — ALUM & MAG HYDROXIDE-SIMETH 200-200-20 MG/5ML PO SUSP: 30.0000 mL | ORAL | Status: DC | PRN

## 2022-12-03 MED ORDER — PHENYLEPHRINE 80 MCG/ML (10ML) SYRINGE FOR IV PUSH (FOR BLOOD PRESSURE SUPPORT)
PREFILLED_SYRINGE | INTRAVENOUS | Status: AC
  Filled 2022-12-03: qty 10

## 2022-12-03 MED ORDER — OXYCODONE HCL 5 MG PO TABS: 5.0000 mg | ORAL_TABLET | Freq: Four times a day (QID) | ORAL | Status: DC | PRN

## 2022-12-03 MED ORDER — POLYETHYLENE GLYCOL 3350 17 G PO PACK: 17.0000 g | PACK | Freq: Every day | ORAL | Status: DC | PRN

## 2022-12-03 MED ORDER — PHENOL 1.4 % MT LIQD: 1.0000 | OROMUCOSAL | Status: DC | PRN

## 2022-12-03 MED ORDER — ONDANSETRON HCL 4 MG PO TABS: 4.0000 mg | ORAL_TABLET | Freq: Four times a day (QID) | ORAL | Status: DC | PRN

## 2022-12-03 MED ORDER — METOCLOPRAMIDE HCL 5 MG/ML IJ SOLN: 5.0000 mg | Freq: Three times a day (TID) | INTRAMUSCULAR | Status: DC | PRN

## 2022-12-03 MED ORDER — FENTANYL CITRATE (PF) 250 MCG/5ML IJ SOLN
INTRAMUSCULAR | Status: DC | PRN
  Administered 2022-12-03: 50 ug via INTRAVENOUS
  Administered 2022-12-03: 100 ug via INTRAVENOUS
  Administered 2022-12-03: 50 ug via INTRAVENOUS

## 2022-12-03 MED ORDER — AMISULPRIDE (ANTIEMETIC) 5 MG/2ML IV SOLN: 10.0000 mg | Freq: Once | INTRAVENOUS | Status: DC | PRN

## 2022-12-03 MED ORDER — KETOROLAC TROMETHAMINE 30 MG/ML IJ SOLN
INTRAMUSCULAR | Status: AC
  Filled 2022-12-03: qty 1

## 2022-12-03 MED ORDER — MENTHOL 3 MG MT LOZG: 1.0000 | LOZENGE | OROMUCOSAL | Status: DC | PRN

## 2022-12-03 MED ORDER — HYDROMORPHONE HCL 1 MG/ML IJ SOLN
0.5000 mg | INTRAMUSCULAR | Status: DC | PRN
  Filled 2022-12-03: qty 0.5

## 2022-12-03 MED ORDER — SENNOSIDES-DOCUSATE SODIUM 8.6-50 MG PO TABS
1.0000 | ORAL_TABLET | Freq: Two times a day (BID) | ORAL | Status: DC
  Administered 2022-12-03 – 2022-12-05 (×3): 1 via ORAL
  Filled 2022-12-03 (×3): qty 1

## 2022-12-03 MED ORDER — BUPIVACAINE-EPINEPHRINE (PF) 0.5% -1:200000 IJ SOLN
INTRAMUSCULAR | Status: AC
  Filled 2022-12-03: qty 30

## 2022-12-03 MED ORDER — SENNA 8.6 MG PO TABS
1.0000 | ORAL_TABLET | Freq: Two times a day (BID) | ORAL | Status: DC
  Administered 2022-12-03 – 2022-12-04 (×3): 8.6 mg via ORAL
  Filled 2022-12-03 (×3): qty 1

## 2022-12-03 MED ORDER — BUPIVACAINE-EPINEPHRINE (PF) 0.5% -1:200000 IJ SOLN
INTRAMUSCULAR | Status: DC | PRN
  Administered 2022-12-03: 30 mL

## 2022-12-03 MED ORDER — ACETAMINOPHEN 500 MG PO TABS
1000.0000 mg | ORAL_TABLET | Freq: Once | ORAL | Status: AC
  Administered 2022-12-03: 1000 mg via ORAL

## 2022-12-03 MED ORDER — CHLORHEXIDINE GLUCONATE 0.12 % MT SOLN: 15.0000 mL | Freq: Once | OROMUCOSAL | Status: AC

## 2022-12-03 MED ORDER — SODIUM CHLORIDE (PF) 0.9 % IJ SOLN
INTRAMUSCULAR | Status: AC
  Filled 2022-12-03: qty 50

## 2022-12-03 MED ORDER — METHOCARBAMOL 1000 MG/10ML IJ SOLN: 500.0000 mg | Freq: Four times a day (QID) | INTRAVENOUS | Status: DC | PRN

## 2022-12-03 MED ORDER — MORPHINE SULFATE (PF) 2 MG/ML IV SOLN
0.5000 mg | INTRAVENOUS | Status: DC | PRN
  Administered 2022-12-03: 1 mg via INTRAVENOUS
  Filled 2022-12-03: qty 1

## 2022-12-03 MED ORDER — VANCOMYCIN HCL IN DEXTROSE 1-5 GM/200ML-% IV SOLN
INTRAVENOUS | Status: AC
  Filled 2022-12-03: qty 200

## 2022-12-03 MED ORDER — ORAL CARE MOUTH RINSE: 15.0000 mL | Freq: Once | OROMUCOSAL | Status: AC

## 2022-12-03 MED ORDER — SENNOSIDES-DOCUSATE SODIUM 8.6-50 MG PO TABS: 1.0000 | ORAL_TABLET | Freq: Every day | ORAL | Status: DC

## 2022-12-03 MED ORDER — DIPHENHYDRAMINE HCL 12.5 MG/5ML PO ELIX: 12.5000 mg | ORAL_SOLUTION | ORAL | Status: DC | PRN

## 2022-12-03 MED ORDER — CEFAZOLIN SODIUM-DEXTROSE 2-4 GM/100ML-% IV SOLN
2.0000 g | Freq: Four times a day (QID) | INTRAVENOUS | Status: AC
  Administered 2022-12-03 (×2): 2 g via INTRAVENOUS
  Filled 2022-12-03 (×2): qty 100

## 2022-12-03 MED ORDER — DEXAMETHASONE SODIUM PHOSPHATE 10 MG/ML IJ SOLN
INTRAMUSCULAR | Status: AC
  Filled 2022-12-03: qty 1

## 2022-12-03 MED ORDER — CHLORHEXIDINE GLUCONATE 0.12 % MT SOLN
OROMUCOSAL | Status: AC
  Administered 2022-12-03: 15 mL via OROMUCOSAL
  Filled 2022-12-03: qty 15

## 2022-12-03 MED ORDER — SODIUM CHLORIDE 0.9 % IV SOLN
INTRAVENOUS | Status: AC | PRN
  Administered 2022-12-03: 1000 mL via INTRAMUSCULAR

## 2022-12-03 MED ORDER — KETOROLAC TROMETHAMINE 30 MG/ML IJ SOLN
INTRAMUSCULAR | Status: DC | PRN
  Administered 2022-12-03: 30 mg

## 2022-12-03 MED ORDER — PHENYLEPHRINE 80 MCG/ML (10ML) SYRINGE FOR IV PUSH (FOR BLOOD PRESSURE SUPPORT)
PREFILLED_SYRINGE | INTRAVENOUS | Status: DC | PRN
  Administered 2022-12-03: 160 ug via INTRAVENOUS
  Administered 2022-12-03 (×4): 80 ug via INTRAVENOUS
  Administered 2022-12-03: 160 ug via INTRAVENOUS

## 2022-12-03 MED ORDER — HYDROCODONE-ACETAMINOPHEN 7.5-325 MG PO TABS: 1.0000 | ORAL_TABLET | ORAL | Status: DC | PRN

## 2022-12-03 MED ORDER — SUGAMMADEX SODIUM 200 MG/2ML IV SOLN
INTRAVENOUS | Status: DC | PRN
  Administered 2022-12-03: 200 mg via INTRAVENOUS

## 2022-12-03 MED ORDER — FENTANYL CITRATE (PF) 100 MCG/2ML IJ SOLN
INTRAMUSCULAR | Status: AC
  Filled 2022-12-03: qty 2

## 2022-12-03 MED ORDER — PROPOFOL 10 MG/ML IV BOLUS
INTRAVENOUS | Status: AC
  Filled 2022-12-03: qty 20

## 2022-12-03 MED ORDER — METHOCARBAMOL 500 MG PO TABS: 500.0000 mg | ORAL_TABLET | Freq: Four times a day (QID) | ORAL | Status: DC | PRN

## 2022-12-03 MED ORDER — SODIUM CHLORIDE (PF) 0.9 % IJ SOLN
INTRAMUSCULAR | Status: DC | PRN
  Administered 2022-12-03: 29 mL

## 2022-12-03 MED ORDER — SODIUM CHLORIDE 0.9 % IV SOLN: INTRAVENOUS | Status: DC

## 2022-12-03 MED ORDER — PHENYLEPHRINE HCL-NACL 20-0.9 MG/250ML-% IV SOLN
INTRAVENOUS | Status: DC | PRN
  Administered 2022-12-03: 20 ug/min via INTRAVENOUS

## 2022-12-03 MED ORDER — TRANEXAMIC ACID-NACL 1000-0.7 MG/100ML-% IV SOLN
INTRAVENOUS | Status: AC
  Filled 2022-12-03: qty 100

## 2022-12-03 MED ORDER — DOCUSATE SODIUM 100 MG PO CAPS
100.0000 mg | ORAL_CAPSULE | Freq: Two times a day (BID) | ORAL | Status: DC
  Administered 2022-12-03 – 2022-12-05 (×4): 100 mg via ORAL
  Filled 2022-12-03 (×4): qty 1

## 2022-12-03 MED ORDER — POLYETHYLENE GLYCOL 3350 17 G PO PACK
17.0000 g | PACK | Freq: Every day | ORAL | Status: DC
  Administered 2022-12-03 – 2022-12-05 (×3): 17 g via ORAL
  Filled 2022-12-03 (×3): qty 1

## 2022-12-03 MED ORDER — METOCLOPRAMIDE HCL 5 MG PO TABS: 5.0000 mg | ORAL_TABLET | Freq: Three times a day (TID) | ORAL | Status: DC | PRN

## 2022-12-03 MED ORDER — ONDANSETRON HCL 4 MG/2ML IJ SOLN
4.0000 mg | Freq: Four times a day (QID) | INTRAMUSCULAR | Status: DC | PRN
  Administered 2022-12-04: 4 mg via INTRAVENOUS
  Filled 2022-12-03: qty 2

## 2022-12-03 MED ORDER — LACTATED RINGERS IV SOLN: INTRAVENOUS | Status: DC

## 2022-12-03 MED ORDER — ONDANSETRON HCL 4 MG/2ML IJ SOLN
INTRAMUSCULAR | Status: AC
  Filled 2022-12-03: qty 2

## 2022-12-03 MED ORDER — LIDOCAINE 2% (20 MG/ML) 5 ML SYRINGE
INTRAMUSCULAR | Status: DC | PRN
  Administered 2022-12-03: 60 mg via INTRAVENOUS

## 2022-12-03 MED ORDER — IRRISEPT - 450ML BOTTLE WITH 0.05% CHG IN STERILE WATER, USP 99.95% OPTIME
TOPICAL | Status: DC | PRN
  Administered 2022-12-03: 450 mL

## 2022-12-03 MED ORDER — LIDOCAINE 2% (20 MG/ML) 5 ML SYRINGE
INTRAMUSCULAR | Status: AC
  Filled 2022-12-03: qty 5

## 2022-12-03 MED ORDER — DEXAMETHASONE SODIUM PHOSPHATE 10 MG/ML IJ SOLN
INTRAMUSCULAR | Status: DC | PRN
  Administered 2022-12-03: 10 mg via INTRAVENOUS

## 2022-12-03 MED ORDER — HYDROCODONE-ACETAMINOPHEN 5-325 MG PO TABS: 1.0000 | ORAL_TABLET | ORAL | Status: DC | PRN

## 2022-12-03 MED ORDER — PROPOFOL 10 MG/ML IV BOLUS
INTRAVENOUS | Status: DC | PRN
  Administered 2022-12-03: 30 mg via INTRAVENOUS
  Administered 2022-12-03: 50 mg via INTRAVENOUS
  Administered 2022-12-03: 100 mg via INTRAVENOUS

## 2022-12-03 MED ORDER — ACETAMINOPHEN 325 MG PO TABS: 325.0000 mg | ORAL_TABLET | Freq: Four times a day (QID) | ORAL | Status: DC | PRN

## 2022-12-03 MED ORDER — ROCURONIUM BROMIDE 10 MG/ML (PF) SYRINGE
PREFILLED_SYRINGE | INTRAVENOUS | Status: DC | PRN
  Administered 2022-12-03: 40 mg via INTRAVENOUS

## 2022-12-03 MED ORDER — ROCURONIUM BROMIDE 10 MG/ML (PF) SYRINGE
PREFILLED_SYRINGE | INTRAVENOUS | Status: AC
  Filled 2022-12-03: qty 10

## 2022-12-03 MED ORDER — FENTANYL CITRATE (PF) 250 MCG/5ML IJ SOLN
INTRAMUSCULAR | Status: AC
  Filled 2022-12-03: qty 5

## 2022-12-03 MED ORDER — ACETAMINOPHEN 500 MG PO TABS
ORAL_TABLET | ORAL | Status: AC
  Filled 2022-12-03: qty 2

## 2022-12-03 MED ORDER — ASPIRIN 81 MG PO CHEW
81.0000 mg | CHEWABLE_TABLET | Freq: Two times a day (BID) | ORAL | Status: DC
  Administered 2022-12-03 – 2022-12-05 (×4): 81 mg via ORAL
  Filled 2022-12-03 (×4): qty 1

## 2022-12-03 SURGICAL SUPPLY — 65 items
ACE SHELL 3H 52 E HIP (Shell) ×2 IMPLANT
ALCOHOL 70% 16 OZ (MISCELLANEOUS) ×2 IMPLANT
BAG COUNTER SPONGE SURGICOUNT (BAG) ×3 IMPLANT
CHLORAPREP W/TINT 26 (MISCELLANEOUS) ×2 IMPLANT
COVER SURGICAL LIGHT HANDLE (MISCELLANEOUS) ×2 IMPLANT
DERMABOND ADVANCED .7 DNX12 (GAUZE/BANDAGES/DRESSINGS) ×3 IMPLANT
DRAPE C-ARM 42X72 X-RAY (DRAPES) ×2 IMPLANT
DRAPE STERI IOBAN 125X83 (DRAPES) ×2 IMPLANT
DRAPE U-SHAPE 47X51 STRL (DRAPES) ×6 IMPLANT
DRESSING AQUACEL AG SP 3.5X10 (GAUZE/BANDAGES/DRESSINGS) ×1 IMPLANT
DRSG AQUACEL AG ADV 3.5X10 (GAUZE/BANDAGES/DRESSINGS) ×2 IMPLANT
DRSG AQUACEL AG SP 3.5X10 (GAUZE/BANDAGES/DRESSINGS) ×2
ELECT BLADE 4.0 EZ CLEAN MEGAD (MISCELLANEOUS) ×2
ELECT PENCIL ROCKER SW 15FT (MISCELLANEOUS) ×2 IMPLANT
ELECT REM PT RETURN 9FT ADLT (ELECTROSURGICAL) ×2
ELECTRODE BLDE 4.0 EZ CLN MEGD (MISCELLANEOUS) ×2 IMPLANT
ELECTRODE REM PT RTRN 9FT ADLT (ELECTROSURGICAL) ×2 IMPLANT
EVACUATOR 1/8 PVC DRAIN (DRAIN) IMPLANT
GLOVE BIO SURGEON STRL SZ8.5 (GLOVE) ×4 IMPLANT
GLOVE BIOGEL M 7.0 STRL (GLOVE) ×2 IMPLANT
GLOVE BIOGEL PI IND STRL 7.5 (GLOVE) ×2 IMPLANT
GLOVE BIOGEL PI IND STRL 8.5 (GLOVE) ×2 IMPLANT
GOWN STRL REUS W/ TWL LRG LVL3 (GOWN DISPOSABLE) ×4 IMPLANT
GOWN STRL REUS W/ TWL XL LVL3 (GOWN DISPOSABLE) ×2 IMPLANT
GOWN STRL REUS W/TWL 2XL LVL3 (GOWN DISPOSABLE) ×2 IMPLANT
GOWN STRL REUS W/TWL LRG LVL3 (GOWN DISPOSABLE) ×4
GOWN STRL REUS W/TWL XL LVL3 (GOWN DISPOSABLE) ×2
HANDPIECE INTERPULSE COAX TIP (DISPOSABLE) ×2
HEAD FEM -3XOFST 36XMDLR (Head) ×1 IMPLANT
HEAD MODULAR 36MM (Head) ×2 IMPLANT
HOOD PEEL AWAY FACE SHEILD DIS (HOOD) ×6 IMPLANT
JET LAVAGE IRRISEPT WOUND (IRRIGATION / IRRIGATOR) ×2
KIT BASIN OR (CUSTOM PROCEDURE TRAY) ×2 IMPLANT
KIT TURNOVER KIT B (KITS) ×2 IMPLANT
LAVAGE JET IRRISEPT WOUND (IRRIGATION / IRRIGATOR) ×2 IMPLANT
LINER ACE G7 HIGH 36 SZ E (Liner) ×1 IMPLANT
MANIFOLD NEPTUNE II (INSTRUMENTS) ×2 IMPLANT
MARKER SKIN DUAL TIP RULER LAB (MISCELLANEOUS) ×4 IMPLANT
NDL SPNL 18GX3.5 QUINCKE PK (NEEDLE) ×1 IMPLANT
NEEDLE SPNL 18GX3.5 QUINCKE PK (NEEDLE) ×2 IMPLANT
NS IRRIG 1000ML POUR BTL (IV SOLUTION) ×2 IMPLANT
PACK TOTAL JOINT (CUSTOM PROCEDURE TRAY) ×2 IMPLANT
PACK UNIVERSAL I (CUSTOM PROCEDURE TRAY) ×2 IMPLANT
PAD ARMBOARD 7.5X6 YLW CONV (MISCELLANEOUS) ×4 IMPLANT
SAW OSC TIP CART 19.5X105X1.3 (SAW) ×2 IMPLANT
SEALER BIPOLAR AQUA 6.0 (INSTRUMENTS) IMPLANT
SET HNDPC FAN SPRY TIP SCT (DISPOSABLE) ×2 IMPLANT
SHELL ACETAB 3H 52 E HIP (Shell) ×1 IMPLANT
SOL PREP POV-IOD 4OZ 10% (MISCELLANEOUS) ×2 IMPLANT
STAPLER VISISTAT 35W (STAPLE) ×1 IMPLANT
STEM FEM CMTLS STD 16X152 (Stem) ×1 IMPLANT
SUT ETHIBOND NAB CT1 #1 30IN (SUTURE) ×4 IMPLANT
SUT MNCRL AB 3-0 PS2 18 (SUTURE) ×2 IMPLANT
SUT MON AB 2-0 CT1 36 (SUTURE) ×2 IMPLANT
SUT VIC AB 2-0 CT1 27 (SUTURE) ×2
SUT VIC AB 2-0 CT1 TAPERPNT 27 (SUTURE) ×2 IMPLANT
SUT VLOC 180 0 24IN GS25 (SUTURE) ×2 IMPLANT
SYR 50ML LL SCALE MARK (SYRINGE) ×2 IMPLANT
TOWEL GREEN STERILE (TOWEL DISPOSABLE) ×2 IMPLANT
TOWEL GREEN STERILE FF (TOWEL DISPOSABLE) ×2 IMPLANT
TRAY CATH 16FR W/PLASTIC CATH (SET/KITS/TRAYS/PACK) IMPLANT
TRAY FOLEY W/BAG SLVR 16FR (SET/KITS/TRAYS/PACK)
TRAY FOLEY W/BAG SLVR 16FR ST (SET/KITS/TRAYS/PACK) IMPLANT
TUBE SUCT ARGYLE STRL (TUBING) ×2 IMPLANT
WATER STERILE IRR 1000ML POUR (IV SOLUTION) ×6 IMPLANT

## 2022-12-03 NOTE — Plan of Care (Signed)

## 2022-12-03 NOTE — Discharge Instructions (Signed)
? ?Dr. Jerimiah Wolman ?Joint Replacement Specialist ?[facility] ?[address] ?[place],  [address] ?[phone] ? ? ?TOTAL HIP REPLACEMENT POSTOPERATIVE DIRECTIONS ? ? ? ?Hip Rehabilitation, Guidelines Following Surgery  ? ?WEIGHT BEARING ?Weight bearing as tolerated with assist device (walker, cane, etc) as directed, use it as long as suggested by your surgeon or therapist, typically at least 4-6 weeks. ? ?The results of a hip operation are greatly improved after range of motion and muscle strengthening exercises. Follow all safety measures which are given to protect your hip. If any of these exercises cause increased pain or swelling in your joint, decrease the amount until you are comfortable again. Then slowly increase the exercises. Call your caregiver if you have problems or questions.  ? ?HOME CARE INSTRUCTIONS  ?Most of the following instructions are designed to prevent the dislocation of your new hip.  ?Remove items at home which could result in a fall. This includes throw rugs or furniture in walking pathways.  ?Continue medications as instructed at time of discharge. ?You may have some home medications which will be placed on hold until you complete the course of blood thinner medication. ?You may start showering once you are discharged home. Do not remove your dressing. ?Do not put on socks or shoes without following the instructions of your caregivers.   ?Sit on chairs with arms. Use the chair arms to help push yourself up when arising.  ?Arrange for the use of a toilet seat elevator so you are not sitting low.  ?Walk with walker as instructed.  ?You may resume a sexual relationship in one month or when given the OK by your caregiver.  ?Use walker as long as suggested by your caregivers.  ?You may put full weight on your legs and walk as much as is comfortable. ?Avoid periods of inactivity such as sitting longer than an hour when not asleep. This helps prevent blood  clots.  ?You may return to work once you are cleared by your surgeon.  ?Do not drive a car for 6 weeks or until released by your surgeon.  ?Do not drive while taking narcotics.  ?Wear elastic stockings for two weeks following surgery during the day but you may remove then at night.  ?Make sure you keep all of your appointments after your operation with all of your doctors and caregivers. You should call the office at the above phone number and make an appointment for approximately two weeks after the date of your surgery. ?Please pick up a stool softener and laxative for home use as long as you are requiring pain medications. ?ICE to the affected hip every three hours for 30 minutes at a time and then as needed for pain and swelling. Continue to use ice on the hip for pain and swelling from surgery. You may notice swelling that will progress down to the foot and ankle.  This is normal after surgery.  Elevate the leg when you are not up walking on it.   ?It is important for you to complete the blood thinner medication as prescribed by your doctor. ?Continue to use the breathing machine which will help keep your temperature down.  It is common for your temperature to cycle up and down following surgery, especially at night when you are not up moving around and exerting yourself.  The breathing machine keeps your lungs expanded and your temperature down. ? ?RANGE OF MOTION AND STRENGTHENING EXERCISES  ?These exercises are designed to help you   keep full movement of your hip joint. Follow your caregiver's or physical therapist's instructions. Perform all exercises about fifteen times, three times per day or as directed. Exercise both hips, even if you have had only one joint replacement. These exercises can be done on a training (exercise) mat, on the floor, on a table or on a bed. Use whatever works the best and is most comfortable for you. Use music or television while you are exercising so that the exercises are a  pleasant break in your day. This will make your life better with the exercises acting as a break in routine you can look forward to.  ?Lying on your back, slowly slide your foot toward your buttocks, raising your knee up off the floor. Then slowly slide your foot back down until your leg is straight again.  ?Lying on your back spread your legs as far apart as you can without causing discomfort.  ?Lying on your side, raise your upper leg and foot straight up from the floor as far as is comfortable. Slowly lower the leg and repeat.  ?Lying on your back, tighten up the muscle in the front of your thigh (quadriceps muscles). You can do this by keeping your leg straight and trying to raise your heel off the floor. This helps strengthen the largest muscle supporting your knee.  ?Lying on your back, tighten up the muscles of your buttocks both with the legs straight and with the knee bent at a comfortable angle while keeping your heel on the floor.  ? ?SKILLED REHAB INSTRUCTIONS: ?If the patient is transferred to a skilled rehab facility following release from the hospital, a list of the current medications will be sent to the facility for the patient to continue.  When discharged from the skilled rehab facility, please have the facility set up the patient's Home Health Physical Therapy prior to being released. Also, the skilled facility will be responsible for providing the patient with their medications at time of release from the facility to include their pain medication and their blood thinner medication. If the patient is still at the rehab facility at time of the two week follow up appointment, the skilled rehab facility will also need to assist the patient in arranging follow up appointment in our office and any transportation needs. ? ?POST-OPERATIVE OPIOID TAPER INSTRUCTIONS: ?It is important to wean off of your opioid medication as soon as possible. If you do not need pain medication after your surgery it is ok  to stop day one. ?Opioids include: ?Codeine, Hydrocodone(Norco, Vicodin), Oxycodone(Percocet, oxycontin) and hydromorphone amongst others.  ?Long term and even short term use of opiods can cause: ?Increased pain response ?Dependence ?Constipation ?Depression ?Respiratory depression ?And more.  ?Withdrawal symptoms can include ?Flu like symptoms ?Nausea, vomiting ?And more ?Techniques to manage these symptoms ?Hydrate well ?Eat regular healthy meals ?Stay active ?Use relaxation techniques(deep breathing, meditating, yoga) ?Do Not substitute Alcohol to help with tapering ?If you have been on opioids for less than two weeks and do not have pain than it is ok to stop all together.  ?Plan to wean off of opioids ?This plan should start within one week post op of your joint replacement. ?Maintain the same interval or time between taking each dose and first decrease the dose.  ?Cut the total daily intake of opioids by one tablet each day ?Next start to increase the time between doses. ?The last dose that should be eliminated is the evening dose.  ? ? ?MAKE   SURE YOU:  ?Understand these instructions.  ?Will watch your condition.  ?Will get help right away if you are not doing well or get worse. ? ?Pick up stool softner and laxative for home use following surgery while on pain medications. ?Do not remove your dressing. ?The dressing is waterproof--it is OK to take showers. ?Continue to use ice for pain and swelling after surgery. ?Do not use any lotions or creams on the incision until instructed by your surgeon. ?Total Hip Protocol. ? ?

## 2022-12-03 NOTE — Transfer of Care (Signed)
Immediate Anesthesia Transfer of Care Note  Patient: Sarah [name]  Procedure(s) Performed: TOTAL HIP ARTHROPLASTY ANTERIOR APPROACH (Right: Hip)  Patient Location: PACU  Anesthesia Type:General  Level of Consciousness: awake, alert , and oriented  Airway & Oxygen Therapy: Patient Spontanous Breathing and Patient connected to nasal cannula oxygen  Post-op Assessment: Report given to RN and Post -op Vital signs reviewed and stable  Post vital signs: Reviewed and stable  Last Vitals:  Vitals Value Taken Time  BP 91/45 12/03/22 0941  Temp 35.8 C 12/03/22 0933  Pulse 57 12/03/22 0942  Resp 13 12/03/22 0942  SpO2 100 % 12/03/22 0942  Vitals shown include unvalidated device data.  Last Pain:  Vitals:   12/03/22 0642  TempSrc: Oral  PainSc:          Complications: No notable events documented.

## 2022-12-03 NOTE — Op Note (Signed)
OPERATIVE REPORT  SURGEON: Rod Can, MD   ASSISTANT: Larene Pickett, PA-C  PREOPERATIVE DIAGNOSIS: Displaced Right femoral neck fracture.   POSTOPERATIVE DIAGNOSIS: Displaced Right femoral neck fracture.   PROCEDURE: Right total hip arthroplasty, anterior approach.   IMPLANTS: Biomet Taperloc Reduced Distal stem, size 16x139m, standard offset. Biomet G7 OsseoTi Cup, size 52 mm. Biomet Vivacit-E liner, size 36 mm, E, neutral. Biomet metal head ball, size 36 - 3 mm.  ANESTHESIA:  General  ANTIBIOTICS: 2g ancef.  ESTIMATED BLOOD LOSS: 350 mL    DRAINS: None.  COMPLICATIONS: None   CONDITION: PACU - hemodynamically stable.   BRIEF CLINICAL NOTE: Sarah DEFEOis a 81y.o. female with a displaced Right femoral neck fracture. The patient was admitted to the hospitalist service and underwent perioperative risk stratification and medical optimization. The risks, benefits, and alternatives to total hip arthroplasty were explained, and the patient elected to proceed.  PROCEDURE IN DETAIL: The patient was taken to the operating room and general anesthesia was induced on the hospital bed.  The patient was then positioned on the Hana table.  All bony prominences were well padded.  The hip was prepped and draped in the normal sterile surgical fashion.  A time-out was called verifying side and site of surgery. Antibiotics were given within 60 minutes of beginning the procedure.   Bikini incision was made, and the direct anterior approach to the hip was performed through the Hueter interval.  Lateral femoral circumflex vessels were treated with the Auqumantys. The anterior capsule was exposed and an inverted T capsulotomy was made.  Fracture hematoma was encountered and evacuated. The patient was found to have a comminuted Right subcapital femoral neck fracture.  I freshened the femoral neck cut with a saw.  I removed the femoral neck fragment.  A corkscrew was placed into the head and the  head was removed.  This was passed to the back table and was measured. The pubofemoral ligament was released subperiosteally to the lesser trochanter.  Acetabular exposure was achieved, and the pulvinar and labrum were excised. Sequential reaming of the acetabulum was then performed up to a size 51 mm reamer under direct visulization. A 52 mm cup was then opened and impacted into place at approximately 40 degrees of abduction and 20 degrees of anteversion. The final polyethylene liner was impacted into place and acetabular osteophytes were removed.    I then gained femoral exposure taking care to protect the abductors and greater trochanter.  This was performed using standard external rotation, extension, and adduction.  A cookie cutter was used to enter the femoral canal, and then the femoral canal finder was placed.  Sequential broaching was performed up to a size 16.  Calcar planer was used on the femoral neck remnant.  I placed a standard offset neck and a trial head ball.  The hip was reduced.  Leg lengths and offset were checked fluoroscopically.  The hip was dislocated and trial components were removed.  The final implants were placed, and the hip was reduced.  Fluoroscopy was used to confirm component position and leg lengths.  At 90 degrees of external rotation and full extension, the hip was stable to an anterior directed force.   The wound was copiously irrigated with Irrisept solution and normal saline using pule lavage.  Marcaine solution was injected into the periarticular soft tissue.  The wound was closed in layers using #1 Stratafix for the fascia, 2-0 Vicryl for the subcutaneous fat, 2-0 Monocryl for the  deep dermal layer, 3-0 running Monocryl subcuticular stitch, and Dermabond for the skin.  Once the glue was fully dried, an Aquacell Ag dressing was applied.  The patient was transported to the recovery room in stable condition.  Sponge, needle, and instrument counts were correct at the end  of the case x2.  The patient tolerated the procedure well and there were no known complications.  Please note that a surgical assistant was a medical necessity for this procedure to perform it in a safe and expeditious manner. Assistant was necessary to provide appropriate retraction of vital neurovascular structures, to prevent femoral fracture, and to allow for anatomic placement of the prosthesis.

## 2022-12-03 NOTE — Anesthesia Postprocedure Evaluation (Signed)
Anesthesia Post Note  Patient: Sarah [name]  Procedure(s) Performed: TOTAL HIP ARTHROPLASTY ANTERIOR APPROACH (Right: Hip)     Patient location during evaluation: PACU Anesthesia Type: General Level of consciousness: awake and alert Pain management: pain level controlled Vital Signs Assessment: post-procedure vital signs reviewed and stable Respiratory status: spontaneous breathing, nonlabored ventilation, respiratory function stable and patient connected to nasal cannula oxygen Cardiovascular status: blood pressure returned to baseline and stable Postop Assessment: no apparent nausea or vomiting Anesthetic complications: no  No notable events documented.  Last Vitals:  Vitals:   12/03/22 1039 12/03/22 1431  BP: (!) 111/51 115/75  Pulse: (!) 58 64  Resp:    Temp: (!) 36.4 C 36.7 C  SpO2: 100% 100%    Last Pain:  Vitals:   12/03/22 1431  TempSrc: Oral  PainSc:                  [name]

## 2022-12-03 NOTE — Anesthesia Procedure Notes (Signed)
Procedure Name: Intubation Date/Time: 12/03/2022 7:56 AM  Performed by: Anastasio Auerbach, CRNAPre-anesthesia Checklist: Patient identified, Emergency Drugs available, Suction available and Patient being monitored Patient Re-evaluated:Patient Re-evaluated prior to induction Oxygen Delivery Method: Circle system utilized Preoxygenation: Pre-oxygenation with 100% oxygen Induction Type: IV induction Ventilation: Mask ventilation without difficulty Laryngoscope Size: Glidescope and 3 Grade View: Grade I Tube type: Oral Tube size: 7.5 mm Number of attempts: 1 Airway Equipment and Method: Stylet and Oral airway Placement Confirmation: ETT inserted through vocal cords under direct vision, positive ETCO2 and breath sounds checked- equal and bilateral Secured at: 21 cm Tube secured with: Tape Dental Injury: Teeth and Oropharynx as per pre-operative assessment

## 2022-12-03 NOTE — Progress Notes (Signed)
PROGRESS NOTE  Sarah [name]  CVE:[id number] DOB: 16-Feb-1941 DOA: 12/02/2022 PCP: Christain Sacramento, MD   Brief Narrative: Patient is a 81 year old female with history of hypertension, hypothyroidism, coronary disease, TIA, recurrent syncope presented from home after she fell at home on the stairs on her right hip with immediate development of pain and could not get up.  On presentation she was hemodynamically stable.  Labs showed WBC of 12.9.  X-ray of the hip showed impacted right femoral neck fracture.  X-ray of left ankle showed soft tissue swelling but no fracture.  Head CT, cervical CT was negative for fracture or dislocation.  Ortho was consulted.  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  Orthopedics consulted.  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   chemical DVT prophylaxis after ORIF, PT/OT evaluation will follow.  Will wait for orthopedics recommendation for these.  Might need SNF on discharge.  Continue pain medications , bowel regimen  Leukocytosis: Mild most likely reactive. resolved  Hyponatremia: Resolved IV fluids  Hypertension: Not on any medications at home.  Currently well-controlled without any medications  Hypothyroidism: Continue Synthyroid  Recurrent syncope: Followed by neurology.  PT/OT evaluation pending  Seizure disorder: On Trileptal         DVT prophylaxis:SCDs Start: 12/02/22 1456     Code Status: Full Code  Family Communication: None at bedside  Patient status:Inpatient  Patient is from :Home  Anticipated discharge to:SNF  Estimated DC date:not sure   Consultants: Ortho  Procedures: ORIF  Antimicrobials:  Anti-infectives  (From admission, onward)    Start     Dose/Rate Route Frequency Ordered Stop   12/03/22 0638  vancomycin (VANCOCIN) 1-5 GM/200ML-% IVPB       Note to Pharmacy: Grace Blight M: cabinet override      12/03/22 0638 12/03/22 0805   12/03/22 0600  vancomycin (VANCOCIN) IVPB 1000 mg/200 mL premix        1,000 mg 200 mL/hr over 60 Minutes Intravenous On call to O.R. 12/02/22 1834 12/04/22 0559       Subjective: Patient seen and examined at bedside at PACU.  Hemodynamically stable.  She was still drowsy/sleepy from the analgesics.  Not in any acute distress.  Looks comfortable  Objective: Vitals:   12/02/22 2015 12/03/22 0412 12/03/22 0642 12/03/22 0645  BP: 133/78 (!) 143/62  (!) 144/80  Pulse: 60 64  74  Resp: 19 17    Temp: (!) 97.4 F (36.3 C) 98.3 F (36.8 C) 98.7 F (37.1 C)   TempSrc: Oral Oral Oral   SpO2: 97% 98%  96%  Weight:    68.3 kg  Height:    5' 7.01" (1.702 m)    Intake/Output Summary (Last 24 hours) at 12/03/2022 0841 Last data filed at 12/03/2022 0816 Gross per 24 hour  Intake 420 ml  Output 1000 ml  Net -580 ml   Filed Weights   12/02/22 1738 12/03/22 0645  Weight: 68.3 kg 68.3 kg    Examination:  General exam: Overall comfortable, not in distress,in PACU,sleepy ,drowsy Respiratory system:  no wheezes or crackles  Cardiovascular system: S1 & S2 heard, RRR.  Gastrointestinal system: Abdomen is nondistended, soft and nontender. Central  nervous system: sleepy,drowsy Extremities: No edema, no clubbing ,no cyanosis, surgical wound on the right hip Skin: No rashes, no ulcers,no icterus     Data Reviewed: I have personally reviewed following labs and imaging studies  CBC: Recent Labs  Lab 12/02/22 1230 12/03/22 0347  WBC 12.9* 5.8  HGB 12.5 12.6  HCT 37.3 37.2  MCV 91.0 88.8  PLT 160 951   Basic Metabolic Panel: Recent Labs  Lab 12/02/22 1230 12/03/22 0347  NA 131* 138  K 4.1 4.5  CL 102 103  CO2 22 27  GLUCOSE 115* 108*  BUN 10 5*   CREATININE 0.58 0.70  CALCIUM 8.6* 9.3     Recent Results (from the past 240 hour(s))  Surgical pcr screen     Status: None   Collection Time: 12/03/22  1:36 AM   Specimen: Nasal Mucosa; Nasal Swab  Result Value Ref Range Status   MRSA, PCR NEGATIVE NEGATIVE Final   Staphylococcus aureus NEGATIVE NEGATIVE Final    Comment: (NOTE) The Xpert SA Assay (FDA approved for NASAL specimens in patients 44 years of age and older), is one component of a comprehensive surveillance program. It is not intended to diagnose infection nor to guide or monitor treatment. Performed at [facility], [place] [address] [place], [place] [address]      Radiology Studies: DG Knee Right Port  Result Date: 12/02/2022 CLINICAL DATA:  Right femoral neck closed fracture following a fall. EXAM: PORTABLE RIGHT KNEE - 1-2 VIEW COMPARISON:  12/18/2013. FINDINGS: Again demonstrated is a right total knee prosthesis. There is a linear avulsion fracture fragment adjacent to the medial femoral condyle. This is corticated. No acute fracture, dislocation or effusion. IMPRESSION: 1. No acute fracture. 2. Chronic avulsion fracture of the medial femoral condyle. Electronically Signed   By: Claudie Revering M.D.   On: 12/02/2022 22:41   CT HIP RIGHT WO CONTRAST  Result Date: 12/02/2022 CLINICAL DATA:  Hip trauma. Fracture suspected. Fell onto right hip. EXAM: CT OF THE RIGHT HIP WITHOUT CONTRAST TECHNIQUE: Multidetector CT imaging of the right hip was performed according to the standard protocol. Multiplanar CT image reconstructions were also generated. RADIATION DOSE REDUCTION: This exam was performed according to the departmental dose-optimization program which includes automated exposure control, adjustment of the mA and/or kV according to patient size and/or use of iterative reconstruction technique. COMPARISON:  Pelvis and right hip radiographs 12/02/2022, CT abdomen and pelvis 06/29/2022 FINDINGS: Bones/Joint/Cartilage  There is an acute subcapital right femoral neck fracture with approximately 4 mm inferior medial and 6 mm anterior displacement of the distal fracture component with respect to the proximal fracture component. There is mild-to-moderate fracture impaction. The right femoral head remains appropriately located with respect to the right acetabulum. Moderate right femoroacetabular joint space narrowing. Mild-to-moderate superolateral right acetabular degenerative osteophytosis. Moderate pubic symphysis joint space narrowing, subchondral sclerosis, and peripheral osteophytosis. Partial visualization of lower lumbar spine fusion hardware. Mild right sacroiliac subchondral sclerosis, degenerative vacuum phenomenon, and anterior endplate osteophytosis. Ligaments Suboptimally assessed by CT. Muscles and Tendons Normal size and density of the regional musculature. No gross tendon tear is seen. Soft tissues Mild curvilinear density within the posterolateral right buttock/hip subcutaneous fat consistent with soft tissue contusion. No hematoma is seen. Mild atherosclerotic calcifications. IMPRESSION: 1. Acute subcapital right femoral neck fracture with mild displacement and mild-to-moderate fracture impaction. 2. Moderate right femoroacetabular osteoarthritis. Electronically Signed   By: Yvonne Kendall M.D.   On: 12/02/2022 13:36   DG Chest 2  View  Result Date: 12/02/2022 CLINICAL DATA:  Chest pain.  Fall from chair lift. EXAM: CHEST - 2 VIEW COMPARISON:  Two-view chest x-ray 09/23/2016 FINDINGS: Heart size is normal. 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 No superimposed airspace disease is present. No edema or effusion is present. Scarring at the lung apices is similar the prior study. IMPRESSION: 1. No acute cardiopulmonary disease. 2. 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 Electronically Signed   By: San Morelle M.D.   On: 12/02/2022 12:34   CT Cervical Spine Wo  Contrast  Result Date: 12/02/2022 CLINICAL DATA:  Neck trauma (Age >= 65y); Head trauma, moderate-severe EXAM: CT HEAD WITHOUT CONTRAST CT CERVICAL SPINE WITHOUT CONTRAST TECHNIQUE: Multidetector CT imaging of the head and cervical spine was performed following the standard protocol without intravenous contrast. Multiplanar CT image reconstructions of the cervical spine were also generated. RADIATION DOSE REDUCTION: This exam was performed according to the departmental dose-optimization program which includes automated exposure control, adjustment of the mA and/or kV according to patient size and/or use of iterative reconstruction technique. COMPARISON:  04/17/2020 FINDINGS: CT HEAD FINDINGS Brain: No evidence of acute infarction, hemorrhage, hydrocephalus, extra-axial collection or mass lesion/mass effect. Scattered low-density changes within the periventricular and subcortical white matter compatible with chronic microvascular ischemic change. Mild diffuse cerebral volume loss. Vascular: Atherosclerotic calcifications involving the large vessels of the skull base. No unexpected hyperdense vessel. Skull: Normal. Negative for fracture or focal lesion. Sinuses/Orbits: No acute finding. Other: None. CT CERVICAL SPINE FINDINGS Alignment: Facet joints are aligned without dislocation or traumatic listhesis. Dens and lateral masses are aligned. Mild grade 1 anterolisthesis with focal kyphosis at C7-T1 is unchanged. Skull base and vertebrae: Extensive interbody fusion throughout the cervical spine with hardware present at the C3-C5 levels and evidence of prior hardware removal at C6 and C7. Solid interbody fusion from C2 to C7. No evidence of fracture. No suspicious lytic or sclerotic bone lesion. Soft tissues and spinal canal: No prevertebral fluid or swelling. No visible canal hematoma. Disc levels: Interbody fusion of the cervical spine with posterior element hypertrophy. Upper chest: Patchy opacities at the  bilateral lung apices with interlobular septal thickening. Other: None. IMPRESSION: 1. No acute intracranial abnormality. 2. No evidence of acute fracture or subluxation of the cervical spine. 3. Patchy opacities at the bilateral lung apices which with interlobular septal thickening, which may represent pulmonary edema or multifocal infection. Follow-up chest radiographs recommended to further assess. Electronically Signed   By: Davina Poke D.O.   On: 12/02/2022 11:47   CT Head Wo Contrast  Result Date: 12/02/2022 CLINICAL DATA:  Neck trauma (Age >= 65y); Head trauma, moderate-severe EXAM: CT HEAD WITHOUT CONTRAST CT CERVICAL SPINE WITHOUT CONTRAST TECHNIQUE: Multidetector CT imaging of the head and cervical spine was performed following the standard protocol without intravenous contrast. Multiplanar CT image reconstructions of the cervical spine were also generated. RADIATION DOSE REDUCTION: This exam was performed according to the departmental dose-optimization program which includes automated exposure control, adjustment of the mA and/or kV according to patient size and/or use of iterative reconstruction technique. COMPARISON:  04/17/2020 FINDINGS: CT HEAD FINDINGS Brain: No evidence of acute infarction, hemorrhage, hydrocephalus, extra-axial collection or mass lesion/mass effect. Scattered low-density changes within the periventricular and subcortical white matter compatible with chronic microvascular ischemic change. Mild diffuse cerebral volume loss. Vascular: Atherosclerotic calcifications involving the large vessels of the skull base. No unexpected hyperdense vessel. Skull: Normal. Negative for fracture or focal lesion. Sinuses/Orbits: No acute finding.  Other: None. CT CERVICAL SPINE FINDINGS Alignment: Facet joints are aligned without dislocation or traumatic listhesis. Dens and lateral masses are aligned. Mild grade 1 anterolisthesis with focal kyphosis at C7-T1 is unchanged. Skull base and  vertebrae: Extensive interbody fusion throughout the cervical spine with hardware present at the C3-C5 levels and evidence of prior hardware removal at C6 and C7. Solid interbody fusion from C2 to C7. No evidence of fracture. No suspicious lytic or sclerotic bone lesion. Soft tissues and spinal canal: No prevertebral fluid or swelling. No visible canal hematoma. Disc levels: Interbody fusion of the cervical spine with posterior element hypertrophy. Upper chest: Patchy opacities at the bilateral lung apices with interlobular septal thickening. Other: None. IMPRESSION: 1. No acute intracranial abnormality. 2. No evidence of acute fracture or subluxation of the cervical spine. 3. Patchy opacities at the bilateral lung apices which with interlobular septal thickening, which may represent pulmonary edema or multifocal infection. Follow-up chest radiographs recommended to further assess. Electronically Signed   By: Davina Poke D.O.   On: 12/02/2022 11:47   DG Hip Unilat W or Wo Pelvis 2-3 Views Right  Result Date: 12/02/2022 CLINICAL DATA:  Fall.  Right hip pain. EXAM: DG HIP (WITH OR WITHOUT PELVIS) 2-3V RIGHT COMPARISON:  CT abdomen and pelvis 06/29/2022 FINDINGS: There is shortening of the right femoral neck consistent with an impacted subcapital fracture without significant displacement. There is no dislocation. Mild hip joint space narrowing and marginal spurring are noted bilaterally. Sequelae of previous lumbar fusion are partially visualized. IMPRESSION: Impacted right femoral neck fracture. Electronically Signed   By: Logan Bores M.D.   On: 12/02/2022 11:33   DG Ankle Complete Left  Result Date: 12/02/2022 CLINICAL DATA:  Status post fall EXAM: LEFT ANKLE COMPLETE - 3+ VIEW COMPARISON:  None Available. FINDINGS: There is ankle soft tissue swelling. No evidence of acute fracture. There is a well corticated bony fragment adjacent to the tip of the medial malleolus, consistent with chronic/prior  injury. Plantar calcaneal spurring. Mild midfoot degenerative changes. IMPRESSION: Ankle soft tissue swelling without evidence of acute fracture. Evidence of old medial ankle injury. Electronically Signed   By: Maurine Simmering M.D.   On: 12/02/2022 11:33    Scheduled Meds:  acetaminophen       [MAR Hold] estradiol  0.5 mg Oral Q breakfast   [MAR Hold] ferrous sulfate  325 mg Oral Daily   [MAR Hold] levothyroxine  75 mcg Oral BH-q7a   [MAR Hold] OXcarbazepine  150 mg Oral BID   [MAR Hold] senna-docusate  1 tablet Oral QHS   Continuous Infusions:  sodium chloride     lactated ringers 10 mL/hr at 12/03/22 0746   vancomycin       LOS: 1 day   Shelly Coss, MD [facility] P12/30/2023, 8:41 AM

## 2022-12-03 NOTE — Interval H&P Note (Signed)
History and Physical Interval Note:  12/03/2022 7:17 AM  Sarah [name]  has presented today for surgery, with the diagnosis of Right Hip Fx.  The various methods of treatment have been discussed with the patient and family. After consideration of risks, benefits and other options for treatment, the patient has consented to right total hip arthroplasty as a surgical intervention.  The patient's history has been reviewed, patient examined, no change in status, stable for surgery.  I have reviewed the patient's chart and labs.  Questions were answered to the patient's satisfaction.    The risks, benefits, and alternatives were discussed with the patient. There are risks associated with the surgery including, but not limited to, problems with anesthesia (death), infection, instability (giving out of the joint), dislocation, differences in leg length/angulation/rotation, fracture of bones, loosening or failure of implants, hematoma (blood accumulation) which may require surgical drainage, blood clots, pulmonary embolism, nerve injury (foot drop and lateral thigh numbness), and blood vessel injury. The patient understands these risks and elects to proceed.   Hilton Cork Gwin [place]

## 2022-12-03 NOTE — Anesthesia Preprocedure Evaluation (Addendum)
Anesthesia Evaluation  Patient identified by MRN, date of birth, ID band Patient awake    Reviewed: Allergy & Precautions, NPO status , Patient's Chart, lab work & pertinent test results  Airway Mallampati: II  TM Distance: >3 FB Neck ROM: Full    Dental  (+) Dental Advisory Given   Pulmonary neg pulmonary ROS   breath sounds clear to auscultation       Cardiovascular hypertension, Pt. on medications  Rhythm:Regular Rate:Normal     Neuro/Psych negative neurological ROS     GI/Hepatic negative GI ROS, Neg liver ROS,,,  Endo/Other  Hypothyroidism    Renal/GU negative Renal ROS     Musculoskeletal  (+) Arthritis ,    Abdominal   Peds  Hematology negative hematology ROS (+)   Anesthesia Other Findings   Reproductive/Obstetrics                             Lab Results  Component Value Date   WBC 5.8 12/03/2022   HGB 12.6 12/03/2022   HCT 37.2 12/03/2022   MCV 88.8 12/03/2022   PLT 168 12/03/2022   Lab Results  Component Value Date   CREATININE 0.70 12/03/2022   BUN 5 (L) 12/03/2022   NA 138 12/03/2022   K 4.5 12/03/2022   CL 103 12/03/2022   CO2 27 12/03/2022    Anesthesia Physical Anesthesia Plan  ASA: 3  Anesthesia Plan: General   Post-op Pain Management: Tylenol PO (pre-op)*   Induction: Intravenous  PONV Risk Score and Plan: 3 and Dexamethasone, Ondansetron and Treatment may vary due to age or medical condition  Airway Management Planned: Oral ETT and Video Laryngoscope Planned  Additional Equipment:   Intra-op Plan:   Post-operative Plan: Extubation in OR  Informed Consent: I have reviewed the patients History and Physical, chart, labs and discussed the procedure including the risks, benefits and alternatives for the proposed anesthesia with the patient or authorized representative who has indicated his/her understanding and acceptance.     Dental advisory  given  Plan Discussed with:   Anesthesia Plan Comments:         Anesthesia Quick Evaluation

## 2022-12-04 DIAGNOSIS — S72001A Fracture of unspecified part of neck of right femur, initial encounter for closed fracture: Secondary | ICD-10-CM | POA: Diagnosis not present

## 2022-12-04 LAB — CBC
HCT: 27.7 % — ABNORMAL LOW (ref 36.0–46.0)
Hemoglobin: 9.1 g/dL — ABNORMAL LOW (ref 12.0–15.0)
MCH: 30.2 pg (ref 26.0–34.0)
MCHC: 32.9 g/dL (ref 30.0–36.0)
MCV: 92 fL (ref 80.0–100.0)
Platelets: 156 10*3/uL (ref 150–400)
RBC: 3.01 MIL/uL — ABNORMAL LOW (ref 3.87–5.11)
RDW: 12.1 % (ref 11.5–15.5)
WBC: 11.5 10*3/uL — ABNORMAL HIGH (ref 4.0–10.5)
nRBC: 0 % (ref 0.0–0.2)

## 2022-12-04 LAB — BASIC METABOLIC PANEL
Anion gap: 11 (ref 5–15)
BUN: 13 mg/dL (ref 8–23)
CO2: 23 mmol/L (ref 22–32)
Calcium: 8.3 mg/dL — ABNORMAL LOW (ref 8.9–10.3)
Chloride: 101 mmol/L (ref 98–111)
Creatinine, Ser: 1.03 mg/dL — ABNORMAL HIGH (ref 0.44–1.00)
GFR, Estimated: 55 mL/min — ABNORMAL LOW (ref >60)
Glucose, Bld: 107 mg/dL — ABNORMAL HIGH (ref 70–99)
Potassium: 4.2 mmol/L (ref 3.5–5.1)
Sodium: 135 mmol/L (ref 135–145)

## 2022-12-04 NOTE — Evaluation (Signed)
Occupational Therapy Evaluation Patient Details Name: Sarah [name] MRN: [id number] DOB: 1941-05-03 Today's Date: 12/04/2022   History of Present Illness 81 y.o. female presents to [facility] hospital on 12/02/2022 after a fall. Pt found to have a closed impacted R femoral neck fx. Pt underwent R THA on 12/03/2022. PMH includes HTN, CAD, TIA, recurrent syncope.   Clinical Impression   Pt reports independence at baseline with ADLs and functional mobility, lives with spouse who is blind and she provides assistance for, but reports her daughter will be staying with them at d/c to assist. Pt currently needing set up -mod A for ADLs, supervision for bed mobility, and min guard A for transfers with RW. Pt educated on AE including reacher and sock aid for LB dressing, pt able to demonstrate and verbalizes understanding. Pt presenting with impairments listed below, will follow acutely. Recommend HHOT at d/c.      Recommendations for follow up therapy are one component of a multi-disciplinary discharge planning process, led by the attending physician.  Recommendations may be updated based on patient status, additional functional criteria and insurance authorization.   Follow Up Recommendations  Home health OT     Assistance Recommended at Discharge Set up Supervision/Assistance  Patient can return home with the following Assistance with cooking/housework;Direct supervision/assist for medications management;Direct supervision/assist for financial management;Help with stairs or ramp for entrance;A little help with walking and/or transfers;A lot of help with bathing/dressing/bathroom    Functional Status Assessment  Patient has had a recent decline in their functional status and demonstrates the ability to make significant improvements in function in a reasonable and predictable amount of time.  Equipment Recommendations  BSC/3in1    Recommendations for Other Services PT consult     Precautions /  Restrictions Precautions Precautions: Fall Precaution Comments: no hip precautions Restrictions Weight Bearing Restrictions: Yes RLE Weight Bearing: Weight bearing as tolerated      Mobility Bed Mobility Overal bed mobility: Needs Assistance Bed Mobility: Supine to Sit, Sit to Supine     Supine to sit: Supervision, HOB elevated Sit to supine: Supervision        Transfers Overall transfer level: Needs assistance Equipment used: Rolling walker (2 wheels) Transfers: Sit to/from Stand Sit to Stand: Min guard                  Balance Overall balance assessment: Needs assistance Sitting-balance support: Feet supported Sitting balance-Leahy Scale: Normal     Standing balance support: Bilateral upper extremity supported, During functional activity, Reliant on assistive device for balance Standing balance-Leahy Scale: Poor Standing balance comment: reliant on RW support                           ADL either performed or assessed with clinical judgement   ADL Overall ADL's : Needs assistance/impaired Eating/Feeding: Set up;Sitting   Grooming: Set up;Sitting   Upper Body Bathing: Minimal assistance;Sitting   Lower Body Bathing: Moderate assistance;Cueing for compensatory techniques;With adaptive equipment;Sitting/lateral leans   Upper Body Dressing : Minimal assistance;Sitting   Lower Body Dressing: Minimal assistance;With adaptive equipment;Cueing for compensatory techniques;Sitting/lateral leans   Toilet Transfer: Min guard;Ambulation;Regular Toilet;Rolling walker (2 wheels)   Toileting- Clothing Manipulation and Hygiene: Supervision/safety       Functional mobility during ADLs: Min guard;Rolling walker (2 wheels)       Vision   Vision Assessment?: No apparent visual deficits     Perception Perception Perception Tested?: No   Praxis Praxis  Praxis tested?: Not tested    Pertinent Vitals/Pain Pain Assessment Pain Assessment: No/denies  pain     Hand Dominance     Extremity/Trunk Assessment Upper Extremity Assessment Upper Extremity Assessment: Generalized weakness   Lower Extremity Assessment Lower Extremity Assessment: Defer to PT evaluation RLE Deficits / Details: grossly 4-/5   Cervical / Trunk Assessment Cervical / Trunk Assessment: Normal   Communication Communication Communication: No difficulties   Cognition Arousal/Alertness: Awake/alert Behavior During Therapy: WFL for tasks assessed/performed Overall Cognitive Status: Within Functional Limits for tasks assessed                                       General Comments  VSS on RA    Exercises     Shoulder Instructions      Home Living Family/patient expects to be discharged to:: Private residence Living Arrangements: Spouse/significant other;Children Available Help at Discharge: Family;Available PRN/intermittently Type of Home: House Home Access: Stairs to enter Entrance Stairs-Number of Steps: 3 Entrance Stairs-Rails: Left Home Layout: One level     Bathroom Shower/Tub: Occupational psychologist: Handicapped height     Home Equipment: Conservation officer, nature (2 wheels);Shower seat;Grab bars - tub/shower          Prior Functioning/Environment Prior Level of Function : Independent/Modified Independent;Driving             Mobility Comments: independent without use of DME ADLs Comments: ind with ADLs, IADLs        OT Problem List: Decreased strength;Decreased range of motion;Decreased activity tolerance;Impaired balance (sitting and/or standing);Decreased safety awareness      OT Treatment/Interventions: Self-care/ADL training;Therapeutic exercise;Energy conservation;DME and/or AE instruction;Therapeutic activities;Patient/family education;Visual/perceptual remediation/compensation;Balance training    OT Goals(Current goals can be found in the care plan section) Acute Rehab OT Goals Patient Stated Goal: none  stated OT Goal Formulation: With patient Time For Goal Achievement: 12/18/22 Potential to Achieve Goals: Good ADL Goals Pt Will Perform Upper Body Dressing: with set-up;sitting Pt Will Perform Lower Body Dressing: with supervision;sitting/lateral leans;with adaptive equipment Pt Will Transfer to Toilet: with supervision;ambulating;regular height toilet  OT Frequency: Min 2X/week    Co-evaluation              AM-PAC OT "6 Clicks" Daily Activity     Outcome Measure Help from another person eating meals?: None Help from another person taking care of personal grooming?: A Little Help from another person toileting, which includes using toliet, bedpan, or urinal?: A Little Help from another person bathing (including washing, rinsing, drying)?: A Lot Help from another person to put on and taking off regular upper body clothing?: A Little Help from another person to put on and taking off regular lower body clothing?: A Lot 6 Click Score: 17   End of Session Equipment Utilized During Treatment: Gait belt;Rolling walker (2 wheels) Nurse Communication: Mobility status  Activity Tolerance: Patient tolerated treatment well Patient left: in bed;with call bell/phone within reach;with bed alarm set  OT Visit Diagnosis: Other abnormalities of gait and mobility (R26.89);Unsteadiness on feet (R26.81);Muscle weakness (generalized) (M62.81);History of falling (Z91.81)                Time: 7517-0017 OT Time Calculation (min): 23 min Charges:  OT General Charges $OT Visit: 1 Visit OT Evaluation $OT Eval Moderate Complexity: 1 Mod OT Treatments $Self Care/Home Management : 8-22 mins  Renaye Rakers, OTD, OTR/L SecureChat Preferred Acute Rehab (  [phone]  Ulla Gallo 12/04/2022, 2:58 PM

## 2022-12-04 NOTE — Evaluation (Signed)
Physical Therapy Evaluation Patient Details Name: Sarah [name] MRN: [id number] DOB: January 09, 1941 Today's Date: 12/04/2022  History of Present Illness  81 y.o. female presents to [facility] hospital on 12/02/2022 after a fall. Pt found to have a closed impacted R femoral neck fx. Pt underwent R THA on 12/03/2022. PMH includes HTN, CAD, TIA, recurrent syncope.  Clinical Impression  Pt presents to PT with deficits in functional mobility, gait, balance, strength, power, endurance, and pain. Pt is mobilizing well, able to transfer and ambulate short distances without physical assistance from this PT. PT encourages aggressive mobilization in an effort to improve strength and activity tolerance. PT recommends discharge home with HHPT, however the pt will likely be unable to assist her spouse (pt's spouse is blind) in his ADLs as usual at the time of discharge. If the pt's children are unable to assist during the initial days home then hired care may be needed temporarily to provide support for the pt and her spouse       Recommendations for follow up therapy are one component of a multi-disciplinary discharge planning process, led by the attending physician.  Recommendations may be updated based on patient status, additional functional criteria and insurance authorization.  Follow Up Recommendations Home health PT      Assistance Recommended at Discharge Intermittent Supervision/Assistance  Patient can return home with the following  A little help with bathing/dressing/bathroom;Assistance with cooking/housework;Assist for transportation;Help with stairs or ramp for entrance    Equipment Recommendations BSC/3in1  Recommendations for Other Services       Functional Status Assessment Patient has had a recent decline in their functional status and demonstrates the ability to make significant improvements in function in a reasonable and predictable amount of time.     Precautions / Restrictions  Precautions Precautions: Fall Precaution Comments: no hip precautions Restrictions Weight Bearing Restrictions: Yes RLE Weight Bearing: Weight bearing as tolerated      Mobility  Bed Mobility Overal bed mobility: Needs Assistance Bed Mobility: Supine to Sit     Supine to sit: Supervision, HOB elevated     General bed mobility comments: increased time and use of rails    Transfers Overall transfer level: Needs assistance Equipment used: Rolling walker (2 wheels) Transfers: Sit to/from Stand Sit to Stand: Min guard                Ambulation/Gait Ambulation/Gait assistance: Min guard Gait Distance (Feet): 40 Feet Assistive device: Rolling walker (2 wheels) Gait Pattern/deviations: Step-through pattern, Decreased stance time - right Gait velocity: reduced Gait velocity interpretation: <1.31 ft/sec, indicative of household ambulator   General Gait Details: slowed step-through gait, reduced stance time on RLE  Stairs            Wheelchair Mobility    Modified Rankin (Stroke Patients Only)       Balance Overall balance assessment: Needs assistance Sitting-balance support: Feet supported Sitting balance-Leahy Scale: Normal     Standing balance support: Single extremity supported, Bilateral upper extremity supported, Reliant on assistive device for balance Standing balance-Leahy Scale: Poor                               Pertinent Vitals/Pain Pain Assessment Pain Assessment: Faces Faces Pain Scale: Hurts little more Pain Location: RLE Pain Descriptors / Indicators: Aching Pain Intervention(s): Monitored during session    Home Living Family/patient expects to be discharged to:: Private residence Living Arrangements: Spouse/significant other;Children (daughter is  there until tuesday per pt. spouse is blind and requires assistance with cooking, dressing, transport) Available Help at Discharge: Family;Available PRN/intermittently Type of  Home: House Home Access: Stairs to enter Entrance Stairs-Rails: Left Entrance Stairs-Number of Steps: 3   Home Layout: One level (basement, stair lift to basement) Home Equipment: Conservation officer, nature (2 wheels)      Prior Function Prior Level of Function : Independent/Modified Independent;Driving             Mobility Comments: independent without use of DME       Hand Dominance        Extremity/Trunk Assessment   Upper Extremity Assessment Upper Extremity Assessment: Overall WFL for tasks assessed    Lower Extremity Assessment Lower Extremity Assessment: RLE deficits/detail RLE Deficits / Details: grossly 4-/5    Cervical / Trunk Assessment Cervical / Trunk Assessment: Normal  Communication   Communication: No difficulties  Cognition Arousal/Alertness: Awake/alert Behavior During Therapy: WFL for tasks assessed/performed Overall Cognitive Status: Within Functional Limits for tasks assessed                                          General Comments General comments (skin integrity, edema, etc.): VSS on RA    Exercises     Assessment/Plan    PT Assessment Patient needs continued PT services  PT Problem List Decreased strength;Decreased activity tolerance;Decreased balance;Decreased mobility;Pain       PT Treatment Interventions DME instruction;Stair training;Gait training;Functional mobility training;Therapeutic activities;Balance training;Neuromuscular re-education;Patient/family education    PT Goals (Current goals can be found in the Care Plan section)  Acute Rehab PT Goals Patient Stated Goal: to return home PT Goal Formulation: With patient Time For Goal Achievement: 12/18/22 Potential to Achieve Goals: Good    Frequency Min 5X/week     Co-evaluation               AM-PAC PT "6 Clicks" Mobility  Outcome Measure Help needed turning from your back to your side while in a flat bed without using bedrails?: A Little Help needed  moving from lying on your back to sitting on the side of a flat bed without using bedrails?: A Little Help needed moving to and from a bed to a chair (including a wheelchair)?: A Little Help needed standing up from a chair using your arms (e.g., wheelchair or bedside chair)?: A Little Help needed to walk in hospital room?: A Little Help needed climbing 3-5 steps with a railing? : A Lot 6 Click Score: 17    End of Session   Activity Tolerance: Patient tolerated treatment well Patient left: in chair;with call bell/phone within reach;with chair alarm set Nurse Communication: Mobility status PT Visit Diagnosis: Other abnormalities of gait and mobility (R26.89);Muscle weakness (generalized) (M62.81)    Time: 4315-4008 PT Time Calculation (min) (ACUTE ONLY): 24 min   Charges:   PT Evaluation $PT Eval Low Complexity: [place], PT, DPT Acute Rehabilitation Office [phone]   Zenaida Niece 12/04/2022, 1:19 PM

## 2022-12-04 NOTE — Progress Notes (Signed)
PROGRESS NOTE  JERRY HAUGEN  UYQ:[id number] DOB: 1941-07-19 DOA: 12/02/2022 PCP: Christain Sacramento, MD   Brief Narrative: Patient is a 81 year old female with history of hypertension, hypothyroidism, coronary disease, TIA, recurrent syncope presented from home after she fell at home on the stairs on her right hip with immediate development of pain and could not get up.  On presentation she was hemodynamically stable.  Labs showed WBC of 12.9.  X-ray of the hip showed impacted right femoral neck fracture.  X-ray of left ankle showed soft tissue swelling but no fracture.  Head CT, cervical CT was negative for fracture or dislocation.  Ortho was consulted.  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  PT recommended home health.  Possible plan for discharge tomorrow  Assessment & Plan:  Principal Problem:   Closed displaced fracture of right femoral neck (HCC) Active Problems:   Leukocytosis   Hyponatremia   Essential hypertension   Hypothyroidism   Recurrent syncope  Close displaced fracture of right femoral neck: Presented after a fall at home.  Orthopedics consulted.  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   chemical DVT prophylaxis started with aspirin.  PT recommending home health.   Continue pain medications , bowel regimen  Acute blood loss anemia: Hemoglobin dropped with range of 9.  Check CBC tomorrow.  Transfuse if hemoglobin drops less than 7  Leukocytosis: Mild most likely reactive. resolved  Hyponatremia: Resolved IV fluids  Hypertension: Not on any medications at home.  Currently well-controlled without any medications  Hypothyroidism: Continue Synthyroid  Recurrent syncope: Followed by neurology.  PT/OT evaluation done, recommend home health  Seizure disorder: On Trileptal         DVT prophylaxis:SCDs Start: 12/03/22 1113     Code Status: Full Code  Family Communication: None at bedside  Patient status:Inpatient  Patient is from  :Home  Anticipated discharge GL:OVFI  Estimated DC date:1-2 days   Consultants: Ortho  Procedures: ORIF  Antimicrobials:  Anti-infectives (From admission, onward)    Start     Dose/Rate Route Frequency Ordered Stop   12/03/22 1300  ceFAZolin (ANCEF) IVPB 2g/100 mL premix        2 g 200 mL/hr over 30 Minutes Intravenous Every 6 hours 12/03/22 1112 12/03/22 1849   12/03/22 0638  vancomycin (VANCOCIN) 1-5 GM/200ML-% IVPB       Note to Pharmacy: Altamese Tappan: cabinet override      12/03/22 0638 12/03/22 0805   12/03/22 0600  vancomycin (VANCOCIN) IVPB 1000 mg/200 mL premix        1,000 mg 200 mL/hr over 60 Minutes Intravenous On call to O.R. 12/02/22 1834 12/03/22 0847       Subjective:  Patient seen and examined at bedside today.  Hemodynamically stable.  Pain Well-controlled.  Lying in bed.  She says she does not want to go to SNF and wants to go home to help her blind husband.   Objective: Vitals:   12/03/22 1431 12/03/22 2000 12/04/22 0318 12/04/22 0814  BP: 115/75 124/79 111/69 (!) 113/53  Pulse: 64 63 71 79  Resp:   18   Temp: 98.1 F (36.7 C) 98.2 F (36.8 C) 98.3 F (36.8 C) 98.3 F (36.8 C)  TempSrc: Oral Oral Oral Oral  SpO2: 100% 100% 98% 97%  Weight:      Height:        Intake/Output Summary (Last 24 hours) at 12/04/2022 1427 Last data filed at 12/04/2022 0600 Gross per 24  hour  Intake 120 ml  Output 1175 ml  Net -1055 ml   Filed Weights   12/02/22 1738 12/03/22 0645  Weight: 68.3 kg 68.3 kg    Examination:  General exam: Overall comfortable, not in distress HEENT: PERRL Respiratory system:  no wheezes or crackles  Cardiovascular system: S1 & S2 heard, RRR.  Gastrointestinal system: Abdomen is nondistended, soft and nontender. Central nervous system: Alert and oriented Extremities: No edema, no clubbing ,no cyanosis, surgical wound on the right hip Skin: No rashes, no ulcers,no icterus     Data Reviewed: I have personally reviewed  following labs and imaging studies  CBC: Recent Labs  Lab 12/02/22 1230 12/03/22 0347 12/04/22 0145  WBC 12.9* 5.8 11.5*  HGB 12.5 12.6 9.1*  HCT 37.3 37.2 27.7*  MCV 91.0 88.8 92.0  PLT 160 168 740   Basic Metabolic Panel: Recent Labs  Lab 12/02/22 1230 12/03/22 0347 12/04/22 0145  NA 131* 138 135  K 4.1 4.5 4.2  CL 102 103 101  CO2 '22 27 23  '$ GLUCOSE 115* 108* 107*  BUN 10 5* 13  CREATININE 0.58 0.70 1.03*  CALCIUM 8.6* 9.3 8.3*     Recent Results (from the past 240 hour(s))  Surgical pcr screen     Status: None   Collection Time: 12/03/22  1:36 AM   Specimen: Nasal Mucosa; Nasal Swab  Result Value Ref Range Status   MRSA, PCR NEGATIVE NEGATIVE Final   Staphylococcus aureus NEGATIVE NEGATIVE Final    Comment: (NOTE) The Xpert SA Assay (FDA approved for NASAL specimens in patients 67 years of age and older), is one component of a comprehensive surveillance program. It is not intended to diagnose infection nor to guide or monitor treatment. Performed at [facility], [place] [address] [place], [place] [address]      Radiology Studies: DG Pelvis Portable  Result Date: 12/03/2022 CLINICAL DATA:  Right total hip arthroplasty EXAM: PORTABLE PELVIS 1-2 VIEWS COMPARISON:  12/02/2022 FINDINGS: Interval postsurgical changes from right total hip arthroplasty. Arthroplasty components are in their expected alignment. No periprosthetic fracture or evidence of other complication. Expected postoperative changes within the overlying soft tissues. IMPRESSION: Satisfactory postoperative appearance of right total hip arthroplasty. Electronically Signed   By: Davina Poke D.O.   On: 12/03/2022 10:02   DG HIP UNILAT WITH PELVIS 2-3 VIEWS RIGHT  Result Date: 12/03/2022 CLINICAL DATA:  Right hip replacement. Fluoroscopy: 9.2 seconds. Images: 5 Cumulative air kerma: 0.9173 mGy. EXAM: DG HIP (WITH OR WITHOUT PELVIS) 2-3V RIGHT COMPARISON:  None Available. FINDINGS: Five images  were obtained during a right hip replacement. By the end of the study, the acetabular and femoral components of the new right hip replacement are in good position. IMPRESSION: Right hip replacement as above. Electronically Signed   By: Dorise Bullion III M.D.   On: 12/03/2022 09:54   DG C-Arm 1-60 Min-No Report  Result Date: 12/03/2022 Fluoroscopy was utilized by the requesting physician.  No radiographic interpretation.   DG C-Arm 1-60 Min-No Report  Result Date: 12/03/2022 Fluoroscopy was utilized by the requesting physician.  No radiographic interpretation.   DG Knee Right Port  Result Date: 12/02/2022 CLINICAL DATA:  Right femoral neck closed fracture following a fall. EXAM: PORTABLE RIGHT KNEE - 1-2 VIEW COMPARISON:  12/18/2013. FINDINGS: Again demonstrated is a right total knee prosthesis. There is a linear avulsion fracture fragment adjacent to the medial femoral condyle. This is corticated. No acute fracture, dislocation or effusion. IMPRESSION: 1.  No acute fracture. 2. Chronic avulsion fracture of the medial femoral condyle. Electronically Signed   By: Claudie Revering M.D.   On: 12/02/2022 22:41    Scheduled Meds:  aspirin  81 mg Oral BID   docusate sodium  100 mg Oral BID   estradiol  0.5 mg Oral Q breakfast   ferrous sulfate  325 mg Oral Daily   levothyroxine  75 mcg Oral BH-q7a   OXcarbazepine  150 mg Oral BID   polyethylene glycol  17 g Oral Daily   senna  1 tablet Oral BID   senna-docusate  1 tablet Oral BID   Continuous Infusions:  methocarbamol (ROBAXIN) IV       LOS: 2 days   Shelly Coss, MD [facility] P12/31/2023, 2:27 PM

## 2022-12-04 NOTE — Progress Notes (Signed)
Sarah [name]  MRN: [id number] DOB/Age: 80-07-1941 81 y.o. Physician: Ander Slade, M.D. 1 Day Post-Op Procedure(s) (LRB): TOTAL HIP ARTHROPLASTY ANTERIOR APPROACH (Right)  Subjective: Resting comfortably in bed this morning.  Alert and oriented.  Reports minimal hip pain. Vital Signs Temp:  [96.4 F (35.8 C)-98.3 F (36.8 C)] 98.3 F (36.8 C) (12/31 0814) Pulse Rate:  [56-79] 79 (12/31 0814) Resp:  [10-18] 18 (12/31 0318) BP: (74-124)/(39-79) 113/53 (12/31 0814) SpO2:  [95 %-100 %] 97 % (12/31 0814)  Lab Results Recent Labs    12/03/22 0347 12/04/22 0145  WBC 5.8 11.5*  HGB 12.6 9.1*  HCT 37.2 27.7*  PLT 168 156   BMET Recent Labs    12/03/22 0347 12/04/22 0145  NA 138 135  K 4.5 4.2  CL 103 101  CO2 27 23  GLUCOSE 108* 107*  BUN 5* 13  CREATININE 0.70 1.03*  CALCIUM 9.3 8.3*   INR  Date Value Ref Range Status  12/02/2022 1.0 0.8 - 1.2 Final    Comment:    (NOTE) INR goal varies based on device and disease states. Performed at [facility], [place] [address], [place], [place] [address]      Exam  Right hip dressing clean and dry.  Thigh compartments soft.  Grossly neurovascular intact distally in the right lower extremity.  Impression:  Status post right hip arthroplasty via anterior approach for treatment of displaced femoral neck fracture.  Plan Begin physical therapy per postop protocol.  Patient may be weightbearing as tolerated to the right lower extremity.  Daily ASA for DVT prophylaxis.  Continue medical management per hospitalist service.  Patient expresses desire to return home with home health PT   [facility] Duilio Heritage 12/04/2022, 9:28 AM   Contact # [phone]

## 2022-12-04 NOTE — TOC CAGE-AID Note (Signed)
Transition of Care [facility]) - CAGE-AID Screening   Patient Details  Name: Sarah [name] MRN: [id number] Date of Birth: 1941-01-25  Transition of [facility]) CM/SW Contact:    Army Melia, RN Phone [phone] 12/04/2022, 10:13 PM   Clinical Narrative:  No hx of drug/alcohol use, no resources indicated.   CAGE-AID Screening:    Have You Ever Felt You Ought to Cut Down on Your Drinking or Drug Use?: No Have People Annoyed You By Critizing Your Drinking Or Drug Use?: No Have You Felt Bad Or Guilty About Your Drinking Or Drug Use?: No Have You Ever Had a Drink or Used Drugs First Thing In The Morning to Steady Your Nerves or to Get Rid of a Hangover?: No CAGE-AID Score: 0  Substance Abuse Education Offered: No

## 2022-12-05 DIAGNOSIS — S72001A Fracture of unspecified part of neck of right femur, initial encounter for closed fracture: Secondary | ICD-10-CM | POA: Diagnosis not present

## 2022-12-05 LAB — IRON AND TIBC
Iron: 12 ug/dL — ABNORMAL LOW (ref 28–170)
Saturation Ratios: 8 % — ABNORMAL LOW (ref 10.4–31.8)
TIBC: 157 ug/dL — ABNORMAL LOW (ref 250–450)
UIBC: 145 ug/dL

## 2022-12-05 LAB — BASIC METABOLIC PANEL
Anion gap: 10 (ref 5–15)
BUN: 17 mg/dL (ref 8–23)
CO2: 21 mmol/L — ABNORMAL LOW (ref 22–32)
Calcium: 8.3 mg/dL — ABNORMAL LOW (ref 8.9–10.3)
Chloride: 101 mmol/L (ref 98–111)
Creatinine, Ser: 1.04 mg/dL — ABNORMAL HIGH (ref 0.44–1.00)
GFR, Estimated: 54 mL/min — ABNORMAL LOW (ref >60)
Glucose, Bld: 115 mg/dL — ABNORMAL HIGH (ref 70–99)
Potassium: 4.1 mmol/L (ref 3.5–5.1)
Sodium: 132 mmol/L — ABNORMAL LOW (ref 135–145)

## 2022-12-05 LAB — CBC
HCT: 25 % — ABNORMAL LOW (ref 36.0–46.0)
Hemoglobin: 8.2 g/dL — ABNORMAL LOW (ref 12.0–15.0)
MCH: 30.3 pg (ref 26.0–34.0)
MCHC: 32.8 g/dL (ref 30.0–36.0)
MCV: 92.3 fL (ref 80.0–100.0)
Platelets: 130 10*3/uL — ABNORMAL LOW (ref 150–400)
RBC: 2.71 MIL/uL — ABNORMAL LOW (ref 3.87–5.11)
RDW: 12.4 % (ref 11.5–15.5)
WBC: 8.4 10*3/uL (ref 4.0–10.5)
nRBC: 0 % (ref 0.0–0.2)

## 2022-12-05 MED ORDER — HYDROCODONE-ACETAMINOPHEN 7.5-325 MG PO TABS: 1.0000 | ORAL_TABLET | ORAL | 0 refills | Status: DC | PRN

## 2022-12-05 MED ORDER — SENNOSIDES-DOCUSATE SODIUM 8.6-50 MG PO TABS: 1.0000 | ORAL_TABLET | Freq: Two times a day (BID) | ORAL | 0 refills | Status: AC

## 2022-12-05 MED ORDER — POLYETHYLENE GLYCOL 3350 17 G PO PACK: 17.0000 g | PACK | Freq: Every day | ORAL | 0 refills | Status: DC

## 2022-12-05 MED ORDER — FERROUS FUMARATE 324 (106 FE) MG PO TABS: 1.0000 | ORAL_TABLET | Freq: Every day | ORAL | Status: DC

## 2022-12-05 MED ORDER — SODIUM CHLORIDE 0.9 % IV SOLN
510.0000 mg | Freq: Once | INTRAVENOUS | Status: AC
  Administered 2022-12-05: 510 mg via INTRAVENOUS
  Filled 2022-12-05: qty 17

## 2022-12-05 MED ORDER — PHENYLEPHRINE HCL-NACL 20-0.9 MG/250ML-% IV SOLN
INTRAVENOUS | Status: AC
  Filled 2022-12-05: qty 250

## 2022-12-05 MED ORDER — PROPOFOL 1000 MG/100ML IV EMUL
INTRAVENOUS | Status: AC
  Filled 2022-12-05: qty 100

## 2022-12-05 MED ORDER — ASPIRIN 81 MG PO CHEW: 81.0000 mg | CHEWABLE_TABLET | Freq: Two times a day (BID) | ORAL | 0 refills | Status: AC

## 2022-12-05 NOTE — TOC Transition Note (Signed)
Transition of Care [facility]) - CM/SW Discharge Note   Patient Details  Name: Sarah [name] MRN: [id number] Date of Birth: Mar 04, 1941  Transition of Care [facility]) CM/SW Contact:  Curlene Labrum, RN Phone Number: 12/05/2022, 11:46 AM   Clinical Narrative:    CM met with the patient and son at the bedside to discuss transitions of care to home today.  The patient lives with her spouse at the home that is blind.  The patient was given Medicare choice regarding home health services and she did not have a preference.    I called [facility] and home health PT/OT and aide were set up.  The patient has RW at the home but will need a 3:1.  The patient was given choice but did not have a preference.  Jermaine CM with [employer] was called and 3:1 will be delivered to the hospital room.  The patient's son was given contact information are are home health companies that provide personal care services.  The patient's family will follow up.  The patient's daughter will be providing transportation to home via car.   Final next level of care: Home w Home Health Services Barriers to Discharge: No Barriers Identified   Patient Goals and CMS Choice CMS Medicare.gov Compare Post Acute Care list provided to:: Patient Choice offered to / list presented to : Patient  Discharge Placement                         Discharge Plan and Services Additional resources added to the After Visit Summary for     Discharge Planning Services: CM Consult Post Acute Care Choice: Home Health          DME Arranged: 3-N-1 DME Agency:  [facility] Aver) Date DME Agency Contacted: 12/05/22 Time DME Agency Contacted: [phone] Representative spoke with at DME Agency: Brenton Grills, [place] with [place] Arranged: PT, OT, Nurse's Aide [place] Agency: [place] Date [facility] Agency Contacted: 12/05/22 Time [place]: [id number] Representative spoke with at [place]: Tommi Rumps, [place] with [facility]  Social Determinants of  Health (SDOH) Interventions SDOH Screenings   Food Insecurity: No Food Insecurity (12/02/2022)  Housing: Low Risk  (12/02/2022)  Transportation Needs: No Transportation Needs (12/02/2022)  Utilities: Not At Risk (12/02/2022)  Tobacco Use: Low Risk  (12/03/2022)     Readmission Risk Interventions    12/05/2022   11:46 AM  Readmission Risk Prevention Plan  Post Dischage Appt Complete  Medication Screening Complete  Transportation Screening Complete

## 2022-12-05 NOTE — Progress Notes (Signed)
    Subjective:  Patient reports pain as mild.  Denies N/V/CP/SOB/Abd pain. She denies any tingling or numbness in LE bilaterally. Reports little to no pain in her right lower extremity. Family at bedside.   Objective:   VITALS:   Vitals:   12/04/22 0814 12/04/22 1945 12/05/22 0349 12/05/22 0736  BP: (!) 113/53 (!) 101/57 (!) 108/57 (!) 119/57  Pulse: 79 88 86 81  Resp:  18 16   Temp: 98.3 F (36.8 C) 98.1 F (36.7 C) 98.3 F (36.8 C) (!) 97.4 F (36.3 C)  TempSrc: Oral Oral Oral Oral  SpO2: 97% 98% 100% 97%  Weight:      Height:        Patient is sitting up in recliner. NAD.  Neurologically intact ABD soft Neurovascular intact Sensation intact distally Intact pulses distally Dorsiflexion/Plantar flexion intact Incision: dressing C/D/I No cellulitis present Compartment soft   Lab Results  Component Value Date   WBC 8.4 12/05/2022   HGB 8.2 (L) 12/05/2022   HCT 25.0 (L) 12/05/2022   MCV 92.3 12/05/2022   PLT 130 (L) 12/05/2022   BMET    Component Value Date/Time   NA 132 (L) 12/05/2022 0224   NA 140 01/06/2021 1447   K 4.1 12/05/2022 0224   CL 101 12/05/2022 0224   CO2 21 (L) 12/05/2022 0224   GLUCOSE 115 (H) 12/05/2022 0224   BUN 17 12/05/2022 0224   BUN 11 01/06/2021 1447   CREATININE 1.04 (H) 12/05/2022 0224   CALCIUM 8.3 (L) 12/05/2022 0224   GFRNONAA 54 (L) 12/05/2022 0224     Assessment/Plan: 2 Days Post-Op   Principal Problem:   Closed displaced fracture of right femoral neck (HCC) Active Problems:   Essential hypertension   Hypothyroidism   Leukocytosis   Hyponatremia   Recurrent syncope   WBAT with walker DVT ppx: Aspirin, SCDs, TEDS PO pain control PT/OT: She ambulated 40 feet with PT yesterday and 160 feet with PT today. She states she has a rolling walker at home.  Dispo: Patient under care of the medical team, disposition per their recommendation. Discussion about discharge home with HHPT. Pain medication and DVT ppx sent to  [facility] in [place] [facility] per patient request.   Charlott Rakes, PA-C 12/05/2022, 1:21 PM   [facility]  [place] [address], [address], [place],  [address] Phone: [phone] [email or web address] Facebook  [employer]

## 2022-12-05 NOTE — Progress Notes (Signed)
Physical Therapy Treatment Patient Details Name: Sarah [name] MRN: [id number] DOB: 03-27-1941 Today's Date: 12/05/2022   History of Present Illness 82 y.o. female presents to [facility] hospital on 12/02/2022 after a fall. Pt found to have a closed impacted R femoral neck fx. Pt underwent R THA on 12/03/2022. PMH includes HTN, CAD, TIA, recurrent syncope.    PT Comments    Pt pleasant and reports mild pain with good strength post op. Pt able to progress gait distance limited by fatigue and performed stairs. Pt states daughter will return to [place] tomorrow evening and she will not have assist for meals unless church family is able to assist. Discussed quick prep meals. Pt encouraged to be up to toilet and not use purewick. Will continue to follow but appropriate for return home.     Recommendations for follow up therapy are one component of a multi-disciplinary discharge planning process, led by the attending physician.  Recommendations may be updated based on patient status, additional functional criteria and insurance authorization.  Follow Up Recommendations  Home health PT     Assistance Recommended at Discharge Intermittent Supervision/Assistance  Patient can return home with the following A little help with bathing/dressing/bathroom;Assistance with cooking/housework;Assist for transportation;Help with stairs or ramp for entrance   Equipment Recommendations  BSC/3in1    Recommendations for Other Services       Precautions / Restrictions Precautions Precautions: Fall Restrictions RLE Weight Bearing: Weight bearing as tolerated     Mobility  Bed Mobility Overal bed mobility: Modified Independent             General bed mobility comments: increased time with bed flat    Transfers Overall transfer level: Modified independent                 General transfer comment: reliant on bil UE but able to control rise and descent from bed, toilet and chair (x 3)     Ambulation/Gait Ambulation/Gait assistance: Min guard Gait Distance (Feet): 160 Feet Assistive device: Rolling walker (2 wheels) Gait Pattern/deviations: Step-through pattern, Decreased stance time - right   Gait velocity interpretation: 1.31 - 2.62 ft/sec, indicative of limited community ambulator   General Gait Details: step through pattern, decreased speed. Pt with seated rest after 160' due to fatigue and then walked additional 30' x 2 trials with seated rest at each and return to room via chair   Stairs Stairs: Yes Stairs assistance: Supervision Stair Management: Step to pattern, Forwards, One rail Left Number of Stairs: 3 General stair comments: pt able to complete stairs safely with one rail   Wheelchair Mobility    Modified Rankin (Stroke Patients Only)       Balance Overall balance assessment: Mild deficits observed, not formally tested   Sitting balance-Leahy Scale: Good     Standing balance support: Reliant on assistive device for balance, Bilateral upper extremity supported Standing balance-Leahy Scale: Poor Standing balance comment: reliant on RW support                            Cognition Arousal/Alertness: Awake/alert Behavior During Therapy: WFL for tasks assessed/performed Overall Cognitive Status: Within Functional Limits for tasks assessed                                          Exercises      General Comments  Pertinent Vitals/Pain Pain Assessment Faces Pain Scale: Hurts a little bit Pain Location: RLE Pain Descriptors / Indicators: Sore Pain Intervention(s): Limited activity within patient's tolerance, Repositioned, Monitored during session    Home Living                          Prior Function            PT Goals (current goals can now be found in the care plan section) Progress towards PT goals: Progressing toward goals    Frequency    Min 5X/week      PT Plan Current  plan remains appropriate    Co-evaluation              AM-PAC PT "6 Clicks" Mobility   Outcome Measure  Help needed turning from your back to your side while in a flat bed without using bedrails?: None Help needed moving from lying on your back to sitting on the side of a flat bed without using bedrails?: None Help needed moving to and from a bed to a chair (including a wheelchair)?: None Help needed standing up from a chair using your arms (e.g., wheelchair or bedside chair)?: A Little Help needed to walk in hospital room?: A Little Help needed climbing 3-5 steps with a railing? : A Little 6 Click Score: 21    End of Session   Activity Tolerance: Patient tolerated treatment well Patient left: in chair;with call bell/phone within reach;with chair alarm set Nurse Communication: Mobility status PT Visit Diagnosis: Other abnormalities of gait and mobility (R26.89);Muscle weakness (generalized) (M62.81)     Time: 5456-2563 PT Time Calculation (min) (ACUTE ONLY): 28 min  Charges:  $Gait Training: 8-22 mins $Therapeutic Activity: 8-22 mins                     Bayard Males, PT Acute Rehabilitation Services Office: [place] 12/05/2022, 8:51 AM

## 2022-12-05 NOTE — Discharge Summary (Signed)
Physician Discharge Summary  Sarah [name] YQI:[id number] DOB: 1940/12/23 DOA: 12/02/2022  PCP: Christain Sacramento, MD  Admit date: 12/02/2022 Discharge date: 12/05/2022  Admitted From: Home Disposition:  Home  Discharge Condition:Stable CODE STATUS:FULL Diet recommendation: Heart Healthy   Brief/Interim Summary: Patient is a 82 year old female with history of hypertension, hypothyroidism, coronary disease, TIA, recurrent syncope presented from home after she fell at home on the stairs on her right hip with immediate development of pain and could not get up.  On presentation she was hemodynamically stable.  Labs showed WBC of 12.9.  X-ray of the hip showed impacted right femoral neck fracture.  X-ray of left ankle showed soft tissue swelling but no fracture.  Head CT, cervical CT was negative for fracture or dislocation.  Ortho was consulted.  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  PT recommended home health.  Medically stable for discharge home today.  Following problems were addressed during her hospitalization:  Close displaced fracture of right femoral neck: Presented after a fall at home.  Orthopedics consulted.  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   chemical DVT prophylaxis started with aspirin.  PT recommending home health.   Continue pain medications , bowel regimen on DC.  Follow-up with orthopedics in 2 weeks for wound check and x-rays.   Acute blood loss anemia: Hemoglobin dropped with range of 8.  Iron studies show severe iron deficiency, given a dose of iron infusion.  Continue oral iron supplementation at home  Leukocytosis: Mild most likely reactive. resolved   Hyponatremia: Resolved IV fluids   Hypertension: Not on any medications at home.  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  PT/OT evaluation done, recommend home health   Seizure disorder: On  Trileptal     Discharge Diagnoses:  Principal Problem:   Closed displaced fracture of right femoral neck (HCC) Active Problems:   Leukocytosis   Hyponatremia   Essential hypertension   Hypothyroidism   Recurrent syncope    Discharge Instructions  Discharge Instructions     Diet general   Complete by: As directed    Discharge instructions   Complete by: As directed    1)Please take prescribed medications as instructed 2)Follow up with your PCP in a week, do a CBC test during  the follow-up to check your hemoglobin 3)Follow up with orthopedics as an outpatient 2 weeks.  Name and number the provider has been attached   Increase activity slowly   Complete by: As directed       Allergies as of 12/05/2022       Reactions   Atorvastatin Other (See Comments)   Fatigue, leg cramps   Keppra [levetiracetam]    jitteriness   Other    Band-Aid cause patient to swell   Oxycodone-acetaminophen Nausea And Vomiting   Tape Swelling, Other (See Comments)   redness   Penicillins Hives   Did it involve swelling of the face/tongue/throat, SOB, or low BP? No Did it involve sudden or severe rash/hives, skin peeling, or any reaction on the inside of your mouth or nose? Yes Did you need to seek medical attention at a hospital or doctor's office? Yes When did it last happen?      10+ years If all above answers are "NO", may proceed with cephalosporin use. Tolerated Ancef without difficulty on 26 Jul 2022   Voltaren [diclofenac Sodium] Hives, Rash  Medication List     TAKE these medications    aspirin 81 MG chewable tablet Chew 1 tablet (81 mg total) by mouth 2 (two) times daily for 28 days.   b complex vitamins tablet Take 1 tablet by mouth daily.   CALCIUM + D PO Take 1 tablet by mouth daily.   cyanocobalamin 500 MCG tablet Commonly known as: VITAMIN B12 Take 500 mcg by mouth daily.   estradiol 1 MG tablet Commonly known as: ESTRACE Take 0.5 mg by mouth daily  with breakfast.   fluticasone 50 MCG/ACT nasal spray Commonly known as: FLONASE Place 1 spray into both nostrils as needed for allergies.   HYDROcodone-acetaminophen 7.5-325 MG tablet Commonly known as: NORCO Take 1 tablet by mouth every 4 (four) hours as needed for severe pain (pain score 7-10).   Iron 240 (27 Fe) MG Tabs Take 240 mg by mouth daily.   levothyroxine 75 MCG tablet Commonly known as: SYNTHROID Take 75 mcg by mouth every morning.   OXcarbazepine 150 MG tablet Commonly known as: TRILEPTAL Take 1 tablet (150 mg total) by mouth 2 (two) times daily.   polyethylene glycol 17 g packet Commonly known as: MIRALAX / GLYCOLAX Take 17 g by mouth daily. Start taking on: December 06, 2022   senna-docusate 8.6-50 MG tablet Commonly known as: Senokot-S Take 1 tablet by mouth 2 (two) times daily for 7 days.   SOOTHE XP OP Place 1 drop into both eyes daily as needed (dry eyes).        Follow-up Information     Charlott Rakes, [place]. Schedule an appointment as soon as possible for a visit in 2 week(s).   Specialty: Orthopedic Surgery Why: For wound re-check Contact information: [address], [address] [place] [address] [phone]                Allergies  Allergen Reactions   Atorvastatin Other (See Comments)    Fatigue, leg cramps   Keppra [Levetiracetam]     jitteriness   Other     Band-Aid cause patient to swell   Oxycodone-Acetaminophen Nausea And Vomiting   Tape Swelling and Other (See Comments)    redness   Penicillins Hives    Did it involve swelling of the face/tongue/throat, SOB, or low BP? No Did it involve sudden or severe rash/hives, skin peeling, or any reaction on the inside of your mouth or nose? Yes Did you need to seek medical attention at a hospital or doctor's office? Yes When did it last happen?      10+ years If all above answers are "NO", may proceed with cephalosporin use. Tolerated Ancef without difficulty on 26 Jul 2022    Voltaren [Diclofenac Sodium] Hives and Rash    Consultations: Ortho   Procedures/Studies: DG Pelvis Portable  Result Date: 12/03/2022 CLINICAL DATA:  Right total hip arthroplasty EXAM: PORTABLE PELVIS 1-2 VIEWS COMPARISON:  12/02/2022 FINDINGS: Interval postsurgical changes from right total hip arthroplasty. Arthroplasty components are in their expected alignment. No periprosthetic fracture or evidence of other complication. Expected postoperative changes within the overlying soft tissues. IMPRESSION: Satisfactory postoperative appearance of right total hip arthroplasty. Electronically Signed   By: Davina Poke D.O.   On: 12/03/2022 10:02   DG HIP UNILAT WITH PELVIS 2-3 VIEWS RIGHT  Result Date: 12/03/2022 CLINICAL DATA:  Right hip replacement. Fluoroscopy: 9.2 seconds. Images: 5 Cumulative air kerma: 0.9173 mGy. EXAM: DG HIP (WITH OR WITHOUT PELVIS) 2-3V RIGHT COMPARISON:  None Available. FINDINGS: Five  images were obtained during a right hip replacement. By the end of the study, the acetabular and femoral components of the new right hip replacement are in good position. IMPRESSION: Right hip replacement as above. Electronically Signed   By: Dorise Bullion III M.D.   On: 12/03/2022 09:54   DG C-Arm 1-60 Min-No Report  Result Date: 12/03/2022 Fluoroscopy was utilized by the requesting physician.  No radiographic interpretation.   DG C-Arm 1-60 Min-No Report  Result Date: 12/03/2022 Fluoroscopy was utilized by the requesting physician.  No radiographic interpretation.   DG Knee Right Port  Result Date: 12/02/2022 CLINICAL DATA:  Right femoral neck closed fracture following a fall. EXAM: PORTABLE RIGHT KNEE - 1-2 VIEW COMPARISON:  12/18/2013. FINDINGS: Again demonstrated is a right total knee prosthesis. There is a linear avulsion fracture fragment adjacent to the medial femoral condyle. This is corticated. No acute fracture, dislocation or effusion. IMPRESSION: 1. No acute  fracture. 2. Chronic avulsion fracture of the medial femoral condyle. Electronically Signed   By: Claudie Revering M.D.   On: 12/02/2022 22:41   CT HIP RIGHT WO CONTRAST  Result Date: 12/02/2022 CLINICAL DATA:  Hip trauma. Fracture suspected. Fell onto right hip. EXAM: CT OF THE RIGHT HIP WITHOUT CONTRAST TECHNIQUE: Multidetector CT imaging of the right hip was performed according to the standard protocol. Multiplanar CT image reconstructions were also generated. RADIATION DOSE REDUCTION: This exam was performed according to the departmental dose-optimization program which includes automated exposure control, adjustment of the mA and/or kV according to patient size and/or use of iterative reconstruction technique. COMPARISON:  Pelvis and right hip radiographs 12/02/2022, CT abdomen and pelvis 06/29/2022 FINDINGS: Bones/Joint/Cartilage There is an acute subcapital right femoral neck fracture with approximately 4 mm inferior medial and 6 mm anterior displacement of the distal fracture component with respect to the proximal fracture component. There is mild-to-moderate fracture impaction. The right femoral head remains appropriately located with respect to the right acetabulum. Moderate right femoroacetabular joint space narrowing. Mild-to-moderate superolateral right acetabular degenerative osteophytosis. Moderate pubic symphysis joint space narrowing, subchondral sclerosis, and peripheral osteophytosis. Partial visualization of lower lumbar spine fusion hardware. Mild right sacroiliac subchondral sclerosis, degenerative vacuum phenomenon, and anterior endplate osteophytosis. Ligaments Suboptimally assessed by CT. Muscles and Tendons Normal size and density of the regional musculature. No gross tendon tear is seen. Soft tissues Mild curvilinear density within the posterolateral right buttock/hip subcutaneous fat consistent with soft tissue contusion. No hematoma is seen. Mild atherosclerotic calcifications.  IMPRESSION: 1. Acute subcapital right femoral neck fracture with mild displacement and mild-to-moderate fracture impaction. 2. Moderate right femoroacetabular osteoarthritis. Electronically Signed   By: Yvonne Kendall M.D.   On: 12/02/2022 13:36   DG Chest 2 View  Result Date: 12/02/2022 CLINICAL DATA:  Chest pain.  Fall from chair lift. EXAM: CHEST - 2 VIEW COMPARISON:  Two-view chest x-ray 09/23/2016 FINDINGS: Heart size is normal. 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 No superimposed airspace disease is present. No edema or effusion is present. Scarring at the lung apices is similar the prior study. IMPRESSION: 1. No acute cardiopulmonary disease. 2. 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 Electronically Signed   By: San Morelle M.D.   On: 12/02/2022 12:34   CT Cervical Spine Wo Contrast  Result Date: 12/02/2022 CLINICAL DATA:  Neck trauma (Age >= 65y); Head trauma, moderate-severe EXAM: CT HEAD WITHOUT CONTRAST CT CERVICAL SPINE WITHOUT CONTRAST TECHNIQUE: Multidetector CT imaging of the head and cervical spine was performed following the  standard protocol without intravenous contrast. Multiplanar CT image reconstructions of the cervical spine were also generated. RADIATION DOSE REDUCTION: This exam was performed according to the departmental dose-optimization program which includes automated exposure control, adjustment of the mA and/or kV according to patient size and/or use of iterative reconstruction technique. COMPARISON:  04/17/2020 FINDINGS: CT HEAD FINDINGS Brain: No evidence of acute infarction, hemorrhage, hydrocephalus, extra-axial collection or mass lesion/mass effect. Scattered low-density changes within the periventricular and subcortical white matter compatible with chronic microvascular ischemic change. Mild diffuse cerebral volume loss. Vascular: Atherosclerotic calcifications involving the large vessels of the skull base. No unexpected  hyperdense vessel. Skull: Normal. Negative for fracture or focal lesion. Sinuses/Orbits: No acute finding. Other: None. CT CERVICAL SPINE FINDINGS Alignment: Facet joints are aligned without dislocation or traumatic listhesis. Dens and lateral masses are aligned. Mild grade 1 anterolisthesis with focal kyphosis at C7-T1 is unchanged. Skull base and vertebrae: Extensive interbody fusion throughout the cervical spine with hardware present at the C3-C5 levels and evidence of prior hardware removal at C6 and C7. Solid interbody fusion from C2 to C7. No evidence of fracture. No suspicious lytic or sclerotic bone lesion. Soft tissues and spinal canal: No prevertebral fluid or swelling. No visible canal hematoma. Disc levels: Interbody fusion of the cervical spine with posterior element hypertrophy. Upper chest: Patchy opacities at the bilateral lung apices with interlobular septal thickening. Other: None. IMPRESSION: 1. No acute intracranial abnormality. 2. No evidence of acute fracture or subluxation of the cervical spine. 3. Patchy opacities at the bilateral lung apices which with interlobular septal thickening, which may represent pulmonary edema or multifocal infection. Follow-up chest radiographs recommended to further assess. Electronically Signed   By: Davina Poke D.O.   On: 12/02/2022 11:47   CT Head Wo Contrast  Result Date: 12/02/2022 CLINICAL DATA:  Neck trauma (Age >= 65y); Head trauma, moderate-severe EXAM: CT HEAD WITHOUT CONTRAST CT CERVICAL SPINE WITHOUT CONTRAST TECHNIQUE: Multidetector CT imaging of the head and cervical spine was performed following the standard protocol without intravenous contrast. Multiplanar CT image reconstructions of the cervical spine were also generated. RADIATION DOSE REDUCTION: This exam was performed according to the departmental dose-optimization program which includes automated exposure control, adjustment of the mA and/or kV according to patient size and/or use  of iterative reconstruction technique. COMPARISON:  04/17/2020 FINDINGS: CT HEAD FINDINGS Brain: No evidence of acute infarction, hemorrhage, hydrocephalus, extra-axial collection or mass lesion/mass effect. Scattered low-density changes within the periventricular and subcortical white matter compatible with chronic microvascular ischemic change. Mild diffuse cerebral volume loss. Vascular: Atherosclerotic calcifications involving the large vessels of the skull base. No unexpected hyperdense vessel. Skull: Normal. Negative for fracture or focal lesion. Sinuses/Orbits: No acute finding. Other: None. CT CERVICAL SPINE FINDINGS Alignment: Facet joints are aligned without dislocation or traumatic listhesis. Dens and lateral masses are aligned. Mild grade 1 anterolisthesis with focal kyphosis at C7-T1 is unchanged. Skull base and vertebrae: Extensive interbody fusion throughout the cervical spine with hardware present at the C3-C5 levels and evidence of prior hardware removal at C6 and C7. Solid interbody fusion from C2 to C7. No evidence of fracture. No suspicious lytic or sclerotic bone lesion. Soft tissues and spinal canal: No prevertebral fluid or swelling. No visible canal hematoma. Disc levels: Interbody fusion of the cervical spine with posterior element hypertrophy. Upper chest: Patchy opacities at the bilateral lung apices with interlobular septal thickening. Other: None. IMPRESSION: 1. No acute intracranial abnormality. 2. No evidence of acute fracture or subluxation of  the cervical spine. 3. Patchy opacities at the bilateral lung apices which with interlobular septal thickening, which may represent pulmonary edema or multifocal infection. Follow-up chest radiographs recommended to further assess. Electronically Signed   By: Davina Poke D.O.   On: 12/02/2022 11:47   DG Hip Unilat W or Wo Pelvis 2-3 Views Right  Result Date: 12/02/2022 CLINICAL DATA:  Fall.  Right hip pain. EXAM: DG HIP (WITH OR  WITHOUT PELVIS) 2-3V RIGHT COMPARISON:  CT abdomen and pelvis 06/29/2022 FINDINGS: There is shortening of the right femoral neck consistent with an impacted subcapital fracture without significant displacement. There is no dislocation. Mild hip joint space narrowing and marginal spurring are noted bilaterally. Sequelae of previous lumbar fusion are partially visualized. IMPRESSION: Impacted right femoral neck fracture. Electronically Signed   By: Logan Bores M.D.   On: 12/02/2022 11:33   DG Ankle Complete Left  Result Date: 12/02/2022 CLINICAL DATA:  Status post fall EXAM: LEFT ANKLE COMPLETE - 3+ VIEW COMPARISON:  None Available. FINDINGS: There is ankle soft tissue swelling. No evidence of acute fracture. There is a well corticated bony fragment adjacent to the tip of the medial malleolus, consistent with chronic/prior injury. Plantar calcaneal spurring. Mild midfoot degenerative changes. IMPRESSION: Ankle soft tissue swelling without evidence of acute fracture. Evidence of old medial ankle injury. Electronically Signed   By: Maurine Simmering M.D.   On: 12/02/2022 11:33      Subjective: Patient seen and examined at bedside today.  Hemodynamically stable .sitting in the chair.  Denies any new complaints today.  Medically stable for discharge.  I called the son and discussed about the discharge plan.  Discharge Exam: Vitals:   12/05/22 0349 12/05/22 0736  BP: (!) 108/57 (!) 119/57  Pulse: 86 81  Resp: 16   Temp: 98.3 F (36.8 C) (!) 97.4 F (36.3 C)  SpO2: 100% 97%   Vitals:   12/04/22 0814 12/04/22 1945 12/05/22 0349 12/05/22 0736  BP: (!) 113/53 (!) 101/57 (!) 108/57 (!) 119/57  Pulse: 79 88 86 81  Resp:  18 16   Temp: 98.3 F (36.8 C) 98.1 F (36.7 C) 98.3 F (36.8 C) (!) 97.4 F (36.3 C)  TempSrc: Oral Oral Oral Oral  SpO2: 97% 98% 100% 97%  Weight:      Height:        General: Pt is alert, awake, not in acute distress Cardiovascular: RRR, S1/S2 +, no rubs, no  gallops Respiratory: CTA bilaterally, no wheezing, no rhonchi Abdominal: Soft, NT, ND, bowel sounds + Extremities: no edema, no cyanosis, surgical wound on the right hip    The results of significant diagnostics from this hospitalization (including imaging, microbiology, ancillary and laboratory) are listed below for reference.     Microbiology: Recent Results (from the past 240 hour(s))  Surgical pcr screen     Status: None   Collection Time: 12/03/22  1:36 AM   Specimen: Nasal Mucosa; Nasal Swab  Result Value Ref Range Status   MRSA, PCR NEGATIVE NEGATIVE Final   Staphylococcus aureus NEGATIVE NEGATIVE Final    Comment: (NOTE) The Xpert SA Assay (FDA approved for NASAL specimens in patients 77 years of age and older), is one component of a comprehensive surveillance program. It is not intended to diagnose infection nor to guide or monitor treatment. Performed at [facility], [place] [address], [place], [place] [address]      Labs: BNP (last 3 results) No results for input(s): "BNP" in the last  8760 hours. Basic Metabolic Panel: Recent Labs  Lab 12/02/22 1230 12/03/22 0347 12/04/22 0145 12/05/22 0224  NA 131* 138 135 132*  K 4.1 4.5 4.2 4.1  CL 102 103 101 101  CO2 '22 27 23 '$ 21*  GLUCOSE 115* 108* 107* 115*  BUN 10 5* 13 17  CREATININE 0.58 0.70 1.03* 1.04*  CALCIUM 8.6* 9.3 8.3* 8.3*   Liver Function Tests: Recent Labs  Lab 12/02/22 1801  AST 20  ALT 16  ALKPHOS 59  BILITOT 0.8  PROT 6.4*  ALBUMIN 3.4*   No results for input(s): "LIPASE", "AMYLASE" in the last 168 hours. No results for input(s): "AMMONIA" in the last 168 hours. CBC: Recent Labs  Lab 12/02/22 1230 12/03/22 0347 12/04/22 0145 12/05/22 0224  WBC 12.9* 5.8 11.5* 8.4  HGB 12.5 12.6 9.1* 8.2*  HCT 37.3 37.2 27.7* 25.0*  MCV 91.0 88.8 92.0 92.3  PLT 160 168 156 130*   Cardiac Enzymes: No results for input(s): "CKTOTAL", "CKMB", "CKMBINDEX", "TROPONINI" in the last 168  hours. BNP: Invalid input(s): "POCBNP" CBG: No results for input(s): "GLUCAP" in the last 168 hours. D-Dimer No results for input(s): "DDIMER" in the last 72 hours. Hgb A1c No results for input(s): "HGBA1C" in the last 72 hours. Lipid Profile No results for input(s): "CHOL", "HDL", "LDLCALC", "TRIG", "CHOLHDL", "LDLDIRECT" in the last 72 hours. Thyroid function studies No results for input(s): "TSH", "T4TOTAL", "T3FREE", "THYROIDAB" in the last 72 hours.  Invalid input(s): "FREET3" Anemia work up Recent Labs    12/05/22 0752  TIBC 157*  IRON 12*   Urinalysis    Component Value Date/Time   COLORURINE YELLOW 12/11/2013 [place] 12/11/2013 1318   LABSPEC 1.028 12/11/2013 1318   PHURINE 5.0 12/11/2013 1318   GLUCOSEU NEGATIVE 12/11/2013 1318   HGBUR NEGATIVE 12/11/2013 1318   BILIRUBINUR SMALL (A) 12/11/2013 1318   KETONESUR NEGATIVE 12/11/2013 1318   PROTEINUR NEGATIVE 12/11/2013 1318   UROBILINOGEN 1.0 12/11/2013 1318   NITRITE NEGATIVE 12/11/2013 1318   LEUKOCYTESUR NEGATIVE 12/11/2013 1318   Sepsis Labs Recent Labs  Lab 12/02/22 1230 12/03/22 0347 12/04/22 0145 12/05/22 0224  WBC 12.9* 5.8 11.5* 8.4   Microbiology Recent Results (from the past 240 hour(s))  Surgical pcr screen     Status: None   Collection Time: 12/03/22  1:36 AM   Specimen: Nasal Mucosa; Nasal Swab  Result Value Ref Range Status   MRSA, PCR NEGATIVE NEGATIVE Final   Staphylococcus aureus NEGATIVE NEGATIVE Final    Comment: (NOTE) The Xpert SA Assay (FDA approved for NASAL specimens in patients 62 years of age and older), is one component of a comprehensive surveillance program. It is not intended to diagnose infection nor to guide or monitor treatment. Performed at [facility], [place] [address] [place], [place] [address]     Please note: You were cared for by a hospitalist during your hospital stay. Once you are discharged, your primary care physician will  handle any further medical issues. Please note that NO REFILLS for any discharge medications will be authorized once you are discharged, as it is imperative that you return to your primary care physician (or establish a relationship with a primary care physician if you do not have one) for your post hospital discharge needs so that they can reassess your need for medications and monitor your lab values.    Time coordinating discharge: 40 minutes  SIGNED:   Shelly Coss, MD  [facility] 12/05/2022, 10:51 AM  Pager [phone]  If 7PM-7AM, please contact night-coverage [email or web address] Password [email or web address]

## 2022-12-06 ENCOUNTER — Encounter (HOSPITAL_COMMUNITY): Payer: Self-pay | Admitting: Orthopedic Surgery

## 2023-05-22 ENCOUNTER — Other Ambulatory Visit: Payer: Self-pay | Admitting: Gastroenterology

## 2023-05-22 DIAGNOSIS — R1011 Right upper quadrant pain: Secondary | ICD-10-CM

## 2023-05-24 ENCOUNTER — Ambulatory Visit (HOSPITAL_COMMUNITY)
Admission: RE | Admit: 2023-05-24 | Discharge: 2023-05-24 | Disposition: A | Discharge status: Home / Self Care | Payer: Medicare Other | Source: Ambulatory Visit | Attending: Gastroenterology | Admitting: Gastroenterology

## 2023-05-24 DIAGNOSIS — R1011 Right upper quadrant pain: Secondary | ICD-10-CM | POA: Insufficient documentation

## 2023-07-05 ENCOUNTER — Other Ambulatory Visit: Payer: Self-pay | Admitting: Urology

## 2023-07-07 NOTE — Progress Notes (Signed)
Sent message, via epic in basket, requesting orders in epic from surgeon.  

## 2023-07-13 NOTE — Patient Instructions (Addendum)
SURGICAL WAITING ROOM VISITATION Patients having surgery or a procedure may have no more than 2 support people in the waiting area - these visitors may rotate.    Children under the age of 79 must have an adult with them who is not the patient.  If the patient needs to stay at the hospital during part of their recovery, the visitor guidelines for inpatient rooms apply. Pre-op nurse will coordinate an appropriate time for 1 support person to accompany patient in pre-op.  This support person may not rotate.    Please refer to the [facility] website for the visitor guidelines for Inpatients (after your surgery is over and you are in a regular room).       Your procedure is scheduled on: 07-28-23   Report to [facility] Main Entrance    Report to admitting at 10:15 AM   Call this number if you have problems the morning of surgery [phone]   Do not eat food or drink liquids:After Midnight.           If you have questions, please contact your surgeon's office.   FOLLOW ANY ADDITIONAL PRE OP INSTRUCTIONS YOU RECEIVED FROM YOUR SURGEON'S OFFICE!!!     Oral Hygiene is also important to reduce your risk of infection.                                    Remember - BRUSH YOUR TEETH THE MORNING OF SURGERY WITH YOUR REGULAR TOOTHPASTE   Do NOT smoke after Midnight   Take these medicines the morning of surgery with A SIP OF WATER:   Levothyroxine  Singulair  Oxcarbazepine (Treleptel)  Stop all vitamins and herbal supplements 7 days before surgery                              You may not have any metal on your body including hair pins, jewelry, and body piercing             Do not wear make-up, lotions, powders, perfumes or deodorant  Do not wear nail polish including gel and S&S, artificial/acrylic nails, or any other type of covering on natural nails including finger and toenails. If you have artificial nails, gel coating, etc. that needs to be removed by a nail salon please  have this removed prior to surgery or surgery may need to be canceled/ delayed if the surgeon/ anesthesia feels like they are unable to be safely monitored.   Do not shave  48 hours prior to surgery.      Do not bring valuables to the hospital. [place] IS NOT RESPONSIBLE   FOR VALUABLES.   Contacts, dentures or bridgework may not be worn into surgery.  DO NOT BRING YOUR HOME MEDICATIONS TO THE HOSPITAL. PHARMACY WILL DISPENSE MEDICATIONS LISTED ON YOUR MEDICATION LIST TO YOU DURING YOUR ADMISSION IN THE HOSPITAL!    Patients discharged on the day of surgery will not be allowed to drive home.  Someone NEEDS to stay with you for the first 24 hours after anesthesia.               Please read over the following fact sheets you were given: IF YOU HAVE QUESTIONS ABOUT YOUR PRE-OP INSTRUCTIONS PLEASE CALL [phone] [place]  If you received a COVID test during your pre-op visit  it is requested that  you wear a mask when out in public, stay away from anyone that may not be feeling well and notify your surgeon if you develop symptoms. If you test positive for Covid or have been in contact with anyone that has tested positive in the last 10 days please notify you surgeon.  [facility] - Preparing for Surgery Before surgery, you can play an important role.  Because skin is not sterile, your skin needs to be as free of germs as possible.  You can reduce the number of germs on your skin by washing with CHG (chlorahexidine gluconate) soap before surgery.  CHG is an antiseptic cleaner which kills germs and bonds with the skin to continue killing germs even after washing. Please DO NOT use if you have an allergy to CHG or antibacterial soaps.  If your skin becomes reddened/irritated stop using the CHG and inform your nurse when you arrive at Short Stay. Do not shave (including legs and underarms) for at least 48 hours prior to the first CHG shower.  You may shave your face/neck.  Please follow these  instructions carefully:  1.  Shower with CHG Soap the night before surgery and the  morning of surgery.  2.  If you choose to wash your hair, wash your hair first as usual with your normal  shampoo.  3.  After you shampoo, rinse your hair and body thoroughly to remove the shampoo.                             4.  Use CHG as you would any other liquid soap.  You can apply chg directly to the skin and wash.  Gently with a scrungie or clean washcloth.  5.  Apply the CHG Soap to your body ONLY FROM THE NECK DOWN.   Do   not use on face/ open                           Wound or open sores. Avoid contact with eyes, ears mouth and   genitals (private parts).                       Wash face,  Genitals (private parts) with your normal soap.             6.  Wash thoroughly, paying special attention to the area where your    surgery  will be performed.  7.  Thoroughly rinse your body with warm water from the neck down.  8.  DO NOT shower/wash with your normal soap after using and rinsing off the CHG Soap.                9.  Pat yourself dry with a clean towel.            10.  Wear clean pajamas.            11.  Place clean sheets on your bed the night of your first shower and do not  sleep with pets. Day of Surgery : Do not apply any lotions/deodorants the morning of surgery.  Please wear clean clothes to the hospital/surgery center.  FAILURE TO FOLLOW THESE INSTRUCTIONS MAY RESULT IN THE CANCELLATION OF YOUR SURGERY  PATIENT SIGNATURE_________________________________  NURSE SIGNATURE__________________________________  ________________________________________________________________________

## 2023-07-13 NOTE — Progress Notes (Addendum)
COVID Vaccine Completed:  Yes  Date of COVID positive in last 41 days:No  PCP - Benedetto Goad, MD Cardiologist - Lance Muss, MD Neurologist - Stephanie Acre, MD   Chest x-ray - 12-02-22 Epic EKG - 07-14-23 Epic Stress Test - 09-25-16 Epic ECHO - 09-25-16 Epic Cardiac Cath - 09-26-16 Epic Pacemaker/ICD device last checked: Spinal Cord Stimulator: Cardiac Telemetry - 12-05-19 Epic  Bowel Prep - N/A  Sleep Study - N/A CPAP -   Fasting Blood Sugar - N/A Checks Blood Sugar _____ times a day  Last dose of GLP1 agonist-  N/A GLP1 instructions:  N/A   Last dose of SGLT-2 inhibitors-  N/A SGLT-2 instructions: N/A  Blood Thinner Instructions: N/A Aspirin Instructions: Last Dose:  Activity level:  Can go up a flight of stairs and perform activities of daily living without stopping and without symptoms of chest pain or shortness of breath.  Anesthesia review:  Hx of chest pain evaluated by cardiology.  Nonobstructive CAD.  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  Hx of syncope and collapse, no recent episodes, managed by Trileptal   Patient denies shortness of breath, fever, cough and chest pain at PAT appointment  Patient verbalized understanding of instructions that were given to them at the PAT appointment. Patient was also instructed that they will need to review over the PAT instructions again at home before surgery.

## 2023-07-14 ENCOUNTER — Encounter (HOSPITAL_COMMUNITY): Payer: Self-pay

## 2023-07-14 ENCOUNTER — Encounter (HOSPITAL_COMMUNITY)
Admission: RE | Admit: 2023-07-14 | Discharge: 2023-07-14 | Disposition: A | Discharge status: Home / Self Care | Payer: Medicare Other | Source: Ambulatory Visit | Attending: Urology | Admitting: Urology

## 2023-07-14 ENCOUNTER — Other Ambulatory Visit: Payer: Self-pay

## 2023-07-14 VITALS — BP 143/66 | HR 58 | Temp 98.7°F | Resp 16 | Ht 67.0 in | Wt 136.8 lb

## 2023-07-14 DIAGNOSIS — Z01818 Encounter for other preprocedural examination: Secondary | ICD-10-CM | POA: Insufficient documentation

## 2023-07-14 DIAGNOSIS — D649 Anemia, unspecified: Secondary | ICD-10-CM | POA: Diagnosis not present

## 2023-07-14 DIAGNOSIS — I10 Essential (primary) hypertension: Secondary | ICD-10-CM | POA: Diagnosis not present

## 2023-07-14 HISTORY — DX: Gastro-esophageal reflux disease without esophagitis: K21.9

## 2023-07-14 LAB — CBC
HCT: 38.8 % (ref 36.0–46.0)
Hemoglobin: 12.6 g/dL (ref 12.0–15.0)
MCH: 29.8 pg (ref 26.0–34.0)
MCHC: 32.5 g/dL (ref 30.0–36.0)
MCV: 91.7 fL (ref 80.0–100.0)
Platelets: 188 10*3/uL (ref 150–400)
RBC: 4.23 MIL/uL (ref 3.87–5.11)
RDW: 11.9 % (ref 11.5–15.5)
WBC: 5 10*3/uL (ref 4.0–10.5)
nRBC: 0 % (ref 0.0–0.2)

## 2023-07-14 LAB — BASIC METABOLIC PANEL
Anion gap: 5 (ref 5–15)
BUN: 8 mg/dL (ref 8–23)
CO2: 26 mmol/L (ref 22–32)
Calcium: 9.2 mg/dL (ref 8.9–10.3)
Chloride: 103 mmol/L (ref 98–111)
Creatinine, Ser: 0.64 mg/dL (ref 0.44–1.00)
GFR, Estimated: >60 mL/min (ref >60)
Glucose, Bld: 91 mg/dL (ref 70–99)
Potassium: 3.9 mmol/L (ref 3.5–5.1)
Sodium: 134 mmol/L — ABNORMAL LOW (ref 135–145)

## 2023-07-28 ENCOUNTER — Encounter (HOSPITAL_COMMUNITY)
Admission: RE | Disposition: A | Discharge status: Home / Self Care | Payer: Self-pay | Source: Ambulatory Visit | Attending: Urology

## 2023-07-28 ENCOUNTER — Ambulatory Visit (HOSPITAL_COMMUNITY): Payer: Medicare Other

## 2023-07-28 ENCOUNTER — Encounter (HOSPITAL_COMMUNITY): Payer: Self-pay | Admitting: Urology

## 2023-07-28 ENCOUNTER — Ambulatory Visit (HOSPITAL_BASED_OUTPATIENT_CLINIC_OR_DEPARTMENT_OTHER): Payer: Medicare Other | Admitting: Anesthesiology

## 2023-07-28 ENCOUNTER — Ambulatory Visit (HOSPITAL_COMMUNITY): Payer: Medicare Other | Admitting: Physician Assistant

## 2023-07-28 ENCOUNTER — Other Ambulatory Visit: Payer: Self-pay

## 2023-07-28 ENCOUNTER — Ambulatory Visit (HOSPITAL_COMMUNITY)
Admission: RE | Admit: 2023-07-28 | Discharge: 2023-07-28 | Disposition: A | Discharge status: Home / Self Care | Payer: Medicare Other | Source: Ambulatory Visit | Attending: Urology | Admitting: Urology

## 2023-07-28 DIAGNOSIS — N3030 Trigonitis without hematuria: Secondary | ICD-10-CM | POA: Diagnosis not present

## 2023-07-28 DIAGNOSIS — E871 Hypo-osmolality and hyponatremia: Secondary | ICD-10-CM | POA: Diagnosis not present

## 2023-07-28 DIAGNOSIS — Z79899 Other long term (current) drug therapy: Secondary | ICD-10-CM | POA: Insufficient documentation

## 2023-07-28 DIAGNOSIS — C679 Malignant neoplasm of bladder, unspecified: Secondary | ICD-10-CM | POA: Insufficient documentation

## 2023-07-28 DIAGNOSIS — E039 Hypothyroidism, unspecified: Secondary | ICD-10-CM | POA: Diagnosis not present

## 2023-07-28 DIAGNOSIS — I10 Essential (primary) hypertension: Secondary | ICD-10-CM | POA: Insufficient documentation

## 2023-07-28 HISTORY — PX: CYSTOSCOPY W/ RETROGRADES: SHX1426

## 2023-07-28 HISTORY — PX: TRANSURETHRAL RESECTION OF BLADDER TUMOR: SHX2575

## 2023-07-28 SURGERY — TURBT (TRANSURETHRAL RESECTION OF BLADDER TUMOR)
Anesthesia: General

## 2023-07-28 MED ORDER — ONDANSETRON HCL 4 MG/2ML IJ SOLN
INTRAMUSCULAR | Status: DC | PRN
  Administered 2023-07-28: 4 mg via INTRAVENOUS

## 2023-07-28 MED ORDER — HYDROMORPHONE HCL 1 MG/ML IJ SOLN
INTRAMUSCULAR | Status: AC
  Filled 2023-07-28: qty 1

## 2023-07-28 MED ORDER — AMISULPRIDE (ANTIEMETIC) 5 MG/2ML IV SOLN: 10.0000 mg | Freq: Once | INTRAVENOUS | Status: DC | PRN

## 2023-07-28 MED ORDER — EPHEDRINE 5 MG/ML INJ
INTRAVENOUS | Status: AC
  Filled 2023-07-28: qty 5

## 2023-07-28 MED ORDER — ONDANSETRON HCL 4 MG/2ML IJ SOLN
INTRAMUSCULAR | Status: AC
  Filled 2023-07-28: qty 2

## 2023-07-28 MED ORDER — IOHEXOL 300 MG/ML  SOLN
INTRAMUSCULAR | Status: DC | PRN
  Administered 2023-07-28: 10 mL

## 2023-07-28 MED ORDER — EPHEDRINE SULFATE (PRESSORS) 50 MG/ML IJ SOLN
INTRAMUSCULAR | Status: DC | PRN
  Administered 2023-07-28: 7.5 mg via INTRAVENOUS

## 2023-07-28 MED ORDER — TRAMADOL HCL 50 MG PO TABS: 50.0000 mg | ORAL_TABLET | Freq: Four times a day (QID) | ORAL | 0 refills | Status: DC | PRN

## 2023-07-28 MED ORDER — LIDOCAINE HCL (PF) 2 % IJ SOLN
INTRAMUSCULAR | Status: AC
  Filled 2023-07-28: qty 5

## 2023-07-28 MED ORDER — HYDROMORPHONE HCL 1 MG/ML IJ SOLN: 0.2500 mg | INTRAMUSCULAR | Status: DC | PRN

## 2023-07-28 MED ORDER — LIDOCAINE HCL (CARDIAC) PF 100 MG/5ML IV SOSY
PREFILLED_SYRINGE | INTRAVENOUS | Status: DC | PRN
  Administered 2023-07-28: 60 mg via INTRAVENOUS

## 2023-07-28 MED ORDER — CEFAZOLIN SODIUM-DEXTROSE 2-4 GM/100ML-% IV SOLN
2.0000 g | INTRAVENOUS | Status: AC
  Administered 2023-07-28: 2 g via INTRAVENOUS
  Filled 2023-07-28: qty 100

## 2023-07-28 MED ORDER — ORAL CARE MOUTH RINSE: 15.0000 mL | Freq: Once | OROMUCOSAL | Status: AC

## 2023-07-28 MED ORDER — PROPOFOL 10 MG/ML IV BOLUS
INTRAVENOUS | Status: AC
  Filled 2023-07-28: qty 20

## 2023-07-28 MED ORDER — FENTANYL CITRATE (PF) 100 MCG/2ML IJ SOLN
INTRAMUSCULAR | Status: DC | PRN
  Administered 2023-07-28: 50 ug via INTRAVENOUS

## 2023-07-28 MED ORDER — DEXAMETHASONE SODIUM PHOSPHATE 4 MG/ML IJ SOLN
INTRAMUSCULAR | Status: DC | PRN
  Administered 2023-07-28: 8 mg via INTRAVENOUS

## 2023-07-28 MED ORDER — LACTATED RINGERS IV SOLN: INTRAVENOUS | Status: DC

## 2023-07-28 MED ORDER — FENTANYL CITRATE (PF) 100 MCG/2ML IJ SOLN
INTRAMUSCULAR | Status: AC
  Filled 2023-07-28: qty 2

## 2023-07-28 MED ORDER — SODIUM CHLORIDE 0.9 % IR SOLN
Status: DC | PRN
  Administered 2023-07-28: 6000 mL

## 2023-07-28 MED ORDER — PROPOFOL 10 MG/ML IV BOLUS
INTRAVENOUS | Status: DC | PRN
  Administered 2023-07-28: 150 mg via INTRAVENOUS

## 2023-07-28 MED ORDER — DEXAMETHASONE SODIUM PHOSPHATE 10 MG/ML IJ SOLN
INTRAMUSCULAR | Status: AC
  Filled 2023-07-28: qty 1

## 2023-07-28 MED ORDER — CHLORHEXIDINE GLUCONATE 0.12 % MT SOLN
15.0000 mL | Freq: Once | OROMUCOSAL | Status: AC
  Administered 2023-07-28: 15 mL via OROMUCOSAL

## 2023-07-28 MED ORDER — PROMETHAZINE HCL 25 MG/ML IJ SOLN: 6.2500 mg | INTRAMUSCULAR | Status: DC | PRN

## 2023-07-28 SURGICAL SUPPLY — 24 items
BAG COUNTER SPONGE SURGICOUNT (BAG) IMPLANT
BAG DRN RND TRDRP ANRFLXCHMBR (UROLOGICAL SUPPLIES)
BAG SPNG CNTER NS LX DISP (BAG)
BAG URINE DRAIN 2000ML AR STRL (UROLOGICAL SUPPLIES) IMPLANT
BAG URO CATCHER STRL LF (MISCELLANEOUS) ×2 IMPLANT
CATH URETL OPEN END 6FR 70 (CATHETERS) IMPLANT
CLOTH BEACON ORANGE TIMEOUT ST (SAFETY) ×2 IMPLANT
DRAPE FOOT SWITCH (DRAPES) ×2 IMPLANT
ELECT REM PT RETURN 15FT ADLT (MISCELLANEOUS) ×2 IMPLANT
EVACUATOR MICROVAS BLADDER (UROLOGICAL SUPPLIES) IMPLANT
GLOVE SURG LX STRL 7.5 STRW (GLOVE) ×2 IMPLANT
GOWN STRL REUS W/ TWL XL LVL3 (GOWN DISPOSABLE) ×2 IMPLANT
GOWN STRL REUS W/TWL XL LVL3 (GOWN DISPOSABLE) ×2
GUIDEWIRE STR DUAL SENSOR (WIRE) ×2 IMPLANT
KIT TURNOVER KIT A (KITS) IMPLANT
LOOP CUT BIPOLAR 24F LRG (ELECTROSURGICAL) IMPLANT
MANIFOLD NEPTUNE II (INSTRUMENTS) ×2 IMPLANT
NS IRRIG 1000ML POUR BTL (IV SOLUTION) IMPLANT
PACK CYSTO (CUSTOM PROCEDURE TRAY) ×2 IMPLANT
PAD TELFA 2X3 NADH STRL (GAUZE/BANDAGES/DRESSINGS) IMPLANT
SYR TOOMEY IRRIG 70ML (MISCELLANEOUS)
SYRINGE TOOMEY IRRIG 70ML (MISCELLANEOUS) IMPLANT
TUBING CONNECTING 10 (TUBING) ×2 IMPLANT
TUBING UROLOGY SET (TUBING) ×2 IMPLANT

## 2023-07-28 NOTE — H&P (Signed)
Sarah [name] is an 82 y.o. female.    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 No hydro / sotnes on CT 2023.  Today "Sarah [name]" is seen to proceed with cysto, retrogardes, TURBT for recurrent bladder cancer. No interval fevers. Most recent UA without infecitous parameters.   Past Medical History:  Diagnosis Date   Anemia    Cancer (HCC)    Bladder 9/23   Degenerative arthritis    GERD (gastroesophageal reflux disease)    Headache(784.0)    sinus   Hypertension    Hypothyroidism    Obesity    Syncope, near 1 year ago    Past Surgical History:  Procedure Laterality Date   ABDOMINAL HYSTERECTOMY     partial   arthroscopic knee surgery  July 03, 2013   CARDIAC CATHETERIZATION N/A 09/26/2016   Procedure: Left Heart Cath and Coronary Angiography;  Surgeon: Corky Crafts, MD;  Location: [facility] INVASIVE CV LAB;  Service: Cardiovascular;  Laterality: N/A;   CARPAL TUNNEL RELEASE Bilateral    CATARACT EXTRACTION, BILATERAL     CERVICAL SPINE SURGERY     COLONOSCOPY WITH PROPOFOL N/A 12/20/2019   Procedure: COLONOSCOPY WITH PROPOFOL;  Surgeon: Jeani Hawking, MD;  Location: [facility] ENDOSCOPY;  Service: Endoscopy;  Laterality: N/A;   cyst removed from eyebrow  4-5 yrs ago   CYSTOSCOPY W/ RETROGRADES Bilateral 07/26/2022   Procedure: CYSTOSCOPY WITH RETROGRADE PYELOGRAM;  Surgeon: Belva Agee, MD;  Location: [facility] ORS;  Service: Urology;  Laterality: Bilateral;   HEMOSTASIS CLIP PLACEMENT  12/20/2019   Procedure: HEMOSTASIS CLIP PLACEMENT;  Surgeon: Jeani Hawking, MD;  Location: [facility] ENDOSCOPY;  Service: Endoscopy;;   LUMBAR SPINE SURGERY     POLYPECTOMY   12/20/2019   Procedure: POLYPECTOMY;  Surgeon: Jeani Hawking, MD;  Location: [facility] ENDOSCOPY;  Service: Endoscopy;;   SUBMUCOSAL LIFTING INJECTION  12/20/2019   Procedure: SUBMUCOSAL LIFTING INJECTION;  Surgeon: Jeani Hawking, MD;  Location: [facility] ENDOSCOPY;  Service: Endoscopy;;   TONSILLECTOMY  age 50 or 68   TOTAL HIP ARTHROPLASTY Right 12/03/2022   Procedure: TOTAL HIP ARTHROPLASTY ANTERIOR APPROACH;  Surgeon: Samson Frederic, MD;  Location: [facility] OR;  Service: Orthopedics;  Laterality: Right;   TOTAL KNEE ARTHROPLASTY Right 12/18/2013   Procedure: RIGHT TOTAL KNEE ARTHROPLASTY;  Surgeon: Jacki Cones, MD;  Location: [facility] ORS;  Service: Orthopedics;  Laterality: Right;   TRANSURETHRAL RESECTION OF BLADDER TUMOR N/A 07/26/2022   Procedure: TRANSURETHRAL RESECTION OF BLADDER TUMOR (TURBT);  Surgeon: Belva Agee, MD;  Location: [facility] ORS;  Service: Urology;  Laterality: N/A;    Family History  Problem Relation Age of Onset   Alzheimer's disease Sister    Cancer - Lung Sister    Heart disease Brother    Heart disease Brother    Heart disease Brother    Cancer Brother        Bladder cancer   Social History:  reports that she has never smoked. She has never used smokeless tobacco. She reports that she does not drink alcohol and does not use drugs.  Allergies:  Allergies  Allergen Reactions   Atorvastatin Other (See Comments)    Fatigue, leg cramps   Doxycycline  Nausea And Vomiting   Keppra [Levetiracetam]     jitteriness   Other     Band-Aid cause patient to swell   Oxycodone-Acetaminophen Nausea And Vomiting   Tape Swelling and Other (See Comments)    redness   Penicillins Hives    Tolerated Ancef without difficulty on 26 Jul 2022   Voltaren [Diclofenac Sodium] Hives and Rash    No medications prior to admission.    No results found for this or any previous visit (from the past 48 hour(s)). No results found.  Review of Systems  Constitutional:  Negative for chills and fever.   All other systems reviewed and are negative.   There were no vitals taken for this visit. Physical Exam Vitals reviewed.  HENT:     Head: Normocephalic.  Eyes:     Pupils: Pupils are equal, round, and reactive to light.  Cardiovascular:     Rate and Rhythm: Normal rate.  Pulmonary:     Effort: Pulmonary effort is normal.  Abdominal:     General: Abdomen is flat.  Genitourinary:    Comments: No CVAT at present Musculoskeletal:        General: Normal range of motion.     Cervical back: Normal range of motion.  Skin:    General: Skin is warm.  Neurological:     General: No focal deficit present.     Mental Status: She is alert.  Psychiatric:        Mood and Affect: Mood normal.      Assessment/Plan  Proceed as planned with cysto, retrogrades, TURBT. Risks, benefits, alternatives, expected peri-op course discussed in detail.   Loletta Parish., MD 07/28/2023, 5:43 AM

## 2023-07-28 NOTE — Discharge Instructions (Signed)
1 - You may have urinary urgency (bladder spasms) and bloody urine on / off with stent in place. This is normal. ° °2 - Call MD or go to ER for fever >102, severe pain / nausea / vomiting not relieved by medications, or acute change in medical status ° °

## 2023-07-28 NOTE — Anesthesia Procedure Notes (Signed)
Procedure Name: LMA Insertion Date/Time: 07/28/2023 1:00 PM  Performed by: Johnette Abraham, CRNAPre-anesthesia Checklist: Patient identified, Emergency Drugs available, Suction available and Patient being monitored Patient Re-evaluated:Patient Re-evaluated prior to induction Oxygen Delivery Method: Circle System Utilized Preoxygenation: Pre-oxygenation with 100% oxygen Induction Type: IV induction Ventilation: Mask ventilation without difficulty LMA: LMA inserted LMA Size: 4.0 Number of attempts: 1 Airway Equipment and Method: Bite block Placement Confirmation: positive ETCO2 Tube secured with: Tape Dental Injury: Teeth and Oropharynx as per pre-operative assessment

## 2023-07-28 NOTE — Transfer of Care (Signed)
Immediate Anesthesia Transfer of Care Note  Patient: Sarah [name]  Procedure(s) Performed: TRANSURETHRAL RESECTION OF BLADDER TUMOR (TURBT) CYSTOSCOPY WITH RETROGRADE PYELOGRAM (Bilateral)  Patient Location: PACU  Anesthesia Type:General  Level of Consciousness: awake, alert , oriented, and patient cooperative  Airway & Oxygen Therapy: Patient Spontanous Breathing and Patient connected to face mask oxygen  Post-op Assessment: Report given to RN, Post -op Vital signs reviewed and stable, and Patient moving all extremities  Post vital signs: Reviewed and stable  Last Vitals:  Vitals Value Taken Time  BP 126/64 07/28/23 1349  Temp    Pulse 63 07/28/23 1350  Resp 20 07/28/23 1350  SpO2 100 % 07/28/23 1350  Vitals shown include unfiled device data.  Last Pain:  Vitals:   07/28/23 1058  TempSrc:   PainSc: 0-No pain         Complications: No notable events documented.

## 2023-07-28 NOTE — Op Note (Unsigned)
NAME: Sarah [name], Sarah [name] MEDICAL RECORD NO: [id number] ACCOUNT NO: [id number] DATE OF BIRTH: 1941/11/11 FACILITY: [name] LOCATION: [facility] PHYSICIAN: Sarah Ache, MD  Operative Report   DATE OF PROCEDURE: 07/28/2023  PREOPERATIVE DIAGNOSIS:  Small volume, recurrent bladder cancer, bladder erythema.  PROCEDURE: 1.  Transurethral resection of bladder tumor, volume small. 2.  Bilateral retrograde pyelograms interpretation.  ESTIMATED BLOOD LOSS:  Nil.  COMPLICATIONS:  None.  SPECIMENS: 1.  Bladder tumor. 2.  Bladder erythema for permanent pathology.  FINDINGS: 1.  Small volume papillary tumor and what appeared to be old resection site on the right lateral bladder.  This was less than 2 cm. 2.  Patchy erythema appeared diffusely with mucosal edema concerning for possible carcinoma in situ versus inflammatory cystitis changes. 3.  Unremarkable bilateral retrograde pyelograms, lumbar spine, however noted.  INDICATIONS:  The patient is a pleasant 82 year old lady with history of high-grade superficial bladder cancer, status post resection and BCG induction last year.  He was found on surveillance cystoscopy to have a small volume recurrence of papillary  tumor near old resection site as well as new bladder erythema, which was concerning for possible carcinoma in situ.  Options were discussed and he wished to proceed with transurethral resection for diagnostic and therapeutic intent.  Informed consent was  obtained and placed in medical record.  PROCEDURE IN DETAIL:  The patient being Sarah [name] verified, procedure being cystoscopy, bilateral retrograde transurethral resection of bladder tumor was confirmed.  Procedure timeout was performed.  Intravenous antibiotics administered.  General  LMA anesthesia induced.  The patient was placed into a low lithotomy position.  Sterile field was created, prepped and draped the patient's vagina, introitus, and proximal thighs using iodine.   Cystourethroscopy was performed using 21-French rigid  cystoscope with offset lens.  Inspection of urinary bladder revealed no diverticula or calcifications.  There was patchy erythema with some mucosal edema that appeared to be more inflammatory cystitis changes, the carcinoma in situ is certainly possible,  there was a small volume papillary tumor near an old resection site on the right lateral wall.  This was well away from the right ureteral orifice.  Total volume was only about a centimeter.  Attention was directed at retrograde pyelograms.  The left  ureteral orifice was cannulated with 6-French end-hole catheter, and a left retrograde pyelogram was obtained.  Left retrograde pyelogram demonstrated a single left ureter, single system left kidney.  There was some tortuosity, but no obvious filling defects.  There is some lumbar spinal hardware noted, which did obscure the mid portion of the ureter somewhat.   Next, right retrograde pyelogram was obtained.  Right retrograde pyelogram demonstrated single right ureter, single system right kidney.  Similarly, there was some tortuosity without obvious filling defects with the mid ureter being partially obscured by lumbar spinal hardware.  Next cystoscope was exchanged  for the 26-French resectoscope sheath and using resectoscope loop, very careful resection was performed of the small area of papillary tumor on the right lateral wall taking exquisite care to avoid any obvious perforation, which did not occur, set aside  and labeled as bladder tumor, deep bites were not obtained given the patient's very thin habitus and advanced age as there was no obvious muscle invasion grossly and the specimen did appear to obtain muscle.  The edges of the incision sites were  fulgurated.  Next cold cup biopsy forceps were used to obtain representative samples of the areas of mucosal erythema.  These were set aside and  labeled as such.  The base of these areas were also  fulgurated.  Following this excellent hemostasis, no  evidence of perforation.  Bladder was empty per cystoscope.  Procedure was then terminated.  The patient tolerated the procedure well, no immediate periprocedural complications.  The patient was taken to postanesthesia care unit in stable condition.   Plan for discharge home.   Sarah [name]: 07/28/2023 3:09:39 pm T: 07/28/2023 7:46:00 pm  JOB: [id number]/ [id number]

## 2023-07-28 NOTE — Anesthesia Postprocedure Evaluation (Signed)
Anesthesia Post Note  Patient: Sarah [name]  Procedure(s) Performed: TRANSURETHRAL RESECTION OF BLADDER TUMOR (TURBT) CYSTOSCOPY WITH RETROGRADE PYELOGRAM (Bilateral)     Patient location during evaluation: PACU Anesthesia Type: General Level of consciousness: awake and alert Pain management: pain level controlled Vital Signs Assessment: post-procedure vital signs reviewed and stable Respiratory status: spontaneous breathing, nonlabored ventilation and respiratory function stable Cardiovascular status: blood pressure returned to baseline and stable Postop Assessment: no apparent nausea or vomiting Anesthetic complications: no   No notable events documented.  Last Vitals:  Vitals:   07/28/23 1429 07/28/23 1430  BP: (!) 158/75 (!) 158/75  Pulse: (!) 59 (!) 57  Resp:    Temp:    SpO2: 97% 100%    Last Pain:  Vitals:   07/28/23 1430  TempSrc:   PainSc: 0-No pain                 Lowella Curb

## 2023-07-28 NOTE — Anesthesia Preprocedure Evaluation (Signed)
Anesthesia Evaluation  Patient identified by MRN, date of birth, ID band Patient awake    Reviewed: Allergy & Precautions, NPO status , Patient's Chart, lab work & pertinent test results  Airway Mallampati: II  TM Distance: >3 FB Neck ROM: Full    Dental no notable dental hx.    Pulmonary neg pulmonary ROS   Pulmonary exam normal breath sounds clear to auscultation       Cardiovascular hypertension, Pt. on medications Normal cardiovascular exam Rhythm:Regular Rate:Normal     Neuro/Psych  Headaches  negative psych ROS   GI/Hepatic negative GI ROS, Neg liver ROS,,,  Endo/Other  Hypothyroidism    Renal/GU negative Renal ROS  negative genitourinary   Musculoskeletal  (+) Arthritis , Osteoarthritis,    Abdominal   Peds negative pediatric ROS (+)  Hematology negative hematology ROS (+)   Anesthesia Other Findings Bladder Tumor  Reproductive/Obstetrics negative OB ROS                              Anesthesia Physical Anesthesia Plan  ASA: 3  Anesthesia Plan: General   Post-op Pain Management:    Induction: Intravenous  PONV Risk Score and Plan: 3 and Ondansetron, Dexamethasone, Midazolam and Treatment may vary due to age or medical condition  Airway Management Planned: LMA  Additional Equipment:   Intra-op Plan:   Post-operative Plan: Extubation in OR  Informed Consent: I have reviewed the patients History and Physical, chart, labs and discussed the procedure including the risks, benefits and alternatives for the proposed anesthesia with the patient or authorized representative who has indicated his/her understanding and acceptance.     Dental advisory given  Plan Discussed with: CRNA and Surgeon  Anesthesia Plan Comments:          Anesthesia Quick Evaluation

## 2023-07-28 NOTE — Brief Op Note (Signed)
07/28/2023  3:03 PM  PATIENT:  Sarah [name]  82 y.o. female  PRE-OPERATIVE DIAGNOSIS:  RECURRENT BLADDER TUMOR  POST-OPERATIVE DIAGNOSIS:  RECURRENT BLADDER TUMOR  PROCEDURE:  Procedure(s) with comments: TRANSURETHRAL RESECTION OF BLADDER TUMOR (TURBT) (N/A) - 30 MINS CYSTOSCOPY WITH RETROGRADE PYELOGRAM (Bilateral)  SURGEON:  Surgeons and Role:    * Deaisha Welborn, Delbert Phenix., MD - Primary  PHYSICIAN ASSISTANT:   ASSISTANTS: none   ANESTHESIA:   general  EBL:  0 mL   BLOOD ADMINISTERED:none  DRAINS: none   LOCAL MEDICATIONS USED:  NONE  SPECIMEN:  Source of Specimen:  bladder tumor; bladder erythema  DISPOSITION OF SPECIMEN:  PATHOLOGY  COUNTS:  YES  TOURNIQUET:  * No tourniquets in log *  DICTATION: .Other Dictation: Dictation Number [id number]  PLAN OF CARE: Discharge to home after PACU  PATIENT DISPOSITION:  PACU - hemodynamically stable.   Delay start of Pharmacological VTE agent (>24hrs) due to surgical blood loss or risk of bleeding: yes

## 2023-07-29 ENCOUNTER — Encounter (HOSPITAL_COMMUNITY): Payer: Self-pay | Admitting: Urology

## 2023-07-31 LAB — SURGICAL PATHOLOGY

## 2023-08-14 NOTE — Progress Notes (Unsigned)
Patient: Sarah [name] Date of Birth: 1941/06/24  Reason for Visit: Follow up History from: Patient Primary Neurologist: Dr. Vickey [name]  ASSESSMENT AND PLAN 82 y.o. year old female   1.  Recurrent syncope -No further syncopal events since starting Trileptal -I reviewed recent labs -Continue Trileptal 150 mg twice a day -Call for any spells, otherwise follow-up in 1 year  HISTORY OF PRESENT ILLNESS: Today 08/14/23   Update 08/10/22 SS: Sarah [name] is here today for follow-up. Remains on Trileptal 150 mg twice daily. No more spells of syncope. Denies any side effects. Has been found to have bladder cancer. Had tumors removed 2 weeks ago, is going to undergo some form of chemotherapy. She feels good. Lives with her husband, he is blind. Labs from 07/20/22 show sodium level 135, creatinine 0.58, CBC was unremarkable.  Trileptal level was 4 in Feb 2022.  HISTORY  08/06/21 Sarah [name]: Sarah [name] is a 82 year old right-handed white female with a history of recurrent episodes of syncope.  The patient is on a very low-dose of Trileptal taking 150 milligrams twice daily.  She has not had any recurrence of symptoms since last seen.  The blood levels are quite low with the Trileptal but it seems to be effective for her.  The patient is being treated for presumed seizures as syncopal episodes on cardiac monitor have not shown any cardiac rhythm abnormalities.  The patient gets about 4 hours of sleep at night but this is adequate for her, she has always done this.  She has good energy level during the day.  She returns for an evaluation at this time.  She is operating a motor vehicle.  REVIEW OF SYSTEMS: Out of a complete 14 system review of symptoms, the patient complains only of the following symptoms, and all other reviewed systems are negative.  See HPI  ALLERGIES: Allergies  Allergen Reactions   Atorvastatin Other (See Comments)    Fatigue, leg cramps   Doxycycline Nausea And  Vomiting   Keppra [Levetiracetam]     jitteriness   Other     Band-Aid cause patient to swell   Oxycodone-Acetaminophen Nausea And Vomiting   Tape Swelling and Other (See Comments)    redness   Penicillins Hives    Tolerated Ancef without difficulty on 26 Jul 2022   Voltaren [Diclofenac Sodium] Hives and Rash    HOME MEDICATIONS: Outpatient Medications Prior to Visit  Medication Sig Dispense Refill   acetaminophen (TYLENOL) 500 MG tablet Take 1,000 mg by mouth every 6 (six) hours as needed for moderate pain.     Artificial Tear Solution (SOOTHE XP OP) Place 1 drop into both eyes daily as needed (dry eyes).     Calcium Carb-Cholecalciferol (CALCIUM 600 + D PO) Take 1 tablet by mouth daily.     cyanocobalamin (VITAMIN B12) 500 MCG tablet Take 500 mcg by mouth daily.     estradiol (ESTRACE) 1 MG tablet Take 0.5 mg by mouth daily with breakfast.      Ferrous Sulfate (IRON PO) Take 1 tablet by mouth daily.     fluticasone (FLONASE) 50 MCG/ACT nasal spray Place 1 spray into both nostrils as needed for allergies.      levothyroxine (SYNTHROID, LEVOTHROID) 75 MCG tablet Take 75 mcg by mouth every morning.      montelukast (SINGULAIR) 10 MG tablet Take 10 mg by mouth daily.     OXcarbazepine (TRILEPTAL) 150 MG tablet Take 1 tablet (150 mg total) by mouth 2 (  two) times daily. 180 tablet 3   traMADol (ULTRAM) 50 MG tablet Take 1 tablet (50 mg total) by mouth every 6 (six) hours as needed for moderate pain (post-operatively). 10 tablet 0   No facility-administered medications prior to visit.    PAST MEDICAL HISTORY: Past Medical History:  Diagnosis Date   Anemia    Cancer (HCC)    Bladder 9/23   Degenerative arthritis    GERD (gastroesophageal reflux disease)    Headache(784.0)    sinus   Hypertension    Hypothyroidism    Obesity    Syncope, near 1 year ago    PAST SURGICAL HISTORY: Past Surgical History:  Procedure Laterality Date   ABDOMINAL HYSTERECTOMY     partial    arthroscopic knee surgery  July 03, 2013   CARDIAC CATHETERIZATION N/A 09/26/2016   Procedure: Left Heart Cath and Coronary Angiography;  Surgeon: Corky Crafts, MD;  Location: [facility] INVASIVE CV LAB;  Service: Cardiovascular;  Laterality: N/A;   CARPAL TUNNEL RELEASE Bilateral    CATARACT EXTRACTION, BILATERAL     CERVICAL SPINE SURGERY     COLONOSCOPY WITH PROPOFOL N/A 12/20/2019   Procedure: COLONOSCOPY WITH PROPOFOL;  Surgeon: Jeani Hawking, MD;  Location: [facility] ENDOSCOPY;  Service: Endoscopy;  Laterality: N/A;   cyst removed from eyebrow  4-5 yrs ago   CYSTOSCOPY W/ RETROGRADES Bilateral 07/26/2022   Procedure: CYSTOSCOPY WITH RETROGRADE PYELOGRAM;  Surgeon: Belva Agee, MD;  Location: [facility] ORS;  Service: Urology;  Laterality: Bilateral;   CYSTOSCOPY W/ RETROGRADES Bilateral 07/28/2023   Procedure: CYSTOSCOPY WITH RETROGRADE PYELOGRAM;  Surgeon: Loletta Parish., MD;  Location: [facility];  Service: Urology;  Laterality: Bilateral;   HEMOSTASIS CLIP PLACEMENT  12/20/2019   Procedure: HEMOSTASIS CLIP PLACEMENT;  Surgeon: Jeani Hawking, MD;  Location: [facility];  Service: Endoscopy;;   LUMBAR SPINE SURGERY     POLYPECTOMY  12/20/2019   Procedure: POLYPECTOMY;  Surgeon: Jeani Hawking, MD;  Location: [facility];  Service: Endoscopy;;   SUBMUCOSAL LIFTING INJECTION  12/20/2019   Procedure: SUBMUCOSAL LIFTING INJECTION;  Surgeon: Jeani Hawking, MD;  Location: [facility];  Service: Endoscopy;;   TONSILLECTOMY  age 71 or 62   TOTAL HIP ARTHROPLASTY Right 12/03/2022   Procedure: TOTAL HIP ARTHROPLASTY ANTERIOR APPROACH;  Surgeon: Samson Frederic, MD;  Location: [facility];  Service: Orthopedics;  Laterality: Right;   TOTAL KNEE ARTHROPLASTY Right 12/18/2013   Procedure: RIGHT TOTAL KNEE ARTHROPLASTY;  Surgeon: Jacki Cones, MD;  Location: [facility];  Service: Orthopedics;  Laterality: Right;   TRANSURETHRAL RESECTION OF BLADDER TUMOR N/A 07/26/2022   Procedure: TRANSURETHRAL RESECTION OF BLADDER  TUMOR (TURBT);  Surgeon: Belva Agee, MD;  Location: [facility];  Service: Urology;  Laterality: N/A;   TRANSURETHRAL RESECTION OF BLADDER TUMOR N/A 07/28/2023   Procedure: TRANSURETHRAL RESECTION OF BLADDER TUMOR (TURBT);  Surgeon: Loletta Parish., MD;  Location: [facility];  Service: Urology;  Laterality: N/A;  30 MINS    FAMILY HISTORY: Family History  Problem Relation Age of Onset   Alzheimer's disease Sister    Cancer - Lung Sister    Heart disease Brother    Heart disease Brother    Heart disease Brother    Cancer Brother        Bladder cancer    SOCIAL HISTORY: Social History   Socioeconomic History   Marital status: Married    Spouse name: Not on file   Number of children: 3   Years of  education: 12   Highest education level: Not on file  Occupational History   Not on file  Tobacco Use   Smoking status: Never    Passive exposure: Past   Smokeless tobacco: Never  Vaping Use   Vaping status: Never Used  Substance and Sexual Activity   Alcohol use: No   Drug use: No   Sexual activity: Not on file  Other Topics Concern   Not on file  Social History Narrative   Not on file   Social Determinants of Health   Financial Resource Strain: Not on file  Food Insecurity: No Food Insecurity (12/02/2022)   Hunger Vital Sign    Worried About Running Out of Food in the Last Year: Never true    Ran Out of Food in the Last Year: Never true  Transportation Needs: No Transportation Needs (12/02/2022)   PRAPARE - Administrator, Civil Service (Medical): No    Lack of Transportation (Non-Medical): No  Physical Activity: Not on file  Stress: Not on file  Social Connections: Not on file  Intimate Partner Violence: Not At Risk (12/02/2022)   Humiliation, Afraid, Rape, and Kick questionnaire    Fear of Current or Ex-Partner: No    Emotionally Abused: No    Physically Abused: No    Sexually Abused: No    PHYSICAL EXAM  There were no vitals filed for this  visit.  There is no height or weight on file to calculate BMI.  Generalized: Well developed, in no acute distress  Neurological examination  Mentation: Alert oriented to time, place, history taking. Follows all commands speech and language fluent Cranial nerve II-XII: Pupils were equal round reactive to light. Extraocular movements were full, visual field were full on confrontational test. Facial sensation and strength were normal. Head turning and shoulder shrug  were normal and symmetric. Motor: The motor testing reveals 5 over 5 strength of all 4 extremities. Good symmetric motor tone is noted throughout.  Sensory: Sensory testing is intact to soft touch on all 4 extremities. No evidence of extinction is noted.  Coordination: Cerebellar testing reveals good finger-nose-finger and heel-to-shin bilaterally.  Gait and station: Gait is slightly wide-based, but steady and independent Reflexes: Deep tendon reflexes are symmetric and normal bilaterally.   DIAGNOSTIC DATA (LABS, IMAGING, TESTING) - I reviewed patient records, labs, notes, testing and imaging myself where available.  Lab Results  Component Value Date   WBC 5.0 07/14/2023   HGB 12.6 07/14/2023   HCT 38.8 07/14/2023   MCV 91.7 07/14/2023   PLT 188 07/14/2023      Component Value Date/Time   NA 134 (L) 07/14/2023 1328   NA 140 01/06/2021 1447   K 3.9 07/14/2023 1328   CL 103 07/14/2023 1328   CO2 26 07/14/2023 1328   GLUCOSE 91 07/14/2023 1328   BUN 8 07/14/2023 1328   BUN 11 01/06/2021 1447   CREATININE 0.64 07/14/2023 1328   CALCIUM 9.2 07/14/2023 1328   PROT 6.4 (L) 12/02/2022 1801   PROT 6.6 01/06/2021 1447   ALBUMIN 3.4 (L) 12/02/2022 1801   ALBUMIN 3.8 01/06/2021 1447   AST 20 12/02/2022 1801   ALT 16 12/02/2022 1801   ALKPHOS 59 12/02/2022 1801   BILITOT 0.8 12/02/2022 1801   BILITOT <0.2 01/06/2021 1447   GFRNONAA >60 07/14/2023 1328   GFRAA 94 01/06/2021 1447   Lab Results  Component Value Date    CHOL 166 09/24/2016   HDL 54 09/24/2016   LDLCALC  103 (H) 09/24/2016   TRIG 46 09/24/2016   CHOLHDL 3.1 09/24/2016   Lab Results  Component Value Date   HGBA1C 5.3 09/24/2016   No results found for: "VITAMINB12" Lab Results  Component Value Date   TSH 1.205 09/24/2016    Margie Ege, AGNP-C, DNP 08/14/2023, 9:39 PM [facility] [address] [place], [place] [address] [phone]

## 2023-08-15 ENCOUNTER — Ambulatory Visit: Payer: Medicare Other | Admitting: Neurology

## 2023-08-15 ENCOUNTER — Telehealth: Payer: Self-pay | Admitting: Neurology

## 2023-08-15 ENCOUNTER — Encounter: Payer: Self-pay | Admitting: Neurology

## 2023-08-15 VITALS — Ht 67.0 in | Wt 139.5 lb

## 2023-08-15 DIAGNOSIS — R55 Syncope and collapse: Secondary | ICD-10-CM

## 2023-08-15 MED ORDER — OXCARBAZEPINE 150 MG PO TABS: 150.0000 mg | ORAL_TABLET | Freq: Two times a day (BID) | ORAL | 3 refills | Status: DC

## 2023-08-15 NOTE — Telephone Encounter (Signed)
refilled 

## 2023-08-15 NOTE — Telephone Encounter (Signed)
Pt calling to correct where to send prescription for OXcarbazepine (TRILEPTAL) 150 MG tablet. Need prescription resent to [facility] [phone]

## 2023-08-15 NOTE — Patient Instructions (Addendum)
Great to see you today.  We will update labs today I will refill Trileptal.  Please let me know if any recurrent spells.  See you in 1 year.  Thanks!!

## 2023-08-17 LAB — COMPREHENSIVE METABOLIC PANEL
ALT: 8 IU/L (ref 0–32)
AST: 15 IU/L (ref 0–40)
Albumin: 3.9 g/dL (ref 3.7–4.7)
Alkaline Phosphatase: 85 IU/L (ref 44–121)
BUN/Creatinine Ratio: 14 (ref 12–28)
BUN: 10 mg/dL (ref 8–27)
Bilirubin Total: 0.4 mg/dL (ref 0.0–1.2)
CO2: 23 mmol/L (ref 20–29)
Calcium: 9.3 mg/dL (ref 8.7–10.3)
Chloride: 98 mmol/L (ref 96–106)
Creatinine, Ser: 0.74 mg/dL (ref 0.57–1.00)
Globulin, Total: 2.5 g/dL (ref 1.5–4.5)
Glucose: 125 mg/dL — ABNORMAL HIGH (ref 70–99)
Potassium: 4.1 mmol/L (ref 3.5–5.2)
Sodium: 133 mmol/L — ABNORMAL LOW (ref 134–144)
Total Protein: 6.4 g/dL (ref 6.0–8.5)
eGFR: 81 mL/min/{1.73_m2} (ref >59)

## 2023-08-17 LAB — 10-HYDROXYCARBAZEPINE: Oxcarbazepine SerPl-Mcnc: 5 ug/mL — ABNORMAL LOW (ref 10–35)

## 2023-08-28 DIAGNOSIS — M7989 Other specified soft tissue disorders: Secondary | ICD-10-CM | POA: Insufficient documentation

## 2023-08-28 NOTE — Progress Notes (Unsigned)
Cardiology Office Note:   Date:  08/28/2023  ID:  Sarah [name], DOB 1941-01-10, MRN [id number] PCP: Barbie Banner, MD  [facility] Providers Cardiologist:  None {  History of Present Illness:   Sarah [name] is a 82 y.o. female ***  She had recently been followed by Dr. Eldridge Dace.   She had a history of cardiac cath in October 17.    She used a cardiac monitor in 2020 demonstrated no significant arrhythmias.  ***    ROS: ***  Studies Reviewed:    EKG:       ***  Risk Assessment/Calculations:   {Does this patient have ATRIAL FIBRILLATION?:[phone]} No BP recorded.  {Refresh Note OR Click here to enter BP  :1}***        Physical Exam:   VS:  LMP  (LMP Unknown)    Wt Readings from Last 3 Encounters:  08/15/23 139 lb 8 oz (63.3 kg)  07/28/23 136 lb 12.8 oz (62.1 kg)  07/14/23 136 lb 12.8 oz (62.1 kg)     GEN: Well nourished, well developed in no acute distress NECK: No JVD; No carotid bruits CARDIAC: ***RRR, no murmurs, rubs, gallops RESPIRATORY:  Clear to auscultation without rales, wheezing or rhonchi  ABDOMEN: Soft, non-tender, non-distended EXTREMITIES:  No edema; No deformity   ASSESSMENT AND PLAN:   CAD:  ***  Mild to moderate.  No angina at this time on medical therapy. She has NTG to use if needed.  Leg edema:   ***  I encouraged her to elevate her legs.  HTN:  ***   Low readings are in the morning.  SHe may take her medicine a little later in the day.     {Are you ordering a CV Procedure (e.g. stress test, cath, DCCV, TEE, etc)?   Press F2        :[id number]}  Follow up ***  Signed, Rollene Rotunda, MD

## 2023-08-29 ENCOUNTER — Encounter: Payer: Self-pay | Admitting: Cardiology

## 2023-08-29 ENCOUNTER — Ambulatory Visit: Payer: Medicare Other | Attending: Cardiology | Admitting: Cardiology

## 2023-08-29 VITALS — BP 102/50 | HR 65 | Ht 67.0 in | Wt 140.0 lb

## 2023-08-29 DIAGNOSIS — I2510 Atherosclerotic heart disease of native coronary artery without angina pectoris: Secondary | ICD-10-CM | POA: Diagnosis not present

## 2023-08-29 DIAGNOSIS — M7989 Other specified soft tissue disorders: Secondary | ICD-10-CM

## 2023-08-29 DIAGNOSIS — I10 Essential (primary) hypertension: Secondary | ICD-10-CM | POA: Diagnosis not present

## 2023-08-29 NOTE — Patient Instructions (Signed)
Medication Instructions:  No changes      Lab Work: Not needed    Testing/Procedures: Not needed   Follow-Up: At [facility], you and your health needs are our priority.  As part of our continuing mission to provide you with exceptional heart care, we have created designated Provider Care Teams.  These Care Teams include your primary Cardiologist (physician) and Advanced Practice Providers (APPs -  Physician Assistants and Nurse Practitioners) who all work together to provide you with the care you need, when you need it.     Your next appointment:   As needed  The format for your next appointment:   In Person  Provider:   Rollene Rotunda, MD    Other Instructions   Cardiac clearance for upcoming surgery , no testing needed

## 2023-09-14 ENCOUNTER — Ambulatory Visit: Payer: Self-pay | Admitting: Surgery

## 2023-09-19 NOTE — Progress Notes (Signed)
COVID Vaccine received:  []  No [x]  Yes Date of any COVID positive Test in last 90 days:  PCP - Benedetto Goad, MD [phone] (Work)  [phone] (Fax)  Cardiologist - Rollene Rotunda, MD  Cardiac Clearance in 08-29-2023 Epic note Neurology- Stephanie Acre, MD -retired, Margie Ege, NP  Chest x-ray - 12-02-22 Epic EKG - 08-29-2023 Epic Stress Test - 09-25-16 Epic ECHO - 09-25-16 Epic Cardiac Cath - 09-26-16 Epic by Dr. Eldridge Dace  PCR screen: []  Ordered & Completed []   No Order but Needs PROFEND     [x]   N/A for this surgery  Surgery Plan:  [x]  Ambulatory   []  Outpatient in bed  []  Admit Anesthesia:    [x]  General  []  Spinal  []   Choice []   MAC  Bowel Prep - [x]  No  []   Yes ______  Pacemaker / ICD device [x]  No []  Yes   Spinal Cord Stimulator:[x]  No []  Yes       History of Sleep Apnea? [x]  No []  Yes   CPAP used?- [x]  No []  Yes    Does the patient monitor blood sugar?   [x]  N/A   []  No []  Yes  Patient has: [x]  NO Hx DM   []  Pre-DM   []  DM1  []   DM2  .Blood Thinner / Instructions: None Aspirin Instructions:  none  ERAS Protocol Ordered: []  No  [x]  Yes PRE-SURGERY []  ENSURE  []  G2   [x]  No Drink Ordered Patient is to be NPO after: 0800  Dental hx: []  Dentures:  [x]  N/A      []  Bridge or Partial:                   []  Loose or Damaged teeth:     Activity level:  Can go up a flight of stairs and perform activities of daily living without stopping and without symptoms of chest pain or shortness of breath.   Anesthesia review:  Nonobstructive CAD.  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  Hx of syncope and collapse, no recent episodes, managed by Trileptal, very HOH- no HAs yet, GERD  Patient denies shortness of breath, fever, cough and chest pain at PAT appointment.  Patient verbalized understanding and agreement to the Pre-Surgical Instructions that were given to them at this PAT appointment. Patient was also educated of the need to review these PAT instructions again prior to her surgery.I  reviewed the appropriate phone numbers to call if they have any and questions or concerns.

## 2023-09-19 NOTE — Patient Instructions (Addendum)
SURGICAL WAITING ROOM VISITATION Patients having surgery or a procedure may have no more than 2 support people in the waiting area - these visitors may rotate in the visitor waiting room.   Due to an increase in RSV and influenza rates and associated hospitalizations, children ages 83 and under may not visit patients in [facility] hospitals. If the patient needs to stay at the hospital during part of their recovery, the visitor guidelines for inpatient rooms apply.  PRE-OP VISITATION  Pre-op nurse will coordinate an appropriate time for 1 support person to accompany the patient in pre-op.  This support person may not rotate.  This visitor will be contacted when the time is appropriate for the visitor to come back in the pre-op area.  Please refer to the [facility] website for the visitor guidelines for Inpatients (after your surgery is over and you are in a regular room).  You are not required to quarantine at this time prior to your surgery. However, you must do this: Hand Hygiene often Do NOT share personal items Notify your provider if you are in close contact with someone who has COVID or you develop fever 100.4 or greater, new onset of sneezing, cough, sore throat, shortness of breath or body aches.  If you test positive for Covid or have been in contact with anyone that has tested positive in the last 10 days please notify you surgeon.    Your procedure is scheduled on:  Tuesday  October 03, 2023  Report to [facility] Main Entrance: [place] entrance where the [employer] is available.   Report to admitting at:  08:45   AM  Call this number if you have any questions or problems the morning of surgery [phone]  Do not eat food after Midnight the night prior to your surgery/procedure.  After Midnight you may have the following liquids until  08:00 AM  DAY OF SURGERY  Clear Liquid Diet Water Black Coffee (sugar ok, NO MILK/CREAM OR CREAMERS)  Tea (sugar ok, NO  MILK/CREAM OR CREAMERS) regular and decaf                             Plain Jell-O  with no fruit (NO RED)                                           Fruit ices (not with fruit pulp, NO RED)                                     Popsicles (NO RED)                                                                  Juice: NO CITRUS JUICES: only apple, WHITE grape, WHITE cranberry Sports drinks like Gatorade or Powerade (NO RED)               FOLLOW ANY ADDITIONAL PRE OP INSTRUCTIONS YOU RECEIVED FROM YOUR SURGEON'S OFFICE!!!   Oral Hygiene is also important to reduce your risk  of infection.        Remember - BRUSH YOUR TEETH THE MORNING OF SURGERY WITH YOUR REGULAR TOOTHPASTE  Do NOT smoke after Midnight the night before surgery.  STOP TAKING all Vitamins, Herbs and supplements 1 week before your surgery.   Take ONLY these medicines the morning of surgery with A SIP OF WATER: montelukast (Singulair), oxcarbazepine (Trileptal), levothyroxine.  You may take Tylenol if needed for pain.  You may use your Flonase nasal spray and your Eye drops if needed.     You may not have any metal on your body including hair pins, jewelry, and body piercing  Do not wear make-up, lotions, powders, perfumes, or deodorant  Do not wear nail polish including gel and S&S, artificial / acrylic nails, or any other type of covering on natural nails including finger and toenails. If you have artificial nails, gel coating, etc., that needs to be removed by a nail salon, Please have this removed prior to surgery. Not doing so may mean that your surgery could be cancelled or delayed if the Surgeon or anesthesia staff feels like they are unable to monitor you safely.   Do not shave 48 hours prior to surgery to avoid nicks in your skin which may contribute to postoperative infections.   Contacts, Hearing Aids, dentures or bridgework may not be worn into surgery. DENTURES WILL BE REMOVED PRIOR TO SURGERY PLEASE DO NOT APPLY "Poly  grip" OR ADHESIVES!!!  Patients discharged on the day of surgery will not be allowed to drive home.  Someone NEEDS to stay with you for the first 24 hours after anesthesia.  Do not bring your home medications to the hospital. The Pharmacy will dispense medications listed on your medication list to you during your admission in the Hospital.  Special Instructions: Bring a copy of your healthcare power of attorney and living will documents the day of surgery, if you wish to have them scanned into your [place] [facility] Records- EPIC  Please read over the following fact sheets you were given: IF YOU HAVE QUESTIONS ABOUT YOUR PRE-OP INSTRUCTIONS, PLEASE CALL [phone]   [facility] - Preparing for Surgery Before surgery, you can play an important role.  Because skin is not sterile, your skin needs to be as free of germs as possible.  You can reduce the number of germs on your skin by washing with CHG (chlorahexidine gluconate) soap before surgery.  CHG is an antiseptic cleaner which kills germs and bonds with the skin to continue killing germs even after washing. Please DO NOT use if you have an allergy to CHG or antibacterial soaps.  If your skin becomes reddened/irritated stop using the CHG and inform your nurse when you arrive at Short Stay. Do not shave (including legs and underarms) for at least 48 hours prior to the first CHG shower.  You may shave your face/neck.  Please follow these instructions carefully:  1.  Shower with CHG Soap the night before surgery and the  morning of surgery.  2.  If you choose to wash your hair, wash your hair first as usual with your normal  shampoo.  3.  After you shampoo, rinse your hair and body thoroughly to remove the shampoo.                             4.  Use CHG as you would any other liquid soap.  You can apply chg directly to the  skin and wash.  Gently with a scrungie or clean washcloth.  5.  Apply the CHG Soap to your body ONLY FROM THE NECK DOWN.    Do not use on face/ open                           Wound or open sores. Avoid contact with eyes, ears mouth and genitals (private parts).                       Wash face,  Genitals (private parts) with your normal soap.             6.  Wash thoroughly, paying special attention to the area where your  surgery  will be performed.  7.  Thoroughly rinse your body with warm water from the neck down.  8.  DO NOT shower/wash with your normal soap after using and rinsing off the CHG Soap.            9.  Pat yourself dry with a clean towel.            10.  Wear clean pajamas.            11.  Place clean sheets on your bed the night of your first shower and do not  sleep with pets.  ON THE DAY OF SURGERY : Do not apply any lotions/deodorants the morning of surgery.  Please wear clean clothes to the hospital/surgery center.     FAILURE TO FOLLOW THESE INSTRUCTIONS MAY RESULT IN THE CANCELLATION OF YOUR SURGERY  PATIENT SIGNATURE_________________________________  NURSE SIGNATURE__________________________________  ________________________________________________________________________

## 2023-09-20 ENCOUNTER — Encounter (HOSPITAL_COMMUNITY)
Admission: RE | Admit: 2023-09-20 | Discharge: 2023-09-20 | Disposition: A | Discharge status: Home / Self Care | Payer: Medicare Other | Source: Ambulatory Visit | Attending: Surgery | Admitting: Surgery

## 2023-09-20 ENCOUNTER — Other Ambulatory Visit: Payer: Self-pay

## 2023-09-20 ENCOUNTER — Encounter (HOSPITAL_COMMUNITY): Payer: Self-pay

## 2023-09-20 VITALS — BP 114/57 | HR 60 | Temp 98.3°F | Resp 14 | Ht 67.0 in | Wt 135.0 lb

## 2023-09-20 DIAGNOSIS — K8020 Calculus of gallbladder without cholecystitis without obstruction: Secondary | ICD-10-CM | POA: Diagnosis not present

## 2023-09-20 DIAGNOSIS — Z01812 Encounter for preprocedural laboratory examination: Secondary | ICD-10-CM | POA: Insufficient documentation

## 2023-09-20 DIAGNOSIS — I10 Essential (primary) hypertension: Secondary | ICD-10-CM | POA: Diagnosis not present

## 2023-09-20 DIAGNOSIS — E039 Hypothyroidism, unspecified: Secondary | ICD-10-CM | POA: Diagnosis not present

## 2023-09-20 DIAGNOSIS — Z01818 Encounter for other preprocedural examination: Secondary | ICD-10-CM

## 2023-09-20 DIAGNOSIS — I2510 Atherosclerotic heart disease of native coronary artery without angina pectoris: Secondary | ICD-10-CM | POA: Insufficient documentation

## 2023-09-20 HISTORY — DX: Atherosclerotic heart disease of native coronary artery without angina pectoris: I25.10

## 2023-09-20 HISTORY — DX: Unspecified hearing loss, unspecified ear: H91.90

## 2023-09-20 LAB — CBC
HCT: 38.3 % (ref 36.0–46.0)
Hemoglobin: 12.5 g/dL (ref 12.0–15.0)
MCH: 30.4 pg (ref 26.0–34.0)
MCHC: 32.6 g/dL (ref 30.0–36.0)
MCV: 93.2 fL (ref 80.0–100.0)
Platelets: 231 10*3/uL (ref 150–400)
RBC: 4.11 MIL/uL (ref 3.87–5.11)
RDW: 12.5 % (ref 11.5–15.5)
WBC: 4.7 10*3/uL (ref 4.0–10.5)
nRBC: 0 % (ref 0.0–0.2)

## 2023-09-20 LAB — BASIC METABOLIC PANEL
Anion gap: 7 (ref 5–15)
BUN: 9 mg/dL (ref 8–23)
CO2: 26 mmol/L (ref 22–32)
Calcium: 9 mg/dL (ref 8.9–10.3)
Chloride: 101 mmol/L (ref 98–111)
Creatinine, Ser: 0.57 mg/dL (ref 0.44–1.00)
GFR, Estimated: >60 mL/min (ref >60)
Glucose, Bld: 92 mg/dL (ref 70–99)
Potassium: 4.1 mmol/L (ref 3.5–5.1)
Sodium: 134 mmol/L — ABNORMAL LOW (ref 135–145)

## 2023-09-21 ENCOUNTER — Encounter (HOSPITAL_COMMUNITY): Payer: Self-pay

## 2023-09-21 NOTE — Progress Notes (Signed)
Case: [id number] Date/Time: 10/03/23 1045   Procedure: LAPAROSCOPIC CHOLECYSTECTOMY   Anesthesia type: General   Pre-op diagnosis: GALLSTONES   [facility]: [facility] / [facility]   Surgeons: Quentin Ore, MD       DISCUSSION: Sarah [name] is an 82 yo female who presents to PAT prior to surgery above. PMH of CAD, syncope, HTN, GERD, hypothyroidism, hx of bladder cancer s/p TURBT (07/28/2023).  Patient was seen by Cardiology for pre-op clearance on 08/29/23. Has mild nonobstructive CAD by cath. She has had evaluations in the past but does not need regular f/u. No recent episodes of syncope. Noted to be doing well from cardiac standpoint. Cleared:  "Preop: The patient has no high risk findings.  She has a high functional level.  She is not going for high risk surgery.  Therefore, according to ACC/AHA guidelines she is at acceptable risk for the planned surgery without further cardiovascular testing."  VS: BP (!) 114/57 Comment: right arm sitting  Pulse 60   Temp 36.8 C (Oral)   Resp 14   Ht 5\' 7"  (1.702 m)   Wt 61.2 kg   LMP  (LMP Unknown)   SpO2 100%   BMI 21.14 kg/m   PROVIDERS: Barbie Banner, MD   LABS: Labs reviewed: Acceptable for surgery. (all labs ordered are listed, but only abnormal results are displayed)  Labs Reviewed  BASIC METABOLIC PANEL - Abnormal; Notable for the following components:      Result Value   Sodium 134 (*)    All other components within normal limits  CBC     IMAGES:   EKG 08/29/23  NSR, rate 65  CV:  Holter monitor 12/05/2019:  Normal sinus rhythm with rare PACs and PVCs. No pathologic arrhythmias. No atrial fibrillation.  Left heart cath 09/26/2016:  Mid RCA lesion, 20 %stenosed. Prox RCA lesion, 10 %stenosed. Mid LAD lesion, 25 %stenosed. Ost 1st Diag lesion, 50 %stenosed. The left ventricular systolic function is normal. The left ventricular ejection fraction is 55-65% by visual estimate. There is no aortic valve  stenosis.   Continue medical therapy at this time.  Would given sublingual NTG at discharge.  If she has recurrent discomfort, could try Imdur.  Continue aggressive preventive therapy.   Echo 09/25/2016: Study Conclusions   - Left ventricle: The cavity size was normal. Wall thickness was    normal. Systolic function was normal. The estimated ejection    fraction was in the range of 60% to 65%. Wall motion was normal;    there were no regional wall motion abnormalities. Features are    consistent with a pseudonormal left ventricular filling pattern,    with concomitant abnormal relaxation and increased filling    pressure (grade 2 diastolic dysfunction).   Past Medical History:  Diagnosis Date   Anemia    Cancer (HCC)    Bladder 9/23   Coronary artery disease    Degenerative arthritis    GERD (gastroesophageal reflux disease)    HOH (hard of hearing)    Very HOH, in the process of getting HAs,  She lip reads alot   Hypertension    Hypothyroidism    Syncope, near    takes Trileptal for this  no syncope recently    Past Surgical History:  Procedure Laterality Date   ABDOMINAL HYSTERECTOMY     partial   arthroscopic knee surgery  July 03, 2013   CARDIAC CATHETERIZATION N/A 09/26/2016   Procedure: Left Heart Cath and Coronary Angiography;  Surgeon: Corky Crafts, MD;  Location: [facility] INVASIVE CV LAB;  Service: Cardiovascular;  Laterality: N/A;   CARPAL TUNNEL RELEASE Bilateral    CATARACT EXTRACTION, BILATERAL     CERVICAL SPINE SURGERY     COLONOSCOPY WITH PROPOFOL N/A 12/20/2019   Procedure: COLONOSCOPY WITH PROPOFOL;  Surgeon: Jeani Hawking, MD;  [facility];  Service: Endoscopy;  Laterality: N/A;   cyst removed from eyebrow  4-5 yrs ago   CYSTOSCOPY W/ RETROGRADES Bilateral 07/26/2022   Procedure: CYSTOSCOPY WITH RETROGRADE PYELOGRAM;  Surgeon: Belva Agee, MD;  [facility];  Service: Urology;  Laterality: Bilateral;   CYSTOSCOPY W/ RETROGRADES  Bilateral 07/28/2023   Procedure: CYSTOSCOPY WITH RETROGRADE PYELOGRAM;  Surgeon: Loletta Parish., MD;  [facility];  Service: Urology;  Laterality: Bilateral;   HEMOSTASIS CLIP PLACEMENT  12/20/2019   Procedure: HEMOSTASIS CLIP PLACEMENT;  Surgeon: Jeani Hawking, MD;  [facility];  Service: Endoscopy;;   LUMBAR SPINE SURGERY     POLYPECTOMY  12/20/2019   Procedure: POLYPECTOMY;  Surgeon: Jeani Hawking, MD;  [facility];  Service: Endoscopy;;   SUBMUCOSAL LIFTING INJECTION  12/20/2019   Procedure: SUBMUCOSAL LIFTING INJECTION;  Surgeon: Jeani Hawking, MD;  [facility];  Service: Endoscopy;;   TONSILLECTOMY  age 75 or 71   TOTAL HIP ARTHROPLASTY Right 12/03/2022   Procedure: TOTAL HIP ARTHROPLASTY ANTERIOR APPROACH;  Surgeon: Samson Frederic, MD;  [facility];  Service: Orthopedics;  Laterality: Right;   TOTAL KNEE ARTHROPLASTY Right 12/18/2013   Procedure: RIGHT TOTAL KNEE ARTHROPLASTY;  Surgeon: Jacki Cones, MD;  [facility];  Service: Orthopedics;  Laterality: Right;   TRANSURETHRAL RESECTION OF BLADDER TUMOR N/A 07/26/2022   Procedure: TRANSURETHRAL RESECTION OF BLADDER TUMOR (TURBT);  Surgeon: Belva Agee, MD;  [facility];  Service: Urology;  Laterality: N/A;   TRANSURETHRAL RESECTION OF BLADDER TUMOR N/A 07/28/2023   Procedure: TRANSURETHRAL RESECTION OF BLADDER TUMOR (TURBT);  Surgeon: Loletta Parish., MD;  [facility];  Service: Urology;  Laterality: N/A;  30 MINS    MEDICATIONS:  acetaminophen (TYLENOL) 500 MG tablet   Artificial Tear Solution (SOOTHE XP OP)   Calcium Carb-Cholecalciferol (CALCIUM 600 + D PO)   cyanocobalamin (VITAMIN B12) 500 MCG tablet   estradiol (ESTRACE) 1 MG tablet   Ferrous Sulfate (IRON PO)   fluticasone (FLONASE) 50 MCG/ACT nasal spray   levothyroxine (SYNTHROID, LEVOTHROID) 75 MCG tablet   montelukast (SINGULAIR) 10 MG tablet   OXcarbazepine (TRILEPTAL) 150 MG tablet   traMADol  (ULTRAM) 50 MG tablet   No current facility-administered medications for this encounter.   Marcille Blanco [facility] Short Stay/Anesthesiology [facility] Phone [phone] 09/21/2023 11:45 AM

## 2023-09-21 NOTE — Anesthesia Preprocedure Evaluation (Addendum)
Anesthesia Evaluation  Patient identified by MRN, date of birth, ID band Patient awake    Reviewed: Allergy & Precautions, NPO status , Patient's Chart, lab work & pertinent test results  Airway Mallampati: II  TM Distance: >3 FB Neck ROM: Full    Dental  (+) Poor Dentition   Pulmonary neg pulmonary ROS   Pulmonary exam normal        Cardiovascular hypertension, Pt. on medications Normal cardiovascular exam     Neuro/Psych  Headaches  negative psych ROS   GI/Hepatic negative GI ROS, Neg liver ROS,,,  Endo/Other  Hypothyroidism    Renal/GU negative Renal ROS  negative genitourinary   Musculoskeletal  (+) Arthritis , Osteoarthritis,    Abdominal Normal abdominal exam  (+)   Peds negative pediatric ROS (+)  Hematology negative hematology ROS (+)   Anesthesia Other Findings   Reproductive/Obstetrics negative OB ROS                             Anesthesia Physical Anesthesia Plan  ASA: 2  Anesthesia Plan: General   Post-op Pain Management:    Induction: Intravenous  PONV Risk Score and Plan: 4 or greater and Ondansetron, Dexamethasone and Treatment may vary due to age or medical condition  Airway Management Planned: Oral ETT  Additional Equipment: None  Intra-op Plan:   Post-operative Plan: Extubation in OR  Informed Consent: I have reviewed the patients History and Physical, chart, labs and discussed the procedure including the risks, benefits and alternatives for the proposed anesthesia with the patient or authorized representative who has indicated his/her understanding and acceptance.     Dental advisory given  Plan Discussed with: CRNA  Anesthesia Plan Comments: (See PAT note from 10/16 by K Gekas PA-C )        Anesthesia Quick Evaluation

## 2023-10-03 ENCOUNTER — Other Ambulatory Visit: Payer: Self-pay

## 2023-10-03 ENCOUNTER — Ambulatory Visit (HOSPITAL_BASED_OUTPATIENT_CLINIC_OR_DEPARTMENT_OTHER): Payer: Medicare Other | Admitting: Registered Nurse

## 2023-10-03 ENCOUNTER — Encounter (HOSPITAL_COMMUNITY): Payer: Self-pay | Admitting: Surgery

## 2023-10-03 ENCOUNTER — Ambulatory Visit (HOSPITAL_COMMUNITY): Payer: Medicare Other | Admitting: Medical

## 2023-10-03 ENCOUNTER — Ambulatory Visit (HOSPITAL_COMMUNITY)
Admission: RE | Admit: 2023-10-03 | Discharge: 2023-10-03 | Disposition: A | Discharge status: Home / Self Care | Payer: Medicare Other | Source: Ambulatory Visit | Attending: Surgery | Admitting: Surgery

## 2023-10-03 ENCOUNTER — Encounter (HOSPITAL_COMMUNITY)
Admission: RE | Disposition: A | Discharge status: Home / Self Care | Payer: Self-pay | Source: Ambulatory Visit | Attending: Surgery

## 2023-10-03 DIAGNOSIS — I10 Essential (primary) hypertension: Secondary | ICD-10-CM | POA: Diagnosis not present

## 2023-10-03 DIAGNOSIS — K8010 Calculus of gallbladder with chronic cholecystitis without obstruction: Secondary | ICD-10-CM | POA: Diagnosis not present

## 2023-10-03 DIAGNOSIS — E039 Hypothyroidism, unspecified: Secondary | ICD-10-CM | POA: Insufficient documentation

## 2023-10-03 DIAGNOSIS — K811 Chronic cholecystitis: Secondary | ICD-10-CM | POA: Diagnosis not present

## 2023-10-03 DIAGNOSIS — Z79899 Other long term (current) drug therapy: Secondary | ICD-10-CM | POA: Insufficient documentation

## 2023-10-03 DIAGNOSIS — M1990 Unspecified osteoarthritis, unspecified site: Secondary | ICD-10-CM | POA: Diagnosis not present

## 2023-10-03 DIAGNOSIS — Z8249 Family history of ischemic heart disease and other diseases of the circulatory system: Secondary | ICD-10-CM | POA: Insufficient documentation

## 2023-10-03 DIAGNOSIS — K8020 Calculus of gallbladder without cholecystitis without obstruction: Secondary | ICD-10-CM | POA: Diagnosis present

## 2023-10-03 HISTORY — PX: CHOLECYSTECTOMY: SHX55

## 2023-10-03 SURGERY — LAPAROSCOPIC CHOLECYSTECTOMY
Anesthesia: General

## 2023-10-03 MED ORDER — ONDANSETRON HCL 4 MG/2ML IJ SOLN
INTRAMUSCULAR | Status: AC
  Filled 2023-10-03: qty 2

## 2023-10-03 MED ORDER — LACTATED RINGERS IV SOLN: INTRAVENOUS | Status: DC

## 2023-10-03 MED ORDER — GABAPENTIN 300 MG PO CAPS
300.0000 mg | ORAL_CAPSULE | ORAL | Status: AC
  Administered 2023-10-03: 300 mg via ORAL
  Filled 2023-10-03: qty 1

## 2023-10-03 MED ORDER — CHLORHEXIDINE GLUCONATE CLOTH 2 % EX PADS: 6.0000 | MEDICATED_PAD | Freq: Once | CUTANEOUS | Status: DC

## 2023-10-03 MED ORDER — ACETAMINOPHEN 325 MG PO TABS: 325.0000 mg | ORAL_TABLET | ORAL | Status: DC | PRN

## 2023-10-03 MED ORDER — HYDROCODONE-ACETAMINOPHEN 7.5-325 MG PO TABS
ORAL_TABLET | ORAL | Status: AC
  Filled 2023-10-03: qty 1

## 2023-10-03 MED ORDER — DEXAMETHASONE SODIUM PHOSPHATE 10 MG/ML IJ SOLN
INTRAMUSCULAR | Status: DC | PRN
  Administered 2023-10-03: 8 mg via INTRAVENOUS

## 2023-10-03 MED ORDER — PROMETHAZINE HCL 25 MG/ML IJ SOLN
12.5000 mg | Freq: Once | INTRAMUSCULAR | Status: DC | PRN
Start: 2023-10-03 — End: 2023-10-03

## 2023-10-03 MED ORDER — ACETAMINOPHEN 325 MG PO TABS
ORAL_TABLET | ORAL | Status: AC
  Administered 2023-10-03: 650 mg
  Filled 2023-10-03: qty 2

## 2023-10-03 MED ORDER — BUPIVACAINE HCL (PF) 0.25 % IJ SOLN
INTRAMUSCULAR | Status: AC
  Filled 2023-10-03: qty 30

## 2023-10-03 MED ORDER — FENTANYL CITRATE (PF) 100 MCG/2ML IJ SOLN
INTRAMUSCULAR | Status: AC
  Filled 2023-10-03: qty 2

## 2023-10-03 MED ORDER — FENTANYL CITRATE (PF) 100 MCG/2ML IJ SOLN
INTRAMUSCULAR | Status: DC | PRN
  Administered 2023-10-03: 25 ug via INTRAVENOUS
  Administered 2023-10-03: 100 ug via INTRAVENOUS

## 2023-10-03 MED ORDER — PROPOFOL 10 MG/ML IV BOLUS
INTRAVENOUS | Status: DC | PRN
  Administered 2023-10-03: 150 mg via INTRAVENOUS

## 2023-10-03 MED ORDER — PROMETHAZINE (PHENERGAN) 6.25MG IN NS 50ML IVPB
6.2500 mg | Freq: Once | INTRAVENOUS | Status: AC
  Administered 2023-10-03: 6.25 mg via INTRAVENOUS
  Filled 2023-10-03: qty 6.25
  Filled 2023-10-03: qty 50

## 2023-10-03 MED ORDER — ONDANSETRON HCL 4 MG/2ML IJ SOLN: 4.0000 mg | Freq: Once | INTRAMUSCULAR | Status: DC | PRN

## 2023-10-03 MED ORDER — LACTATED RINGERS IR SOLN
Status: DC | PRN
Start: 2023-10-03 — End: 2023-10-03
  Administered 2023-10-03: 1000 mL

## 2023-10-03 MED ORDER — DEXAMETHASONE SODIUM PHOSPHATE 10 MG/ML IJ SOLN
INTRAMUSCULAR | Status: AC
  Filled 2023-10-03: qty 1

## 2023-10-03 MED ORDER — TRAMADOL HCL 50 MG PO TABS
50.0000 mg | ORAL_TABLET | Freq: Four times a day (QID) | ORAL | 0 refills | Status: AC | PRN
Start: 2023-10-03 — End: 2024-10-02

## 2023-10-03 MED ORDER — ROCURONIUM BROMIDE 10 MG/ML (PF) SYRINGE
PREFILLED_SYRINGE | INTRAVENOUS | Status: AC
  Filled 2023-10-03: qty 10

## 2023-10-03 MED ORDER — HYDROCODONE-ACETAMINOPHEN 5-325 MG PO TABS
ORAL_TABLET | ORAL | Status: AC
  Filled 2023-10-03: qty 1

## 2023-10-03 MED ORDER — CHLORHEXIDINE GLUCONATE 0.12 % MT SOLN: 15.0000 mL | Freq: Once | OROMUCOSAL | Status: AC

## 2023-10-03 MED ORDER — ONDANSETRON HCL 4 MG/2ML IJ SOLN
INTRAMUSCULAR | Status: DC | PRN
  Administered 2023-10-03: 4 mg via INTRAVENOUS

## 2023-10-03 MED ORDER — CIPROFLOXACIN IN D5W 400 MG/200ML IV SOLN
400.0000 mg | INTRAVENOUS | Status: AC
  Administered 2023-10-03: 400 mg via INTRAVENOUS
  Filled 2023-10-03: qty 200

## 2023-10-03 MED ORDER — ENOXAPARIN SODIUM 40 MG/0.4ML IJ SOSY
40.0000 mg | PREFILLED_SYRINGE | Freq: Once | INTRAMUSCULAR | Status: AC
  Administered 2023-10-03: 40 mg via SUBCUTANEOUS
  Filled 2023-10-03: qty 0.4

## 2023-10-03 MED ORDER — SUGAMMADEX SODIUM 200 MG/2ML IV SOLN
INTRAVENOUS | Status: DC | PRN
  Administered 2023-10-03: 150 mg via INTRAVENOUS

## 2023-10-03 MED ORDER — ORAL CARE MOUTH RINSE
15.0000 mL | Freq: Once | OROMUCOSAL | Status: AC
  Administered 2023-10-03: 15 mL via OROMUCOSAL

## 2023-10-03 MED ORDER — ROCURONIUM BROMIDE 10 MG/ML (PF) SYRINGE
PREFILLED_SYRINGE | INTRAVENOUS | Status: DC | PRN
  Administered 2023-10-03: 30 mg via INTRAVENOUS
  Administered 2023-10-03: 15 mg via INTRAVENOUS

## 2023-10-03 MED ORDER — ACETAMINOPHEN 500 MG PO TABS
1000.0000 mg | ORAL_TABLET | ORAL | Status: AC
  Administered 2023-10-03: 500 mg via ORAL
  Filled 2023-10-03: qty 2

## 2023-10-03 MED ORDER — LIDOCAINE 2% (20 MG/ML) 5 ML SYRINGE
INTRAMUSCULAR | Status: DC | PRN
  Administered 2023-10-03: 100 mg via INTRAVENOUS

## 2023-10-03 MED ORDER — FENTANYL CITRATE PF 50 MCG/ML IJ SOSY: 25.0000 ug | PREFILLED_SYRINGE | INTRAMUSCULAR | Status: DC | PRN

## 2023-10-03 MED ORDER — PROMETHAZINE HCL 25 MG/ML IJ SOLN
INTRAMUSCULAR | Status: AC
  Filled 2023-10-03: qty 1

## 2023-10-03 MED ORDER — HYDROCODONE-ACETAMINOPHEN 7.5-325 MG PO TABS
1.0000 | ORAL_TABLET | Freq: Once | ORAL | Status: AC | PRN
  Administered 2023-10-03: 1 via ORAL

## 2023-10-03 MED ORDER — METRONIDAZOLE 500 MG/100ML IV SOLN
500.0000 mg | INTRAVENOUS | Status: AC
  Administered 2023-10-03: 500 mg via INTRAVENOUS
  Filled 2023-10-03: qty 100

## 2023-10-03 MED ORDER — BUPIVACAINE HCL (PF) 0.25 % IJ SOLN
INTRAMUSCULAR | Status: DC | PRN
  Administered 2023-10-03: 30 mL

## 2023-10-03 MED ORDER — PROPOFOL 10 MG/ML IV BOLUS
INTRAVENOUS | Status: AC
  Filled 2023-10-03: qty 20

## 2023-10-03 MED ORDER — EPHEDRINE 5 MG/ML INJ
INTRAVENOUS | Status: AC
  Filled 2023-10-03: qty 5

## 2023-10-03 MED ORDER — ACETAMINOPHEN 160 MG/5ML PO SOLN: 325.0000 mg | ORAL | Status: DC | PRN

## 2023-10-03 MED ORDER — EPHEDRINE SULFATE-NACL 50-0.9 MG/10ML-% IV SOSY
PREFILLED_SYRINGE | INTRAVENOUS | Status: DC | PRN
  Administered 2023-10-03 (×2): 5 mg via INTRAVENOUS

## 2023-10-03 SURGICAL SUPPLY — 43 items
ADH SKN CLS APL DERMABOND .7 (GAUZE/BANDAGES/DRESSINGS) ×1
APL PRP STRL LF DISP 70% ISPRP (MISCELLANEOUS) ×1
APPLIER CLIP ROT 10 11.4 M/L (STAPLE) ×1
APR CLP MED LRG 11.4X10 (STAPLE) ×1
BAG COUNTER SPONGE SURGICOUNT (BAG) IMPLANT
BAG SPEC RTRVL 10 TROC 200 (ENDOMECHANICALS) ×1
BAG SPNG CNTER NS LX DISP (BAG)
CABLE HIGH FREQUENCY MONO STRZ (ELECTRODE) ×1 IMPLANT
CATH URETL OPEN 5X70 (CATHETERS) IMPLANT
CHLORAPREP W/TINT 26 (MISCELLANEOUS) ×1 IMPLANT
CLIP APPLIE ROT 10 11.4 M/L (STAPLE) ×1 IMPLANT
COVER MAYO STAND XLG (MISCELLANEOUS) ×1 IMPLANT
COVER SURGICAL LIGHT HANDLE (MISCELLANEOUS) ×1 IMPLANT
DERMABOND ADVANCED .7 DNX12 (GAUZE/BANDAGES/DRESSINGS) ×1 IMPLANT
DRAPE C-ARM 42X120 X-RAY (DRAPES) IMPLANT
ELECT REM PT RETURN 15FT ADLT (MISCELLANEOUS) ×1 IMPLANT
ENDOLOOP SUT PDS II 0 18 (SUTURE) ×1 IMPLANT
GLOVE BIO SURGEON STRL SZ7.5 (GLOVE) ×1 IMPLANT
GLOVE INDICATOR 8.0 STRL GRN (GLOVE) ×1 IMPLANT
GOWN STRL REUS W/ TWL XL LVL3 (GOWN DISPOSABLE) ×2 IMPLANT
GOWN STRL REUS W/TWL XL LVL3 (GOWN DISPOSABLE) ×2
GRASPER SUT TROCAR 14GX15 (MISCELLANEOUS) IMPLANT
HEMOSTAT SNOW SURGICEL 2X4 (HEMOSTASIS) IMPLANT
IRRIG SUCT STRYKERFLOW 2 WTIP (MISCELLANEOUS) ×1
IRRIGATION SUCT STRKRFLW 2 WTP (MISCELLANEOUS) ×1 IMPLANT
IV CATH 14GX2 1/4 (CATHETERS) ×1 IMPLANT
KIT BASIN OR (CUSTOM PROCEDURE TRAY) ×1 IMPLANT
KIT TURNOVER KIT A (KITS) IMPLANT
NDL INSUFFLATION 14GA 120MM (NEEDLE) ×1 IMPLANT
NEEDLE INSUFFLATION 14GA 120MM (NEEDLE) ×1
PENCIL SMOKE EVACUATOR (MISCELLANEOUS) IMPLANT
POUCH RETRIEVAL ECOSAC 10 (ENDOMECHANICALS) ×1 IMPLANT
SCISSORS LAP 5X35 DISP (ENDOMECHANICALS) ×1 IMPLANT
SET TUBE SMOKE EVAC HIGH FLOW (TUBING) ×1 IMPLANT
SLEEVE Z-THREAD 5X100MM (TROCAR) ×2 IMPLANT
SPIKE FLUID TRANSFER (MISCELLANEOUS) ×1 IMPLANT
STOPCOCK 4 WAY LG BORE MALE ST (IV SETS) IMPLANT
SUT MNCRL AB 4-0 PS2 18 (SUTURE) ×1 IMPLANT
TOWEL OR 17X26 10 PK STRL BLUE (TOWEL DISPOSABLE) ×1 IMPLANT
TOWEL OR NON WOVEN STRL DISP B (DISPOSABLE) IMPLANT
TRAY LAPAROSCOPIC (CUSTOM PROCEDURE TRAY) ×1 IMPLANT
TROCAR ADV FIXATION 12X100MM (TROCAR) ×1 IMPLANT
TROCAR Z-THREAD OPTICAL 5X100M (TROCAR) ×1 IMPLANT

## 2023-10-03 NOTE — Anesthesia Procedure Notes (Signed)
Procedure Name: Intubation Date/Time: 10/03/2023 10:26 AM  Performed by: Elisabeth Cara, CRNAPre-anesthesia Checklist: Patient identified, Emergency Drugs available, Suction available, Patient being monitored and Timeout performed Patient Re-evaluated:Patient Re-evaluated prior to induction Oxygen Delivery Method: Circle system utilized Preoxygenation: Pre-oxygenation with 100% oxygen Induction Type: IV induction Ventilation: Mask ventilation without difficulty Laryngoscope Size: Mac and 4 Grade View: Grade I Tube type: Oral Tube size: 7.5 mm Number of attempts: 1 Airway Equipment and Method: Stylet Placement Confirmation: ETT inserted through vocal cords under direct vision, positive ETCO2 and breath sounds checked- equal and bilateral Secured at: 21 cm Tube secured with: Tape Dental Injury: Teeth and Oropharynx as per pre-operative assessment

## 2023-10-03 NOTE — H&P (Signed)
Admitting Physician: Hyman Hopes Gibran Veselka  Service: General Surgery  CC: Abdominal pain  Subjective   HPI: Sarah [name] is an 82 y.o. female who is here for cholecystectomy  Past Medical History:  Diagnosis Date   Anemia    Cancer (HCC)    Bladder 9/23   Coronary artery disease    Degenerative arthritis    GERD (gastroesophageal reflux disease)    HOH (hard of hearing)    Very HOH, in the process of getting HAs,  She lip reads alot   Hypertension    Hypothyroidism    Syncope, near    takes Trileptal for this  no syncope recently    Past Surgical History:  Procedure Laterality Date   ABDOMINAL HYSTERECTOMY     partial   arthroscopic knee surgery  July 03, 2013   CARDIAC CATHETERIZATION N/A 09/26/2016   Procedure: Left Heart Cath and Coronary Angiography;  Surgeon: Corky Crafts, MD;  Location: [facility] INVASIVE CV LAB;  Service: Cardiovascular;  Laterality: N/A;   CARPAL TUNNEL RELEASE Bilateral    CATARACT EXTRACTION, BILATERAL     CERVICAL SPINE SURGERY     COLONOSCOPY WITH PROPOFOL N/A 12/20/2019   Procedure: COLONOSCOPY WITH PROPOFOL;  Surgeon: Jeani Hawking, MD;  [facility];  Service: Endoscopy;  Laterality: N/A;   cyst removed from eyebrow  4-5 yrs ago   CYSTOSCOPY W/ RETROGRADES Bilateral 07/26/2022   Procedure: CYSTOSCOPY WITH RETROGRADE PYELOGRAM;  Surgeon: Belva Agee, MD;  [facility];  Service: Urology;  Laterality: Bilateral;   CYSTOSCOPY W/ RETROGRADES Bilateral 07/28/2023   Procedure: CYSTOSCOPY WITH RETROGRADE PYELOGRAM;  Surgeon: Loletta Parish., MD;  [facility];  Service: Urology;  Laterality: Bilateral;   HEMOSTASIS CLIP PLACEMENT  12/20/2019   Procedure: HEMOSTASIS CLIP PLACEMENT;  Surgeon: Jeani Hawking, MD;  [facility];  Service: Endoscopy;;   LUMBAR SPINE SURGERY     POLYPECTOMY  12/20/2019   Procedure: POLYPECTOMY;  Surgeon: Jeani Hawking, MD;  [facility];  Service: Endoscopy;;    SUBMUCOSAL LIFTING INJECTION  12/20/2019   Procedure: SUBMUCOSAL LIFTING INJECTION;  Surgeon: Jeani Hawking, MD;  [facility];  Service: Endoscopy;;   TONSILLECTOMY  age 24 or 18   TOTAL HIP ARTHROPLASTY Right 12/03/2022   Procedure: TOTAL HIP ARTHROPLASTY ANTERIOR APPROACH;  Surgeon: Samson Frederic, MD;  [facility];  Service: Orthopedics;  Laterality: Right;   TOTAL KNEE ARTHROPLASTY Right 12/18/2013   Procedure: RIGHT TOTAL KNEE ARTHROPLASTY;  Surgeon: Jacki Cones, MD;  [facility];  Service: Orthopedics;  Laterality: Right;   TRANSURETHRAL RESECTION OF BLADDER TUMOR N/A 07/26/2022   Procedure: TRANSURETHRAL RESECTION OF BLADDER TUMOR (TURBT);  Surgeon: Belva Agee, MD;  [facility];  Service: Urology;  Laterality: N/A;   TRANSURETHRAL RESECTION OF BLADDER TUMOR N/A 07/28/2023   Procedure: TRANSURETHRAL RESECTION OF BLADDER TUMOR (TURBT);  Surgeon: Loletta Parish., MD;  [facility];  Service: Urology;  Laterality: N/A;  30 MINS    Family History  Problem Relation Age of Onset   Alzheimer's disease Sister    Cancer - Lung Sister    Heart disease Brother 87   Heart disease Brother 28   Heart disease Brother 8   Cancer Brother        Bladder cancer    Social:  reports that she has never smoked. She has been exposed to tobacco smoke. She has never used smokeless tobacco. She reports that she does not  drink alcohol and does not use drugs.  Allergies:  Allergies  Allergen Reactions   Atorvastatin Other (See Comments)    Fatigue, leg cramps   Doxycycline Nausea And Vomiting   Keppra [Levetiracetam]     jitteriness   Other     Band-Aid cause patient to swell   Oxycodone-Acetaminophen Nausea And Vomiting   Tape Swelling and Other (See Comments)    redness   Penicillins Hives    Tolerated Ancef without difficulty on 26 Jul 2022   Voltaren [Diclofenac Sodium] Hives and Rash    Medications: Current Outpatient Medications  Medication  Instructions   acetaminophen (TYLENOL) 1,000 mg, Oral, Every 6 hours PRN   Artificial Tear Solution (SOOTHE XP OP) 1 drop, Both Eyes, Daily PRN   Calcium Carb-Cholecalciferol (CALCIUM 600 + D PO) 1 tablet, Oral, Daily   cyanocobalamin (VITAMIN B12) 500 mcg, Oral, Daily   estradiol (ESTRACE) 0.5 mg, Oral, Daily with breakfast   Ferrous Sulfate (IRON PO) 1 tablet, Oral, Daily   fluticasone (FLONASE) 50 MCG/ACT nasal spray 1 spray, Each Nare, As needed   levothyroxine (SYNTHROID) 75 mcg, Oral, Daily before breakfast   montelukast (SINGULAIR) 10 mg, Oral, Daily   OXcarbazepine (TRILEPTAL) 150 mg, Oral, 2 times daily   traMADol (ULTRAM) 50 mg, Oral, Every 6 hours PRN    ROS - all of the below systems have been reviewed with the patient and positives are indicated with bold text General: chills, fever or night sweats Eyes: blurry vision or double vision ENT: epistaxis or sore throat Allergy/Immunology: itchy/watery eyes or nasal congestion Hematologic/Lymphatic: bleeding problems, blood clots or swollen lymph nodes Endocrine: temperature intolerance or unexpected weight changes Breast: new or changing breast lumps or nipple discharge Resp: cough, shortness of breath, or wheezing CV: chest pain or dyspnea on exertion GI: as per HPI GU: dysuria, trouble voiding, or hematuria MSK: joint pain or joint stiffness Neuro: TIA or stroke symptoms Derm: pruritus and skin lesion changes Psych: anxiety and depression  Objective   PE Blood pressure 129/60, pulse 69, temperature 98.1 F (36.7 C), temperature source Oral, resp. rate 16, height 5\' 7"  (1.702 m), weight 61.2 kg, SpO2 98%. Constitutional: NAD; conversant; no deformities Eyes: Moist conjunctiva; no lid lag; anicteric; PERRL Neck: Trachea midline; no thyromegaly Lungs: Normal respiratory effort; no tactile fremitus CV: RRR; no palpable thrills; no pitting edema GI: Abd Soft, nontender; no palpable hepatosplenomegaly MSK: Normal range  of motion of extremities; no clubbing/cyanosis Psychiatric: Appropriate affect; alert and oriented x3 Lymphatic: No palpable cervical or axillary lymphadenopathy  No results found for this or any previous visit (from the past 24 hour(s)).  Imaging Orders  No imaging studies ordered today   RUQ [place] 05/24/23  Cholelithiasis without secondary signs of acute cholecystitis. Given the appearance of the material within the gallbladder, recommend follow-up right upper quadrant ultrasound in 6 months to exclude the possibility of underlying polyp. CBD 4.73mm   Assessment and Plan   Ms. Coch has chest pain symptoms with nausea and vomiting after eating fatty foods. This is improved since she has avoided fatty foods. She was noted to have gallstones on right upper quadrant ultrasound. Her symptoms certainly could be due to gallstones and she would benefit from cholecystectomy. I recommended laparoscopic cholecystectomy. We discussed the procedure itself as well as its risk, benefits, and alternatives. After full discussion all questions answered the patient granted consent to proceed. Before proceeding with surgery, she saw cardiology who stated she is acceptable risk for surgery.  No diagnosis found.   Quentin Ore, MD  [facility] Use [email or web address] to contact on call provider

## 2023-10-03 NOTE — Progress Notes (Signed)
[id number] Reported to Dr Arby Barrette patient took Tylenol 500 mg at 0400 this am. Asked to give 500 mg for pre op dose instead of 1000 mg.

## 2023-10-03 NOTE — Discharge Instructions (Signed)
 CHOLECYSTECTOMY POST OPERATIVE INSTRUCTIONS  Thinking Clearly  The anesthesia may cause you to feel different for 1 or 2 days. Do not drive, drink alcohol, or make any big decisions for at least 2 days.  Nutrition When you wake up, you will be able to drink small amounts of liquid. If you do not feel sick, you can slowly advance your diet to regular foods. Continue to drink lots of fluids, usually about 8 to 10 glasses per day. Eat a high-fiber diet so you don't strain during bowel movements. High-Fiber Foods Foods high in fiber include beans, bran cereals and whole-grain breads, peas, dried fruit (figs, apricots, and dates), raspberries, blackberries, strawberries, sweet corn, broccoli, baked potatoes with skin, plums, pears, apples, greens, and nuts. Activity Slowly increase your activity. Be sure to get up and walk every hour or so to prevent blood clots. No heavy lifting or strenuous activity for 4 weeks following surgery to prevent hernias at your incision sites It is normal to feel tired. You may need more sleep than usual.  Get your rest but make sure to get up and move around frequently to prevent blood clots and pneumonia.  Work and Return to School You can go back to work when you feel well enough. Discuss the timing with your surgeon. You can usually go back to school or work 1 week after an operation. If your work requires heavy lifting or strenuous activity you need to be placed on light duty for 4 weeks following surgery. You can return to gym class, sports or other physical activities 4 weeks after surgery.  Wound Care Always wash your hands before and after touching near your incision site. Do not soak in a bathtub until cleared at your follow up appointment. You may take a shower 24 hours after surgery. A small amount of drainage from the incision is normal. If the drainage is thick and yellow or the site is red, you may have an infection, so call your surgeon. If you  have a drain in one of your incisions, it will be taken out in office when the drainage stops. Steri-Strips will fall off in 7 to 10 days or they will be removed during your first office visit. If you have dermabond glue covering over the incision, allow the glue to flake off on its own. Avoid wearing tight or rough clothing. It may rub your incisions and make it harder for them to heal. Protect the new skin, especially from the sun. The sun can burn and cause darker scarring. Your scar will heal in about 4 to 6 weeks and will become softer and continue to fade over the next year.  The cosmetic appearance of the incisions will improve over the course of the first year after surgery. Sensation around your incision will return in a few weeks or months.  Bowel Movements After intestinal surgery, you may have loose watery stools for several days. If watery diarrhea lasts longer than 3 days, contact your surgeon. Pain medication (narcotics) can cause constipation. Increase the fiber in your diet with high-fiber foods if you are constipated. You can take an over the counter stool softener like Colace to avoid constipation.  Additional over the counter medications can also be used if Colace isn't sufficient (for example, Milk of Magnesia or Miralax).  Pain The amount of pain is different for each person. Some people need only 1 to 3 doses of pain control medication, while others need more. Take alternating doses of tylenol   and ibuprofen around the clock for the first five days following surgery.  This will provide a baseline of pain control and help with inflammation.  Take the narcotic pain medication in addition if needed for severe pain.  Contact Your Surgeon at [phone], if you have: Pain in your right upper abdomen like a gallbladder attack. Pain that will not go away Pain that gets worse A fever of more than 101F (38.3C) Repeated vomiting Swelling, redness, bleeding, or bad-smelling  drainage from your wound site Strong abdominal pain No bowel movement or unable to pass gas for 3 days Watery diarrhea lasting longer than 3 days  Pain Control The goal of pain control is to minimize pain, keep you moving and help you heal. Your surgical team will work with you on your pain plan. Most often a combination of therapies and medications are used to control your pain. You may also be given medication (local anesthetic) at the surgical site. This may help control your pain for several days. Extreme pain puts extra stress on your body at a time when your body needs to focus on healing. Do not wait until your pain has reached a level "10" or is unbearable before telling your doctor or nurse. It is much easier to control pain before it becomes severe. Following a laparoscopic procedure, pain is sometimes felt in the shoulder. This is due to the gas inserted into your abdomen during the procedure. Moving and walking helps to decrease the gas and the right shoulder pain.  Use the guide below for ways to manage your post-operative pain. Learn more by going to facs.org/safepaincontrol.  How Intense Is My Pain Common Therapies to Feel Better       I hardly notice my pain, and it does not interfere with my activities.  I notice my pain and it distracts me, but I can still do activities (sitting up, walking, standing).  Non-Medication Therapies  Ice (in a bag, applied over clothing at the surgical site), elevation, rest, meditation, massage, distraction (music, TV, play) walking and mild exercise Splinting the abdomen with pillows +  Non-Opioid Medications Acetaminophen (Tylenol) Non-steroidal anti-inflammatory drugs (NSAIDS) Aspirin, Ibuprofen (Motrin, Advil) Naproxen (Aleve) Take these as needed, when you feel pain. Both acetaminophen and NSAIDs help to decrease pain and swelling (inflammation).      My pain is hard to ignore and is more noticeable even when I rest.  My  pain interferes with my usual activities.  Non-Medication Therapies  +  Non-Opioid medications  Take on a regular schedule (around-the-clock) instead of as needed. (For example, Tylenol every 6 hours at 9:00 am, 3:00 pm, 9:00 pm, 3:00 am and Motrin every 6 hours at 12:00 am, 6:00 am, 12:00 pm, 6:00 pm)         I am focused on my pain, and I am not doing my daily activities.  I am groaning in pain, and I cannot sleep. I am unable to do anything.  My pain is as bad as it could be, and nothing else matters.  Non-Medication Therapies  +  Around-the-Clock Non-Opioid Medications  +  Short-acting opioids  Opioids should be used with other medications to manage severe pain. Opioids block pain and give a feeling of euphoria (feel high). Addiction, a serious side effect of opioids, is rare with short-term (a few days) use.  Examples of short-acting opioids include: Tramadol (Ultram), Hydrocodone (Norco, Vicodin), Hydromorphone (Dilaudid), Oxycodone (Oxycontin)     The above directions have been adapted from   the American College [employer] Surgical Patient Education Program.  Please refer to the ACS website if needed: https://www.facs.org/-/media/files/education/patient-ed/cholesys.ashx.   Norvell Caswell, MD [facility] [address], [address], [place], [place]  [address] ?  [address] [address], [place], [place]   [address] [phone] ? [phone] ? FAX [phone] Web site: [email or web address]  

## 2023-10-03 NOTE — Op Note (Signed)
Patient: Sarah [name] (October 12, 1941, [id number])  Date of Surgery: 10/03/2023  Preoperative Diagnosis: GALLSTONES   Postoperative Diagnosis: GALLSTONES   Surgical Procedure: LAPAROSCOPIC CHOLECYSTECTOMY:    Operative Team Members:  Surgeons and Role:    * Harjit Douds, Hyman Hopes, MD - Primary   Anesthesiologist: Leilani Able, MD CRNA: Elisabeth Cara, CRNA   Anesthesia: General   Fluids:  Total I/O In: 100 [IV Piggyback:100] Out: 15 [Blood:15]  Complications: * No complications entered in OR log *  Drains:  none   Specimen:  ID Type Source Tests Collected by Time Destination  1 : Gallbladder Tissue PATH Gallbladder SURGICAL PATHOLOGY Anallely Rosell, Hyman Hopes, MD 10/03/2023 1051      Disposition:  PACU - hemodynamically stable.  Plan of Care: Discharge to home after PACU    Indications for Procedure: Sarah [name] is a 82 y.o. female who presented with abdominal pain.  History, physical and imaging was concerning for cholecystitis.  Laparoscopic cholecystectomy was recommended for the patient.  The procedure itself, as well as the risks, benefits and alternatives were discussed with the patient.  Risks discussed included but were not limited to the risk of infection, bleeding, damage to nearby structures, need to convert to open procedure, incisional hernia, bile leak, common bile duct injury and the need for additional procedures or surgeries.  With this discussion complete and all questions answered the patient granted consent to proceed.  Findings: chronic cholecystitis  Infection status: Patient: Private Patient Elective Case Case: Elective Infection Present At Time Of Surgery (PATOS):  Inflamed gallbladder   Description of Procedure:   On the date stated above, the patient was taken to the operating room suite and placed in supine positioning.  Sequential compression devices were placed on the lower extremities to prevent blood clots.  General  endotracheal anesthesia was induced. Preoperative antibiotics were given.  The patient's abdomen was prepped and draped in the usual sterile fashion.  A time-out was completed verifying the correct patient, procedure, positioning and equipment needed for the case.  We began by anesthetizing the skin with local anesthetic and then making a 5 mm incision just below the umbilicus.  We dissected through the subcutaneous tissues to the fascia.  The fascia was grasped and elevated using a Kocher clamp.  A Veress needle was inserted into the abdomen and the abdomen was insufflated to 15 mmHg.  A 5 mm trocar was inserted in this position under optical guidance and then the abdomen was inspected.  There was no trauma to the underlying viscera with initial trocar placement.  Any abnormal findings, other than inflammation in the right upper quadrant, are listed above in the findings section.  Three additional trocars were placed, one 12 mm trocar in the subxiphoid position, one 5 mm trocar in the midline epigastric area and one 5mm trocar in the right upper quadrant subcostally.  These were placed under direct vision without any trauma to the underlying viscera.    The patient was then placed in head up, left side down positioning.  The gallbladder was identified and dissected free from its attachments to the omentum allowing the duodenum to fall away.  The infundibulum of the gallbladder was dissected free working laterally to medially.  The cystic duct and cystic artery were dissected free from surrounding connective tissue.  The infundibulum of the gallbladder was dissected off the cystic plate.  A critical view of safety was obtained with the cystic duct and cystic artery being cleared of connective tissues  and clearly the only two structures entering into the gallbladder with the liver clearly visible behind.  Clips were then applied to the cystic duct and cystic artery and then these structures were divided.  A PDS  endoloop was placed on the cystic duct stump. The gallbladder was dissected off the cystic plate, placed in an endocatch bag and removed from the 12 mm subxiphoid port site.  The clips were inspected and appeared effective.  The cystic plate was inspected and hemostasis was obtained using electrocautery.  A suction irrigator was used to clean the operative field.  Attention was turned to closure.  The 12 mm subxiphoid port site was closed using a 0-vicryl suture on a fascial suture passer.  The abdomen was desufflated.  The skin was closed using 4-0 monocryl and dermabond.  All sponge and needle counts were correct at the conclusion of the case.    Ivar Drape, MD General, Bariatric, & Minimally Invasive Surgery [facility], [place]

## 2023-10-03 NOTE — Anesthesia Postprocedure Evaluation (Signed)
Anesthesia Post Note  Patient: Sarah [name]  Procedure(s) Performed: LAPAROSCOPIC CHOLECYSTECTOMY     Patient location during evaluation: PACU Anesthesia Type: General Level of consciousness: awake Pain management: pain level controlled Vital Signs Assessment: post-procedure vital signs reviewed and stable Respiratory status: spontaneous breathing Cardiovascular status: stable Postop Assessment: no apparent nausea or vomiting Anesthetic complications: no  No notable events documented.  Last Vitals:  Vitals:   10/03/23 1300 10/03/23 1324  BP: (!) 167/69 (!) 168/81  Pulse: 63 (!) 57  Resp: 11 12  Temp:  36.5 C  SpO2: 97% 99%    Last Pain:  Vitals:   10/03/23 1400  TempSrc:   PainSc: 4                  [employer]

## 2023-10-03 NOTE — Transfer of Care (Signed)
Immediate Anesthesia Transfer of Care Note  Patient: Sarah [name]  Procedure(s) Performed: LAPAROSCOPIC CHOLECYSTECTOMY  Patient Location: PACU  Anesthesia Type:General  Level of Consciousness: awake, alert , oriented, and patient cooperative  Airway & Oxygen Therapy: Patient Spontanous Breathing and Patient connected to face mask oxygen  Post-op Assessment: Report given to RN, Post -op Vital signs reviewed and stable, and Patient moving all extremities  Post vital signs: Reviewed and stable  Last Vitals:  Vitals Value Taken Time  BP 146/76 10/03/23 1127  Temp    Pulse 66 10/03/23 1129  Resp 14 10/03/23 1129  SpO2 100 % 10/03/23 1129  Vitals shown include unfiled device data.  Last Pain:  Vitals:   10/03/23 0910  TempSrc:   PainSc: 0-No pain         Complications: No notable events documented.

## 2023-10-04 ENCOUNTER — Encounter (HOSPITAL_COMMUNITY): Payer: Self-pay | Admitting: Surgery

## 2023-10-05 LAB — SURGICAL PATHOLOGY

## 2024-08-15 ENCOUNTER — Encounter: Payer: Self-pay | Admitting: Neurology

## 2024-08-15 ENCOUNTER — Ambulatory Visit: Payer: Medicare Other | Admitting: Neurology

## 2024-08-15 VITALS — BP 148/84 | HR 80 | Wt 132.0 lb

## 2024-08-15 DIAGNOSIS — R55 Syncope and collapse: Secondary | ICD-10-CM

## 2024-08-15 NOTE — Progress Notes (Signed)
 Patient: Sarah [name] Date of Birth: [birth year]  Reason for Visit: Follow up History from: Patient Primary Neurologist: Dr. Gregg  ASSESSMENT AND PLAN 83 y.o. year old female   1.  Recurrent syncope -2 episodes of near syncope in the last year (1 in June the other in August) under stress at home times.  - Historically, in 2014 she had an abnormal EEG with similar syncopal spells and was placed on Trileptal .  She was lost to follow-up from 2014-2020.  During that time she stopped the Trileptal .  In 2020 she presented with headache and recurrent syncopal spells walking across the road she felt dizzy and blacked out.  EEG was normal.  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 Placed back on Trileptal .  She has not had any recurrent spells since being on Trileptal .  She was unable to tolerate Keppra , Topamax . - Check routine labs, Trileptal  level.  Her sodium level runs in the low 130s, may not be able to increase Trileptal  any further - Check EEG - Recommend no driving for 6 months if spells impair awareness - Next steps: Increase Trileptal  if tolerated, other options may be Depakote, Lamictal, Vimpat - Follow-up in 6 months with Dr. Gregg to establish care   HISTORY OF PRESENT ILLNESS: Today 08/15/24 Has had 2 dizzy spells since last seen. June and August. Can feel them coming on, spreads from neck up, vision goes back, but she is conscious. Both times sitting down, then goes away. Under a lot of stress. Her husband has cancer, sarcoma and he is blind.  Has remained on Trileptal  150 mg twice a day.   08/15/23 SS: She broke her hip in December, had bladder tumors removed in August this year. Has lost about 12 lbs. Remains on Trileptal  150 mg twice daily. No more spells of syncope since starting Trileptal  in 2013. Has had abnormal EEG per Dr. Rich note. She is driving, independent.  Update 08/10/22 SS: Sarah [name] is here  today for follow-up. Remains on Trileptal  150 mg twice daily. No more spells of syncope. Denies any side effects. Has been found to have bladder cancer. Had tumors removed 2 weeks ago, is going to undergo some form of chemotherapy. She feels good. Lives with her husband, he is blind. Labs from 07/20/22 show sodium level 135, creatinine 0.58, CBC was unremarkable.  Trileptal  level was 4 in Feb 2022.  HISTORY  08/06/21 Sarah [name]: Sarah [name] is a 83 year old right-handed white female with a history of recurrent episodes of syncope.  The patient is on a very low-dose of Trileptal  taking 150 milligrams twice daily.  She has not had any recurrence of symptoms since last seen.  The blood levels are quite low with the Trileptal  but it seems to be effective for her.  The patient is being treated for presumed seizures as syncopal episodes on cardiac monitor have not shown any cardiac rhythm abnormalities.  The patient gets about 4 hours of sleep at night but this is adequate for her, she has always done this.  She has good energy level during the day.  She returns for an evaluation at this time.  She is operating a motor vehicle.  REVIEW OF SYSTEMS: Out of a complete 14 system review of symptoms, the patient complains only of the following symptoms, and all other reviewed systems are negative.  See HPI  ALLERGIES: Allergies  Allergen Reactions   Atorvastatin  Other (See Comments)  Fatigue, leg cramps   Doxycycline Nausea And Vomiting   Keppra  [Levetiracetam ]     jitteriness   Other     Band-Aid cause patient to swell   Oxycodone -Acetaminophen  Nausea And Vomiting   Tape Swelling and Other (See Comments)    redness   Penicillins Hives    Tolerated Ancef  without difficulty on 26 Jul 2022   Voltaren [Diclofenac Sodium] Hives and Rash    HOME MEDICATIONS: Outpatient Medications Prior to Visit  Medication Sig Dispense Refill   acetaminophen  (TYLENOL ) 500 MG tablet Take 1,000 mg by mouth every 6 (six)  hours as needed for moderate pain.     Artificial Tear Solution (SOOTHE XP OP) Place 1 drop into both eyes daily as needed (dry eyes).     Calcium  Carb-Cholecalciferol (CALCIUM  600 + D PO) Take 1 tablet by mouth daily.     cyanocobalamin (VITAMIN B12) 500 MCG tablet Take 500 mcg by mouth daily.     estradiol  (ESTRACE ) 1 MG tablet Take 0.5 mg by mouth daily with breakfast.      Ferrous Sulfate  (IRON PO) Take 1 tablet by mouth daily.     fluticasone  (FLONASE ) 50 MCG/ACT nasal spray Place 1 spray into both nostrils as needed for allergies.      levothyroxine  (SYNTHROID , LEVOTHROID) 75 MCG tablet Take 75 mcg by mouth daily before breakfast.     montelukast (SINGULAIR) 10 MG tablet Take 10 mg by mouth daily.     OXcarbazepine  (TRILEPTAL ) 150 MG tablet Take 1 tablet (150 mg total) by mouth 2 (two) times daily. 180 tablet 3   traMADol  (ULTRAM ) 50 MG tablet Take 1 tablet (50 mg total) by mouth every 6 (six) hours as needed. 20 tablet 0   No facility-administered medications prior to visit.    PAST MEDICAL HISTORY: Past Medical History:  Diagnosis Date   Anemia    Cancer (HCC)    Bladder 9/23   Coronary artery disease    Degenerative arthritis    GERD (gastroesophageal reflux disease)    HOH (hard of hearing)    Very HOH, in the process of getting HAs,  She lip reads alot   Hypertension    Hypothyroidism    Syncope, near    takes Trileptal  for this  no syncope recently    PAST SURGICAL HISTORY: Past Surgical History:  Procedure Laterality Date   ABDOMINAL HYSTERECTOMY     partial   arthroscopic knee surgery  July 03, 2013   CARDIAC CATHETERIZATION N/A 09/26/2016   Procedure: Left Heart Cath and Coronary Angiography;  Surgeon: Candyce GORMAN Reek, MD;  Location: [facility] INVASIVE CV LAB;  Service: Cardiovascular;  Laterality: N/A;   CARPAL TUNNEL RELEASE Bilateral    CATARACT EXTRACTION, BILATERAL     CERVICAL SPINE SURGERY     CHOLECYSTECTOMY N/A 10/03/2023   Procedure: LAPAROSCOPIC  CHOLECYSTECTOMY;  Surgeon: Lyndel Deward PARAS, MD;  Location: [facility] ORS;  Service: General;  Laterality: N/A;   COLONOSCOPY WITH PROPOFOL  N/A 12/20/2019   Procedure: COLONOSCOPY WITH PROPOFOL ;  Surgeon: Rollin Dover, MD;  Location: [facility];  Service: Endoscopy;  Laterality: N/A;   cyst removed from eyebrow  4-5 yrs ago   CYSTOSCOPY W/ RETROGRADES Bilateral 07/26/2022   Procedure: CYSTOSCOPY WITH RETROGRADE PYELOGRAM;  Surgeon: Rosalind Zachary NOVAK, MD;  Location: [facility];  Service: Urology;  Laterality: Bilateral;   CYSTOSCOPY W/ RETROGRADES Bilateral 07/28/2023   Procedure: CYSTOSCOPY WITH RETROGRADE PYELOGRAM;  Surgeon: Alvaro Ricardo NOVAK Mickey., MD;  Location: [facility];  Service: Urology;  Laterality: Bilateral;   HEMOSTASIS CLIP PLACEMENT  12/20/2019   Procedure: HEMOSTASIS CLIP PLACEMENT;  Surgeon: Rollin Dover, MD;  Location: [facility];  Service: Endoscopy;;   LUMBAR SPINE SURGERY     POLYPECTOMY  12/20/2019   Procedure: POLYPECTOMY;  Surgeon: Rollin Dover, MD;  Location: [facility];  Service: Endoscopy;;   SUBMUCOSAL LIFTING INJECTION  12/20/2019   Procedure: SUBMUCOSAL LIFTING INJECTION;  Surgeon: Rollin Dover, MD;  Location: [facility];  Service: Endoscopy;;   TONSILLECTOMY  age 33 or 11   TOTAL HIP ARTHROPLASTY Right 12/03/2022   Procedure: TOTAL HIP ARTHROPLASTY ANTERIOR APPROACH;  Surgeon: Fidel Rogue, MD;  Location: [facility];  Service: Orthopedics;  Laterality: Right;   TOTAL KNEE ARTHROPLASTY Right 12/18/2013   Procedure: RIGHT TOTAL KNEE ARTHROPLASTY;  Surgeon: Tanda DELENA Heading, MD;  Location: [facility];  Service: Orthopedics;  Laterality: Right;   TRANSURETHRAL RESECTION OF BLADDER TUMOR N/A 07/26/2022   Procedure: TRANSURETHRAL RESECTION OF BLADDER TUMOR (TURBT);  Surgeon: Rosalind Zachary NOVAK, MD;  Location: [facility];  Service: Urology;  Laterality: N/A;   TRANSURETHRAL RESECTION OF BLADDER TUMOR N/A 07/28/2023   Procedure: TRANSURETHRAL RESECTION OF BLADDER TUMOR (TURBT);  Surgeon:  Alvaro Ricardo NOVAK Mickey., MD;  Location: [facility];  Service: Urology;  Laterality: N/A;  30 MINS    FAMILY HISTORY: Family History  Problem Relation Age of Onset   Alzheimer's disease Sister    Cancer - Lung Sister    Heart disease Brother 73   Heart disease Brother 83   Heart disease Brother 73   Cancer Brother        Bladder cancer    SOCIAL HISTORY: Social History   Socioeconomic History   Marital status: Married    Spouse name: Not on file   Number of children: 3   Years of education: 12   Highest education level: Not on file  Occupational History   Not on file  Tobacco Use   Smoking status: Never    Passive exposure: Past   Smokeless tobacco: Never  Vaping Use   Vaping status: Never Used  Substance and Sexual Activity   Alcohol use: No   Drug use: No   Sexual activity: Not Currently  Other Topics Concern   Not on file  Social History Narrative   Lives with husband who is blind.     Right handed   Social Drivers of Health   Financial Resource Strain: Not on file  Food Insecurity: Low Risk  (05/27/2024)   Received from [facility]   Hunger Vital Sign    Within the past 12 months, you worried that your food would run out before you got money to buy more: Never true    Within the past 12 months, the food you bought just didn't last and you didn't have money to get more. : Never true  Transportation Needs: No Transportation Needs (05/27/2024)   Received from [facility]    In the past 12 months, has lack of reliable transportation kept you from medical appointments, meetings, work or from getting things needed for daily living? : No  Physical Activity: Not on file  Stress: Not on file  Social Connections: Not on file  Intimate Partner Violence: Not At Risk (12/02/2022)   Humiliation, Afraid, Rape, and Kick questionnaire    Fear of Current or Ex-Partner: No    Emotionally Abused: No    Physically Abused: No    Sexually Abused: No   PHYSICAL  EXAM  Vitals:   08/15/24 1039  BP: (!) 148/84  Pulse: 80  SpO2: 96%  Weight: 132 lb (59.9 kg)   Body mass index is 20.67 kg/m.  Generalized: Well developed, in no acute distress  Neurological examination  Mentation: Alert oriented to time, place, history taking. Follows all commands speech and language fluent Cranial nerve II-XII: Pupils were equal round reactive to light. Extraocular movements were full, visual field were full on confrontational test. Facial sensation and strength were normal. Head turning and shoulder shrug  were normal and symmetric. Motor: The motor testing reveals 5 over 5 strength of all 4 extremities. Good symmetric motor tone is noted throughout.  Sensory: Sensory testing is intact to soft touch on all 4 extremities. No evidence of extinction is noted.  Coordination: Cerebellar testing reveals good finger-nose-finger and heel-to-shin bilaterally.  Gait and station: Gait is slightly wide-based, but steady and independent Reflexes: Deep tendon reflexes are symmetric and normal bilaterally.   DIAGNOSTIC DATA (LABS, IMAGING, TESTING) - I reviewed patient records, labs, notes, testing and imaging myself where available.  Lab Results  Component Value Date   WBC 4.7 09/20/2023   HGB 12.5 09/20/2023   HCT 38.3 09/20/2023   MCV 93.2 09/20/2023   PLT 231 09/20/2023      Component Value Date/Time   NA 134 (L) 09/20/2023 1102   NA 133 (L) 08/15/2023 1401   K 4.1 09/20/2023 1102   CL 101 09/20/2023 1102   CO2 26 09/20/2023 1102   GLUCOSE 92 09/20/2023 1102   BUN 9 09/20/2023 1102   BUN 10 08/15/2023 1401   CREATININE 0.57 09/20/2023 1102   CALCIUM  9.0 09/20/2023 1102   PROT 6.4 08/15/2023 1401   ALBUMIN 3.9 08/15/2023 1401   AST 15 08/15/2023 1401   ALT 8 08/15/2023 1401   ALKPHOS 85 08/15/2023 1401   BILITOT 0.4 08/15/2023 1401   GFRNONAA >60 09/20/2023 1102   GFRAA 94 01/06/2021 1447   Lab Results  Component Value Date   CHOL 166 09/24/2016   HDL  54 09/24/2016   LDLCALC 103 (H) 09/24/2016   TRIG 46 09/24/2016   CHOLHDL 3.1 09/24/2016   Lab Results  Component Value Date   HGBA1C 5.3 09/24/2016   No results found for: [id number] Lab Results  Component Value Date   TSH 1.205 09/24/2016    Lauraine Born, AGNP-C, DNP 08/15/2024, 11:16 AM [facility] [address] [place], [place] [address] [phone]

## 2024-08-15 NOTE — Patient Instructions (Signed)
 Continue Trileptal .  Check labs, EEG.  No driving for 6 months if spells impair awareness.  I will reach out to you when your labs result and we will discuss next apps for medication management.  Follow-up in 6 months.  Please call for any recurrent spells.  Thanks

## 2024-08-16 ENCOUNTER — Telehealth: Payer: Self-pay | Admitting: Neurology

## 2024-08-16 MED ORDER — OXCARBAZEPINE 150 MG PO TABS: ORAL_TABLET | ORAL | 3 refills | Status: DC

## 2024-08-16 NOTE — Telephone Encounter (Signed)
 I called the patient. Sodium level is normal at 139. Will increase Trileptal  to 150 mg AM/300 mg PM for possible seizure activity. Recheck BMP in 1 month. Reviewed plan with Dr. Gregg.   Meds ordered this encounter  Medications   OXcarbazepine  (TRILEPTAL ) 150 MG tablet    Sig: Take 1 tablet in the morning, take 2 at night    Dispense:  270 tablet    Refill:  3

## 2024-08-19 ENCOUNTER — Telehealth: Payer: Self-pay | Admitting: Neurology

## 2024-08-19 LAB — CBC WITH DIFFERENTIAL/PLATELET
Basophils Absolute: 0.1 x10E3/uL (ref 0.0–0.2)
Basos: 1 %
EOS (ABSOLUTE): 0.1 x10E3/uL (ref 0.0–0.4)
Eos: 1 %
Hematocrit: 39.8 % (ref 34.0–46.6)
Hemoglobin: 12.8 g/dL (ref 11.1–15.9)
Immature Grans (Abs): 0 x10E3/uL (ref 0.0–0.1)
Immature Granulocytes: 0 %
Lymphocytes Absolute: 2.8 x10E3/uL (ref 0.7–3.1)
Lymphs: 48 %
MCH: 30 pg (ref 26.6–33.0)
MCHC: 32.2 g/dL (ref 31.5–35.7)
MCV: 93 fL (ref 79–97)
Monocytes Absolute: 0.5 x10E3/uL (ref 0.1–0.9)
Monocytes: 8 %
Neutrophils Absolute: 2.5 x10E3/uL (ref 1.4–7.0)
Neutrophils: 42 %
Platelets: 218 x10E3/uL (ref 150–450)
RBC: 4.26 x10E6/uL (ref 3.77–5.28)
RDW: 11.8 % (ref 11.7–15.4)
WBC: 5.9 x10E3/uL (ref 3.4–10.8)

## 2024-08-19 LAB — OXCARBAZEPINE (TRILEPTAL), SERUM: Oxcarbazepine SerPl-Mcnc: 5 ug/mL — ABNORMAL LOW (ref 10–35)

## 2024-08-19 LAB — COMPREHENSIVE METABOLIC PANEL WITH GFR
ALT: 10 IU/L (ref 0–32)
AST: 16 IU/L (ref 0–40)
Albumin: 3.9 g/dL (ref 3.7–4.7)
Alkaline Phosphatase: 74 IU/L (ref 44–121)
BUN/Creatinine Ratio: 14 (ref 12–28)
BUN: 8 mg/dL (ref 8–27)
Bilirubin Total: 0.2 mg/dL (ref 0.0–1.2)
CO2: 22 mmol/L (ref 20–29)
Calcium: 9.2 mg/dL (ref 8.7–10.3)
Chloride: 103 mmol/L (ref 96–106)
Creatinine, Ser: 0.59 mg/dL (ref 0.57–1.00)
Globulin, Total: 2.4 g/dL (ref 1.5–4.5)
Glucose: 81 mg/dL (ref 70–99)
Potassium: 4.1 mmol/L (ref 3.5–5.2)
Sodium: 139 mmol/L (ref 134–144)
Total Protein: 6.3 g/dL (ref 6.0–8.5)
eGFR: 90 mL/min/1.73 (ref >59)

## 2024-08-19 MED ORDER — OXCARBAZEPINE 150 MG PO TABS: ORAL_TABLET | ORAL | 3 refills | Status: AC

## 2024-08-19 NOTE — Telephone Encounter (Signed)
 Pt called to request medication be sent to different Pharmacy  , Informed that [facility] has already received Medication( OXcarbazepine  (TRILEPTAL ) 150 MG tablet ) . Pt would like medication  transferred to different  Pharmacy   Pt would like medication sent to  [id number] - [place], [place] - [address] Phone: [phone]  Fax: [phone]

## 2024-08-19 NOTE — Telephone Encounter (Signed)
 Sent rx  to [facility] patient aware

## 2024-08-20 ENCOUNTER — Ambulatory Visit: Payer: Self-pay | Admitting: Neurology

## 2024-09-10 ENCOUNTER — Ambulatory Visit (INDEPENDENT_AMBULATORY_CARE_PROVIDER_SITE_OTHER): Admitting: Neurology

## 2024-09-10 ENCOUNTER — Other Ambulatory Visit (INDEPENDENT_AMBULATORY_CARE_PROVIDER_SITE_OTHER): Payer: Self-pay

## 2024-09-10 ENCOUNTER — Telehealth: Payer: Self-pay | Admitting: Neurology

## 2024-09-10 DIAGNOSIS — Z0289 Encounter for other administrative examinations: Secondary | ICD-10-CM

## 2024-09-10 DIAGNOSIS — R55 Syncope and collapse: Secondary | ICD-10-CM

## 2024-09-10 NOTE — Procedures (Signed)
   History:  83 year old woman with syncope and collapse   EEG classification:  Awake and asleep  Duration: 26 minutes   Technical aspects: This EEG study was done with scalp electrodes positioned according to the 10-20 International system of electrode placement. Electrical activity was reviewed with band pass filter of 1-70Hz , sensitivity of 7 uV/mm, display speed of 60mm/sec with a 60Hz  notched filter applied as appropriate. EEG data were recorded continuously and digitally stored.   Description of the recording: The background rhythms of this recording consists of a fairly well modulated medium amplitude background activity of 9-10 Hz. As the record progresses, the patient initially is in the waking state, but appears to enter the early stage II sleep during the recording, with rudimentary sleep spindles and vertex sharp wave activity seen. During the wakeful state, photic stimulation was performed, and no abnormal responses were seen. Hyperventilation was not performed. No epileptiform discharges seen during this recording. There was intermittent left frontotemporal focal slowing.   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 No interictal epileptiform discharges or seizures seen during this recording.    Kemaya Dorner, MD [facility]

## 2024-09-10 NOTE — Telephone Encounter (Signed)
 Please ask her to come to recheck her sodium level with higher dose of Trileptal  in the next few days. Thanks   Orders Placed This Encounter  Procedures   Basic Metabolic Panel

## 2024-09-11 ENCOUNTER — Ambulatory Visit: Payer: Self-pay | Admitting: Neurology

## 2024-09-11 LAB — BASIC METABOLIC PANEL WITH GFR
BUN/Creatinine Ratio: 19 (ref 12–28)
BUN: 11 mg/dL (ref 8–27)
CO2: 24 mmol/L (ref 20–29)
Calcium: 9 mg/dL (ref 8.7–10.3)
Chloride: 101 mmol/L (ref 96–106)
Creatinine, Ser: 0.58 mg/dL (ref 0.57–1.00)
Glucose: 91 mg/dL (ref 70–99)
Potassium: 3.9 mmol/L (ref 3.5–5.2)
Sodium: 137 mmol/L (ref 134–144)
eGFR: 90 mL/min/1.73 (ref >59)

## 2024-09-17 ENCOUNTER — Other Ambulatory Visit: Payer: Self-pay | Admitting: Neurology

## 2024-09-17 NOTE — Telephone Encounter (Signed)
 Received notification in epic that pt did not read mychart. Called pt at [phone].  Relayed results per SS,NP message. She verbalized understanding.   She reports she is back to taking oxcarbazepine  150mg  po BID. Could not tolerate 2 tabs at bedtime. States it made her ankles swell. Feels 150mg  po BID manages sx.   Also saw she requested refill. She does not need refill at this time. Aware we will deny for now but to request when she does need in the future.

## 2024-09-18 NOTE — Telephone Encounter (Signed)
 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 She will try adding in 1/2 tablet of Trileptal  in the PM with the 150 mg dose to see if that is tolerable. I will send myself a reminder in 4 weeks to check on her. BMP was normal 09/10/24 but she wasn't taking the higher dose.

## 2024-10-22 ENCOUNTER — Telehealth: Payer: Self-pay | Admitting: Neurology

## 2024-10-22 DIAGNOSIS — R55 Syncope and collapse: Secondary | ICD-10-CM

## 2024-10-22 NOTE — Telephone Encounter (Signed)
 I called the patient. She is doing well on Trileptal  150 mg AM/225 mg PM (1.5 pills). No further seizure events or side effect. We will recheck BMP to make sure sodium remains fine.   Orders Placed This Encounter  Procedures   Basic Metabolic Panel

## 2025-03-24 ENCOUNTER — Ambulatory Visit: Admitting: Neurology
